# Patient Record
Sex: Male | Born: 1965 | Race: Asian | Hispanic: No | Smoking: Never smoker
Health system: Southern US, Community
[De-identification: ages and names within clinical notes are randomized; demographics above are authoritative.]

## PROBLEM LIST (undated history)

## (undated) DIAGNOSIS — R109 Unspecified abdominal pain: Secondary | ICD-10-CM

## (undated) DIAGNOSIS — R918 Other nonspecific abnormal finding of lung field: Secondary | ICD-10-CM

## (undated) DIAGNOSIS — R0602 Shortness of breath: Secondary | ICD-10-CM

## (undated) DIAGNOSIS — K921 Melena: Secondary | ICD-10-CM

## (undated) DIAGNOSIS — R358 Other polyuria: Secondary | ICD-10-CM

## (undated) DIAGNOSIS — E785 Hyperlipidemia, unspecified: Secondary | ICD-10-CM

## (undated) DIAGNOSIS — E1165 Type 2 diabetes mellitus with hyperglycemia: Secondary | ICD-10-CM

## (undated) DIAGNOSIS — T7840XA Allergy, unspecified, initial encounter: Secondary | ICD-10-CM

## (undated) DIAGNOSIS — Z0000 Encounter for general adult medical examination without abnormal findings: Secondary | ICD-10-CM

## (undated) DIAGNOSIS — C3490 Malignant neoplasm of unspecified part of unspecified bronchus or lung: Secondary | ICD-10-CM

## (undated) DIAGNOSIS — J309 Allergic rhinitis, unspecified: Secondary | ICD-10-CM

## (undated) DIAGNOSIS — R197 Diarrhea, unspecified: Secondary | ICD-10-CM

## (undated) DIAGNOSIS — K759 Inflammatory liver disease, unspecified: Secondary | ICD-10-CM

## (undated) DIAGNOSIS — M79609 Pain in unspecified limb: Secondary | ICD-10-CM

## (undated) DIAGNOSIS — A159 Respiratory tuberculosis unspecified: Secondary | ICD-10-CM

## (undated) DIAGNOSIS — K635 Polyp of colon: Secondary | ICD-10-CM

## (undated) DIAGNOSIS — M1710 Unilateral primary osteoarthritis, unspecified knee: Secondary | ICD-10-CM

## (undated) DIAGNOSIS — Z8619 Personal history of other infectious and parasitic diseases: Secondary | ICD-10-CM

## (undated) HISTORY — PX: COLONOSCOPY: SHX174

## (undated) HISTORY — DX: Unilateral primary osteoarthritis, unspecified knee: M17.10

## (undated) HISTORY — DX: Allergy, unspecified, initial encounter: T78.40XA

## (undated) HISTORY — DX: Malignant neoplasm of unspecified part of unspecified bronchus or lung: C34.90

## (undated) HISTORY — DX: Personal history of other infectious and parasitic diseases: Z86.19

## (undated) HISTORY — DX: Melena: K92.1

## (undated) HISTORY — PX: ESOPHAGOGASTRODUODENOSCOPY: SHX1529

## (undated) HISTORY — DX: Hyperlipidemia, unspecified: E78.5

## (undated) HISTORY — DX: Polyp of colon: K63.5

## (undated) HISTORY — DX: Unspecified abdominal pain: R10.9

## (undated) HISTORY — DX: Encounter for general adult medical examination without abnormal findings: Z00.00

## (undated) HISTORY — DX: Diarrhea, unspecified: R19.7

## (undated) HISTORY — PX: UPPER GASTROINTESTINAL ENDOSCOPY: SHX188

## (undated) HISTORY — PX: POLYPECTOMY: SHX149

## (undated) HISTORY — DX: Allergic rhinitis, unspecified: J30.9

## (undated) HISTORY — DX: Other polyuria: R35.8

## (undated) HISTORY — DX: Type 2 diabetes mellitus with hyperglycemia: E11.65

## (undated) HISTORY — DX: Pain in unspecified limb: M79.609

---

## 2006-03-14 ENCOUNTER — Ambulatory Visit: Payer: Self-pay | Admitting: Internal Medicine

## 2009-02-17 ENCOUNTER — Ambulatory Visit: Payer: Self-pay | Admitting: Internal Medicine

## 2009-02-17 DIAGNOSIS — E785 Hyperlipidemia, unspecified: Secondary | ICD-10-CM

## 2009-02-17 DIAGNOSIS — K921 Melena: Secondary | ICD-10-CM | POA: Insufficient documentation

## 2009-02-17 DIAGNOSIS — R7302 Impaired glucose tolerance (oral): Secondary | ICD-10-CM

## 2009-02-17 DIAGNOSIS — M79609 Pain in unspecified limb: Secondary | ICD-10-CM | POA: Insufficient documentation

## 2009-02-17 DIAGNOSIS — E119 Type 2 diabetes mellitus without complications: Secondary | ICD-10-CM | POA: Insufficient documentation

## 2009-02-17 DIAGNOSIS — R197 Diarrhea, unspecified: Secondary | ICD-10-CM

## 2009-02-17 DIAGNOSIS — IMO0001 Reserved for inherently not codable concepts without codable children: Secondary | ICD-10-CM

## 2009-02-17 HISTORY — DX: Diarrhea, unspecified: R19.7

## 2009-02-17 HISTORY — DX: Reserved for inherently not codable concepts without codable children: IMO0001

## 2009-02-17 HISTORY — DX: Melena: K92.1

## 2009-02-17 HISTORY — DX: Pain in unspecified limb: M79.609

## 2009-02-17 HISTORY — DX: Hyperlipidemia, unspecified: E78.5

## 2009-02-17 LAB — CONVERTED CEMR LAB
ALT: 25 units/L (ref 0–53)
AST: 20 units/L (ref 0–37)
Alkaline Phosphatase: 89 units/L (ref 39–117)
BUN: 17 mg/dL (ref 6–23)
Basophils Relative: 0.9 % (ref 0.0–3.0)
Bilirubin, Direct: 0.1 mg/dL (ref 0.0–0.3)
Creatinine, Ser: 0.9 mg/dL (ref 0.4–1.5)
Eosinophils Relative: 0.6 % (ref 0.0–5.0)
HDL: 53.3 mg/dL (ref >39.00)
INR: 1 (ref 0.8–1.0)
Ketones, ur: NEGATIVE mg/dL
Leukocytes, UA: NEGATIVE
Lymphocytes Relative: 33.3 % (ref 12.0–46.0)
Monocytes Relative: 4.7 % (ref 3.0–12.0)
Neutrophils Relative %: 60.5 % (ref 43.0–77.0)
PSA: 0.49 ng/mL (ref 0.10–4.00)
RBC: 5 M/uL (ref 4.22–5.81)
Specific Gravity, Urine: >=1.03 (ref 1.000–1.030)
TSH: 0.46 microintl units/mL (ref 0.35–5.50)
Total Protein: 7.9 g/dL (ref 6.0–8.3)
Urobilinogen, UA: 0.2 (ref 0.0–1.0)
WBC: 5.4 10*3/uL (ref 4.5–10.5)

## 2009-02-28 ENCOUNTER — Ambulatory Visit: Payer: Self-pay | Admitting: Gastroenterology

## 2009-03-31 ENCOUNTER — Telehealth (INDEPENDENT_AMBULATORY_CARE_PROVIDER_SITE_OTHER): Payer: Self-pay | Admitting: *Deleted

## 2009-04-04 ENCOUNTER — Encounter: Payer: Self-pay | Admitting: Gastroenterology

## 2009-06-09 ENCOUNTER — Telehealth: Payer: Self-pay | Admitting: Internal Medicine

## 2010-03-24 ENCOUNTER — Ambulatory Visit: Payer: Self-pay | Admitting: Internal Medicine

## 2010-03-24 DIAGNOSIS — R358 Other polyuria: Secondary | ICD-10-CM

## 2010-03-24 DIAGNOSIS — R3589 Other polyuria: Secondary | ICD-10-CM

## 2010-03-24 HISTORY — DX: Other polyuria: R35.89

## 2010-03-25 ENCOUNTER — Encounter: Payer: Self-pay | Admitting: Internal Medicine

## 2010-03-25 LAB — CONVERTED CEMR LAB
Calcium: 9.2 mg/dL (ref 8.4–10.5)
Cholesterol: 264 mg/dL — ABNORMAL HIGH (ref 0–200)
GFR calc non Af Amer: 75.84 mL/min (ref >60)
Hemoglobin, Urine: NEGATIVE
Hgb A1c MFr Bld: 6.1 % (ref 4.6–6.5)
Sodium: 143 meq/L (ref 135–145)
Specific Gravity, Urine: >=1.03 (ref 1.000–1.030)
Total Protein, Urine: NEGATIVE mg/dL
Triglycerides: 324 mg/dL — ABNORMAL HIGH (ref 0.0–149.0)
Urine Glucose: 100 mg/dL

## 2010-03-31 ENCOUNTER — Telehealth: Payer: Self-pay | Admitting: Internal Medicine

## 2010-04-24 ENCOUNTER — Ambulatory Visit: Payer: Self-pay | Admitting: Internal Medicine

## 2010-05-14 ENCOUNTER — Encounter: Payer: Self-pay | Admitting: Internal Medicine

## 2010-05-14 ENCOUNTER — Ambulatory Visit: Payer: Self-pay | Admitting: Internal Medicine

## 2010-05-14 DIAGNOSIS — M1710 Unilateral primary osteoarthritis, unspecified knee: Secondary | ICD-10-CM

## 2010-05-14 DIAGNOSIS — IMO0002 Reserved for concepts with insufficient information to code with codable children: Secondary | ICD-10-CM

## 2010-05-14 HISTORY — DX: Reserved for concepts with insufficient information to code with codable children: IMO0002

## 2010-05-19 ENCOUNTER — Telehealth: Payer: Self-pay | Admitting: Internal Medicine

## 2010-06-10 ENCOUNTER — Ambulatory Visit: Admit: 2010-06-10 | Payer: Self-pay | Admitting: Internal Medicine

## 2010-07-07 NOTE — Progress Notes (Signed)
  Phone Note Call from Patient   Caller: Patient---[phone] Call For: Corwin Levins MD Summary of Call: Pt calling to let Dr Jonny Ruiz know about his sugar. 85-95 range for blood sugar before meals, after meals about 140. Does pt need to go to diabetic education? Please advise. Initial call taken by: Verdell Face,  March 31, 2010 11:45 AM  Follow-up for Phone Call        I think it would be fine as he does have mild glucose intolerance with mealtimes;  to help with this, I can given him very low dose januvia at 25 mg, since this would help with his kind of elevated sugars, and does not cause low sugars  done hardcopy to LIM side B - dahlia  Follow-up by: Corwin Levins MD,  March 31, 2010 1:09 PM  Additional Follow-up for Phone Call Additional follow up Details #1::        left message on machine for pt to return my call. Margaret Pyle, CMA  March 31, 2010 1:51 PM  Pt called back and advised of pharmacy. Rx faxed. Additional Follow-up by: Margaret Pyle, CMA,  March 31, 2010 3:11 PM    New/Updated Medications: JANUVIA 25 MG TABS (SITAGLIPTIN PHOSPHATE) 1 by mouth once daily Prescriptions: JANUVIA 25 MG TABS (SITAGLIPTIN PHOSPHATE) 1 by mouth once daily  #90 x 3   Entered and Authorized by:   Corwin Levins MD   Signed by:   Corwin Levins MD on 03/31/2010   Method used:   Print then Give to Patient   RxID:   [id number]

## 2010-07-07 NOTE — Letter (Signed)
Summary: [facility]  [facility]   Imported By: Lester Lucerne 04/01/2010 10:25:14  _____________________________________________________________________  External Attachment:    Type:   Image     Comment:   External Document

## 2010-07-07 NOTE — Progress Notes (Signed)
Summary: RESULTS  Phone Note Call from Patient Call back at Home Phone [phone] Call back at [phone]    Summary of Call: Patient is requesting results of labs from september.  Initial call taken by: Lamar Sprinkles, CMA,  June 09, 2009 4:39 PM  Follow-up for Phone Call        pt informed that labs were all normal and he should follow lower chol diet Follow-up by: Margaret Pyle, CMA,  June 09, 2009 4:42 PM

## 2010-07-07 NOTE — Assessment & Plan Note (Signed)
Summary: glucose 168/frequent urination-lb   Vital Signs:  Patient profile:   45 year old male Height:      63 inches Weight:      149.38 pounds BMI:     26.56 O2 Sat:      95 % on Room air Temp:     98.7 degrees F oral Pulse rate:   102 / minute BP sitting:   102 / 60  (left arm) Cuff size:   regular  Vitals Entered By: Zella Ball Ewing CMA Duncan Dull) (March 24, 2010 1:48 PM)  O2 Flow:  Room air CC: BS elevated, urinating more frequently/RE CBG Result 180   Primary Care Provider:  Sheryle Spray  CC:  BS elevated and urinating more frequently/RE.  History of Present Illness: here with new onset polydipsia, polyuria for 2 wks, cbg this am 180, and last wk with 168 by a realtive's glucometer;  no obvious source of etiology for worsening glucose elevation such as CP, or fever , ST, cough, dysuria, freq, urgency, hematuria.  Pt denies CP, worsening sob, doe, wheezing, orthopnea, pnd, worsening LE edema, palps, dizziness or syncope  Pt denies new neuro symptoms such as headache, facial or extremity weakness  No fever, wt loss, night sweats, loss of appetite or other constitutional symptoms  Pt denies low sugar symptoms such as shakiness improved with eating.  Overall good compliance with meds, trying to follow low chol, DM diet, wt stable, little excercise however .  Had significant nocturia up to 10 times per night, last nigth only 3. No hx of prostatitis  Problems Prior to Update: 1)  Polyuria  (ICD-788.42) 2)  Diabetes Mellitus, Uncontrolled  (ICD-250.02) 3)  Hyperlipidemia  (ICD-272.4) 4)  Leg Pain, Bilateral  (ICD-729.5) 5)  Hematochezia  (ICD-578.1) 6)  Diarrhea  (ICD-787.91) 7)  Preventive Health Care  (ICD-V70.0)  Medications Prior to Update: 1)  Flexeril 5 Mg Tabs (Cyclobenzaprine Hcl) .Marland Kitchen.. 1 By Mouth Three Times A Day As Needed  Current Medications (verified): 1)  Flexeril 5 Mg Tabs (Cyclobenzaprine Hcl) .Marland Kitchen.. 1 By Mouth Three Times A Day As Needed 2)  Janumet 50-500 Mg Tabs  (Sitagliptin-Metformin Hcl) .Marland Kitchen.. 1po Two Times A Day 3)  Aspir-Low 81 Mg Tbec (Aspirin) .Marland Kitchen.. 1po Once Daily 4)  Onetouch Test  Strp (Glucose Blood) .... Use Asd Two Times A Day 5)  Lancets  Misc (Lancets) .... Use Asd Two Times A Day  Allergies (verified): No Known Drug Allergies  Past History:  Past Medical History: Last updated: 02/28/2009 Hyperlipidemia glucose intolerance hx of HepBcAb positive   Past Surgical History: Last updated: 02/28/2009 Denies surgical history   Social History: Last updated: 02/28/2009 Married 4 children work - Chartered certified accountant  - Administrator Never Smoked Alcohol use-yes - rare   Risk Factors: Smoking Status: never (02/17/2009)  Review of Systems       all otherwise negative per pt -    Physical Exam  General:  alert and well-developed.   Head:  normocephalic and atraumatic.   Eyes:  vision grossly intact, pupils equal, and pupils round.   Ears:  R ear normal and L ear normal.   Nose:  no external deformity and no nasal discharge.   Mouth:  no gingival abnormalities and pharynx pink and moist.   Neck:  supple and no masses.   Lungs:  normal respiratory effort and normal breath sounds.   Heart:  normal rate and regular rhythm.   Abdomen:  soft, non-tender, and normal bowel sounds.  Extremities:  no edema, no erythema    Impression & Recommendations:  Problem # 1:  DIABETES MELLITUS, UNCONTROLLED (ICD-250.02)  His updated medication list for this problem includes:    Janumet 50-500 Mg Tabs (Sitagliptin-metformin hcl) .Marland Kitchen... 1po two times a day    Aspir-low 81 Mg Tbec (Aspirin) .Marland Kitchen... 1po once daily uncontrolled, new;  for glucometer/ supplies, refer dietary, treat as above, f/u any worsening signs or symptoms ,  call in 1 wk with cbg's;  f/u dec 2011 with labs as planned, start asa 81 mg once daily   Orders: Diabetic Clinic Referral (Diabetic) TLB-BMP (Basic Metabolic Panel-BMET) (80048-METABOL) TLB-A1C / Hgb A1C (Glycohemoglobin)  (83036-A1C) TLB-Lipid Panel (80061-LIPID)  Problem # 2:  POLYURIA (HWE-993.71) to also check urine studies to r/o underlying UTI as cause of elev sugars, consider urology if neg and symptoms persist Orders: T-Culture, Urine ([id number]) TLB-Udip w/ Micro (81001-URINE)  Problem # 3:  HYPERLIPIDEMIA (ICD-272.4)  stable overall by hx and exam, ok to continue meds/tx as is , Pt to cont DM diet, excercise, wt control efforts; to check labs today   Labs Reviewed: SGOT: 20 (02/17/2009)   SGPT: 25 (02/17/2009)   HDL:53.30 (02/17/2009)  Chol:280 (02/17/2009)  Trig:363.0 (02/17/2009)  Complete Medication List: 1)  Flexeril 5 Mg Tabs (Cyclobenzaprine hcl) .Marland Kitchen.. 1 by mouth three times a day as needed 2)  Janumet 50-500 Mg Tabs (Sitagliptin-metformin hcl) .Marland Kitchen.. 1po two times a day 3)  Aspir-low 81 Mg Tbec (Aspirin) .Marland Kitchen.. 1po once daily 4)  Onetouch Test Strp (Glucose blood) .... Use asd two times a day 5)  Lancets Misc (Lancets) .... Use asd two times a day  Other Orders: Glucose, (CBG) [id number])  Patient Instructions: 1)  start the janumet 50/500 two times a day  2)  Please go to the Lab in the basement for your blood and/or urine tests today  3)  check your sugar two times a day for the next wk (fasting in the AM, and before dinner later) and call with sugar readings in 1 wk 4)  Continue all previous medications as before this visit  5)  Please schedule a follow-up appointment  - in May 14, 2010 as planned with CPX labs and: 6)  HbgA1C prior to visit, ICD-9: 250.02 7)  Urine Microalbumin prior to visit, ICD-9: 8)  Take an Aspirin every day - 81 mg - 1 per day - COATED only - to reduce risk of heart attack and stroke 9)  You will be contacted about the referral(s) to: [facility] 10)  You are given the glucometer and rx for  supplies today Prescriptions: LANCETS  MISC (LANCETS) use asd two times a day  #200 x 11   Entered and Authorized by:   Corwin Levins MD   Signed by:   Corwin Levins MD on  03/24/2010   Method used:   Print then Give to Patient   RxID:   [id number] [facility] TEST  STRP (GLUCOSE BLOOD) use asd two times a day  #200 x 11   Entered and Authorized by:   Corwin Levins MD   Signed by:   Corwin Levins MD on 03/24/2010   Method used:   Print then Give to Patient   RxID:   [id number] JANUMET 50-500 MG TABS (SITAGLIPTIN-METFORMIN HCL) 1po two times a day  #60 x 11   Entered and Authorized by:   Corwin Levins MD   Signed by:   Corwin Levins MD on 03/24/2010  Method used:   Print then Give to Patient   RxID:   [id number]    Orders Added: 1)  Glucose, (CBG) [82962] 2)  Diabetic Clinic Referral [Diabetic] 3)  T-Culture, Urine [[id number]] 4)  TLB-Udip w/ Micro [81001-URINE] 5)  TLB-BMP (Basic Metabolic Panel-BMET) [80048-METABOL] 6)  TLB-A1C / Hgb A1C (Glycohemoglobin) [83036-A1C] 7)  TLB-Lipid Panel [80061-LIPID] 8)  Est. Patient Level IV [30865]    Laboratory Results   Blood Tests     CBG Random:: 180mg /dL

## 2010-07-07 NOTE — Assessment & Plan Note (Signed)
Summary: CPX/#/cd   Vital Signs:  Patient profile:   45 year old male Height:      63 inches Weight:      148.50 pounds BMI:     26.40 O2 Sat:      99 % on Room air Temp:     99.2 degrees F oral Pulse rate:   67 / minute BP sitting:   100 / 62  (left arm) Cuff size:   large  Vitals Entered By: Zella Ball Ewing CMA Duncan Dull) (May 14, 2010 10:19 AM)  O2 Flow:  Room air  CC: Adult Physical/RE   Primary Care Provider:  Corwin Levins MD  CC:  Adult Physical/RE.  History of Present Illness:  here for wellness and f/u;  overall doing ok,  Pt denies CP, worsening sob, doe, wheezing, orthopnea, pnd, worsening LE edema, palps, dizziness or syncope  Pt denies new neuro symptoms such as headache, facial or extremity weakness  Pt denies polydipsia, polyuria, or low sugar symptoms such as shakiness improved with eating.  Overall good compliance with meds, trying to follow low chol, DM diet, wt stable, little excercise however  Denies worsening depressive symptoms, suicidal ideation, or panic.   No fever, wt loss, night sweats, loss of appetite or other constitutional symptoms  Overall good compliance with meds, and good tolerability.  Pt states good ability with ADL's, low fall risk, home safety reviewed and adequate, no significant change in hearing or vision, trying to follow lower chol diet, and occasionally active only with regular excercise.   Preventive Screening-Counseling & Management      Drug Use:  no.    Problems Prior to Update: 1)  Osteoarthritis, Knees, Bilateral  (ICD-715.96) 2)  Polyuria  (ICD-788.42) 3)  Diabetes Mellitus, Uncontrolled  (ICD-250.02) 4)  Hyperlipidemia  (ICD-272.4) 5)  Leg Pain, Bilateral  (ICD-729.5) 6)  Hematochezia  (ICD-578.1) 7)  Diarrhea  (ICD-787.91) 8)  Preventive Health Care  (ICD-V70.0)  Medications Prior to Update: 1)  Flexeril 5 Mg Tabs (Cyclobenzaprine Hcl) .Marland Kitchen.. 1 By Mouth Three Times A Day As Needed 2)  Januvia 25 Mg Tabs (Sitagliptin  Phosphate) .Marland Kitchen.. 1 By Mouth Once Daily 3)  Aspir-Low 81 Mg Tbec (Aspirin) .Marland Kitchen.. 1po Once Daily 4)  Onetouch Test  Strp (Glucose Blood) .... Use Asd Two Times A Day 5)  Lancets  Misc (Lancets) .... Use Asd Two Times A Day  Current Medications (verified): 1)  Aspir-Low 81 Mg Tbec (Aspirin) .Marland Kitchen.. 1po Once Daily 2)  Onetouch Test  Strp (Glucose Blood) .... Use Asd Two Times A Day 3)  Lancets  Misc (Lancets) .... Use Asd Two Times A Day 4)  Lipitor 10 Mg Tabs (Atorvastatin Calcium) .Marland Kitchen.. 1 By Mouth Once Daily  - Generic  Allergies (verified): No Known Drug Allergies  Past History:  Past Surgical History: Last updated: 02/28/2009 Denies surgical history   Family History: Last updated: 02/28/2009 FH largely unknown as parents killed by [facility] in [place] when he was 45yo   Social History: Last updated: 05/14/2010 Married 4 children work - Chartered certified accountant  - Administrator Never Smoked Alcohol use-yes - rare  Drug use-no  Risk Factors: Smoking Status: never (02/17/2009)  Past Medical History: Hyperlipidemia glucose intolerance hx of HepBcAb positive  bilateral knee DJD - minor, 2011  Social History: Married 4 children work - Chartered certified accountant  - Administrator Never Smoked Alcohol use-yes - rare  Drug use-no Drug Use:  no  Review of Systems  The patient denies anorexia,  fever, vision loss, decreased hearing, hoarseness, chest pain, syncope, dyspnea on exertion, peripheral edema, prolonged cough, headaches, hemoptysis, abdominal pain, melena, hematochezia, severe indigestion/heartburn, hematuria, muscle weakness, suspicious skin lesions, transient blindness, difficulty walking, depression, unusual weight change, abnormal bleeding, enlarged lymph nodes, and angioedema.         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   Head:  normocephalic and atraumatic.    Eyes:  vision grossly intact, pupils equal, and pupils round.   Ears:  R ear normal and L ear normal.   Nose:  no external deformity and no nasal discharge.   Mouth:  no gingival abnormalities and pharynx pink and moist.   Neck:  supple and no masses.   Lungs:  normal respiratory effort and normal breath sounds.   Heart:  normal rate and regular rhythm.   Abdomen:  soft, non-tender, and normal bowel sounds.   Msk:  no joint tenderness and no joint swelling.  , has bilat mild knee crepitus Extremities:  no edema, no erythema  Neurologic:  cranial nerves II-XII intact and strength normal in all extremities.   Skin:  color normal and no suspicious lesions.   Psych:  not depressed appearing and slightly anxious.     Impression & Recommendations:  Problem # 1:  Preventive Health Care (ICD-V70.0) Overall doing well, age appropriate education and counseling updated, referral for preventive services and immunizations addressed, dietary counseling and smoking status adressed , most recent labs reviewed, ecg reviewed I have personally reviewed and have noted 1.The patient's medical and social history 2.Their use of alcohol, tobacco or illicit drugs 3.Their current medications and supplements 4. Functional ability including ADL's, fall risk, home safety risk, hearing & visual impairment  5.Diet and physical activities 6.Evidence for depression or mood disorders The patients weight, height, BMI  have been recorded in the chart I have made referrals, counseling and provided education to the patient based review of the above  To check further labs with next  blood draw Orders: EKG w/ Interpretation (93000)  Problem # 2:  OSTEOARTHRITIS, KNEES, BILATERAL (ICD-715.96)  The following medications were removed from the medication list:    Flexeril 5 Mg Tabs (Cyclobenzaprine hcl) .Marland Kitchen... 1 by mouth three times a day as needed His updated medication list for this problem includes:    Aspir-low 81 Mg  Tbec (Aspirin) .Marland Kitchen... 1po once daily minor, for tylenol arthritis as needed   Problem # 3:  DIABETES MELLITUS, UNCONTROLLED (ICD-250.02) Assessment: Unchanged  The following medications were removed from the medication list:    Januvia 25 Mg Tabs (Sitagliptin phosphate) .Marland Kitchen... 1 by mouth once daily His updated medication list for this problem includes:    Aspir-low 81 Mg Tbec (Aspirin) .Marland Kitchen... 1po once daily no need for OHA at this time, pt educated and reassured  Labs Reviewed: Creat: 1.1 (03/24/2010)    Reviewed HgBA1c results: 6.1 (03/24/2010)  Problem # 4:  HYPERLIPIDEMIA (ICD-272.4) Assessment: Deteriorated  His updated medication list for this problem includes:    Lipitor 10 Mg Tabs (Atorvastatin calcium) .Marland Kitchen... 1 by mouth once daily  - generic  Labs Reviewed: SGOT: 20 (02/17/2009)   SGPT: 25 (02/17/2009)   HDL:55.10 (03/24/2010), 53.30 (02/17/2009)  Chol:264 (03/24/2010), 280 (02/17/2009)  Trig:324.0 (03/24/2010), 363.0 (02/17/2009) start med as above, for f/u labs 4 wks  Complete Medication List: 1)  Aspir-low 81  Mg Tbec (Aspirin) .Marland Kitchen.. 1po once daily 2)  Onetouch Test Strp (Glucose blood) .... Use asd two times a day 3)  Lancets Misc (Lancets) .... Use asd two times a day 4)  Lipitor 10 Mg Tabs (Atorvastatin calcium) .Marland Kitchen.. 1 by mouth once daily  - generic  Patient Instructions: 1)  please return in 4 wks for LAB only: 2)  Hepatic Panel  ICD-9: v58.69 3)  Lipid Panel , ICD-9: 272.0 4)  PSA , ICD-9: v70.0 5)  TSH, ICD-9:v70.0 6)  CBC w/ Diff , ICD-9: v70.0 7)  start the lipitor generic at 10mg  per day (sent to your pharmacy) 8)  Continue all previous medications as before this visit  9)  You can also take Tylenol arthritis (1 or 2 pills every 8 hrs as needed pain such as at work) 10)  Please schedule a follow-up appointment in 6 months with: 11)  BMP prior to visit, ICD-9: 250.02 12)  Lipid Panel prior to visit, ICD-9: 13)  HbgA1C prior to visit, ICD-9: 14)  Your Blood  Pressure today was 100/62, heart rate 67, wt 148.8 Prescriptions: LIPITOR 10 MG TABS (ATORVASTATIN CALCIUM) 1 by mouth once daily  - generic  #90 x 3   Entered and Authorized by:   Corwin Levins MD   Signed by:   Corwin Levins MD on 05/14/2010   Method used:   Electronically to        [facility] [id number]* (retail)       [address]       [place], [place]  [address]       Ph: [phone]       Fax: [phone]   RxID:   [id number]    Orders Added: 1)  EKG w/ Interpretation [93000] 2)  Est. Patient 40-64 years [phone]

## 2010-07-09 NOTE — Progress Notes (Signed)
Summary: Diabetic referral  Phone Note Outgoing Call   Summary of Call: Per [facility] pt called and cancelled appt and elected not to reschedule. Initial call taken by: Dagoberto Reef,  May 19, 2010 2:47 PM  Follow-up for Phone Call        noted Follow-up by: Corwin Levins MD,  May 19, 2010 3:39 PM

## 2010-11-13 ENCOUNTER — Other Ambulatory Visit: Payer: Self-pay

## 2010-11-13 ENCOUNTER — Other Ambulatory Visit: Payer: Self-pay | Admitting: Internal Medicine

## 2010-11-13 DIAGNOSIS — I10 Essential (primary) hypertension: Secondary | ICD-10-CM

## 2010-11-13 DIAGNOSIS — T887XXA Unspecified adverse effect of drug or medicament, initial encounter: Secondary | ICD-10-CM

## 2010-11-15 ENCOUNTER — Encounter: Payer: Self-pay | Admitting: Internal Medicine

## 2010-11-15 DIAGNOSIS — Z0001 Encounter for general adult medical examination with abnormal findings: Secondary | ICD-10-CM | POA: Insufficient documentation

## 2010-11-15 DIAGNOSIS — Z0000 Encounter for general adult medical examination without abnormal findings: Secondary | ICD-10-CM

## 2010-11-15 HISTORY — DX: Encounter for general adult medical examination without abnormal findings: Z00.00

## 2010-11-17 ENCOUNTER — Other Ambulatory Visit (INDEPENDENT_AMBULATORY_CARE_PROVIDER_SITE_OTHER): Payer: Self-pay

## 2010-11-17 ENCOUNTER — Other Ambulatory Visit: Payer: Self-pay | Admitting: Internal Medicine

## 2010-11-17 DIAGNOSIS — I10 Essential (primary) hypertension: Secondary | ICD-10-CM

## 2010-11-17 DIAGNOSIS — T887XXA Unspecified adverse effect of drug or medicament, initial encounter: Secondary | ICD-10-CM

## 2010-11-17 LAB — HEPATIC FUNCTION PANEL
ALT: 22 U/L (ref 0–53)
Bilirubin, Direct: 0 mg/dL (ref 0.0–0.3)
Total Bilirubin: 0.4 mg/dL (ref 0.3–1.2)

## 2010-11-17 LAB — BASIC METABOLIC PANEL
Calcium: 9.4 mg/dL (ref 8.4–10.5)
Chloride: 102 mEq/L (ref 96–112)
Creatinine, Ser: 1 mg/dL (ref 0.4–1.5)
Sodium: 136 mEq/L (ref 135–145)

## 2010-11-17 LAB — CBC WITH DIFFERENTIAL/PLATELET
Eosinophils Relative: 1.6 % (ref 0.0–5.0)
Lymphocytes Relative: 38.6 % (ref 12.0–46.0)
Monocytes Relative: 5.5 % (ref 3.0–12.0)
Neutrophils Relative %: 54 % (ref 43.0–77.0)
Platelets: 273 10*3/uL (ref 150.0–400.0)
WBC: 5.9 10*3/uL (ref 4.5–10.5)

## 2010-11-17 LAB — LIPID PANEL
HDL: 54 mg/dL (ref >39.00)
Total CHOL/HDL Ratio: 5
Triglycerides: 318 mg/dL — ABNORMAL HIGH (ref 0.0–149.0)

## 2010-11-17 LAB — HEMOGLOBIN A1C: Hgb A1c MFr Bld: 6.3 % (ref 4.6–6.5)

## 2010-11-17 LAB — LDL CHOLESTEROL, DIRECT: Direct LDL: 146.3 mg/dL

## 2010-11-20 ENCOUNTER — Encounter: Payer: Self-pay | Admitting: Internal Medicine

## 2010-11-20 ENCOUNTER — Ambulatory Visit (INDEPENDENT_AMBULATORY_CARE_PROVIDER_SITE_OTHER): Payer: PRIVATE HEALTH INSURANCE | Admitting: Internal Medicine

## 2010-11-20 VITALS — BP 102/68 | HR 76 | Temp 98.7°F | Ht 63.0 in | Wt 149.2 lb

## 2010-11-20 DIAGNOSIS — E785 Hyperlipidemia, unspecified: Secondary | ICD-10-CM

## 2010-11-20 DIAGNOSIS — Z0000 Encounter for general adult medical examination without abnormal findings: Secondary | ICD-10-CM

## 2010-11-20 MED ORDER — LANCETS MISC: Status: DC

## 2010-11-20 MED ORDER — GLUCOSE BLOOD VI STRP: ORAL_STRIP | Status: DC

## 2010-11-20 MED ORDER — SIMVASTATIN 20 MG PO TABS: 20.0000 mg | ORAL_TABLET | Freq: Every evening | ORAL | Status: DC

## 2010-11-20 NOTE — Assessment & Plan Note (Signed)
stable overall by hx and exam, most recent data reviewed with pt, and pt to continue medical treatment as before  Lab Results  Component Value Date   HGBA1C 6.3 11/17/2010   No OHA needed at this time, cont diet, for new glucometer today and rx for supplies

## 2010-11-20 NOTE — Patient Instructions (Signed)
You are given the new glucometer machine today Stop the lipitor as you have Start the generic for zocor 20 mg Continue all other medications as before Please return in 6 mo with Lab testing done 3-5 days before

## 2010-11-20 NOTE — Assessment & Plan Note (Signed)
To d/c the lipitor, though not clear it was the etiology for the chest crampy pains;  Ok to change to zocor asd, f/u labs

## 2010-11-20 NOTE — Assessment & Plan Note (Signed)

## 2010-11-20 NOTE — Progress Notes (Signed)
Subjective:    Patient ID: Nicolas Cantu, male    DOB: 1966/06/05, 45 y.o.   MRN: [id number]  HPI  Here for wellness and f/u;  Overall doing ok;  Pt denies CP, worsening SOB, DOE, wheezing, orthopnea, PND, worsening LE edema, palpitations, dizziness or syncope.  Pt denies neurological change such as new Headache, facial or extremity weakness.  Pt denies polydipsia, polyuria, or low sugar symptoms. Pt states overall good compliance with treatment and medications, good tolerability, and trying to follow lower cholesterol diet.  Pt denies worsening depressive symptoms, suicidal ideation or panic. No fever, wt loss, night sweats, loss of appetite, or other constitutional symptoms.  Pt states good ability with ADL's, low fall risk, home safety reviewed and adequate, no significant changes in hearing or vision, and occasionally active with exercise.  Did stop his lipitor 3 wks ago after having crampy lateral sharp chest discomfort at work - works as Location manager. Past Medical History  Diagnosis Date  . DIABETES MELLITUS, UNCONTROLLED 02/17/2009  . Diarrhea 02/17/2009  . HEMATOCHEZIA 02/17/2009  . HYPERLIPIDEMIA 02/17/2009  . LEG PAIN, BILATERAL 02/17/2009  . OSTEOARTHRITIS, KNEES, BILATERAL 05/14/2010  . Polyuria 03/24/2010  . Preventative health care 11/15/2010   No past surgical history on file.  reports that he has never smoked. He does not have any smokeless tobacco history on file. He reports that he drinks alcohol. He reports that he does not use illicit drugs. family history is not on file. No Known Allergies Current Outpatient Prescriptions on File Prior to Visit  Medication Sig Dispense Refill  . aspirin 81 MG EC tablet Take 81 mg by mouth daily.        Marland Kitchen DISCONTD: atorvastatin (LIPITOR) 10 MG tablet Take 10 mg by mouth daily.        Marland Kitchen DISCONTD: glucose blood (ONE TOUCH TEST STRIPS) test strip Use as directed two times a day       . DISCONTD: Lancets MISC Test as directed two times a day          Review of Systems Review of Systems  Constitutional: Negative for diaphoresis, activity change, appetite change and unexpected weight change.  HENT: Negative for hearing loss, ear pain, facial swelling, mouth sores and neck stiffness.   Eyes: Negative for pain, redness and visual disturbance.  Respiratory: Negative for shortness of breath and wheezing.   Cardiovascular: Negative for chest pain and palpitations.  Gastrointestinal: Negative for diarrhea, blood in stool, abdominal distention and rectal pain.  Genitourinary: Negative for hematuria, flank pain and decreased urine volume.  Musculoskeletal: Negative for myalgias and joint swelling.  Skin: Negative for color change and wound.  Neurological: Negative for syncope and numbness.  Hematological: Negative for adenopathy.  Psychiatric/Behavioral: Negative for hallucinations, self-injury, decreased concentration and agitation.      Objective:   Physical Exam BP 102/68  Pulse 76  Temp(Src) 98.7 F (37.1 C) (Oral)  Ht 5\' 3"  (1.6 m)  Wt 149 lb 4 oz (67.699 kg)  BMI 26.44 kg/m2  SpO2 96% Physical Exam  VS noted Constitutional: Pt is oriented to person, place, and time. Appears well-developed and well-nourished.  HENT:  Head: Normocephalic and atraumatic.  Right Ear: External ear normal.  Left Ear: External ear normal.  Nose: Nose normal.  Mouth/Throat: Oropharynx is clear and moist.  Eyes: Conjunctivae and EOM are normal. Pupils are equal, round, and reactive to light.  Neck: Normal range of motion. Neck supple. No JVD present. No tracheal deviation present.  Cardiovascular:  Normal rate, regular rhythm, normal heart sounds and intact distal pulses.   Pulmonary/Chest: Effort normal and breath sounds normal.  Abdominal: Soft. Bowel sounds are normal. There is no tenderness.  Musculoskeletal: Normal range of motion. Exhibits no edema.  Lymphadenopathy:  Has no cervical adenopathy.  Neurological: Pt is alert and oriented to  person, place, and time. Pt has normal reflexes. No cranial nerve deficit.  Skin: Skin is warm and dry. No rash noted.  Psychiatric:  Has  normal mood and affect. Behavior is normal.         Assessment & Plan:

## 2011-01-07 ENCOUNTER — Encounter: Payer: Self-pay | Admitting: Internal Medicine

## 2011-01-07 ENCOUNTER — Ambulatory Visit (INDEPENDENT_AMBULATORY_CARE_PROVIDER_SITE_OTHER): Payer: PRIVATE HEALTH INSURANCE | Admitting: Internal Medicine

## 2011-01-07 DIAGNOSIS — J209 Acute bronchitis, unspecified: Secondary | ICD-10-CM

## 2011-01-07 DIAGNOSIS — R062 Wheezing: Secondary | ICD-10-CM | POA: Insufficient documentation

## 2011-01-07 MED ORDER — PREDNISONE 10 MG PO TABS: 10.0000 mg | ORAL_TABLET | Freq: Every day | ORAL | Status: AC

## 2011-01-07 MED ORDER — AZITHROMYCIN 250 MG PO TABS: ORAL_TABLET | ORAL | Status: AC

## 2011-01-07 MED ORDER — HYDROCODONE-HOMATROPINE 5-1.5 MG/5ML PO SYRP: 5.0000 mL | ORAL_SOLUTION | Freq: Four times a day (QID) | ORAL | Status: AC | PRN

## 2011-01-07 NOTE — Patient Instructions (Addendum)
Take all new medications as prescribed Continue all other medications as before Please be aware that the prednisone will help the breathing, but can make the blood sugars increased mildy (but sugars go back to normal after the treatment)

## 2011-01-10 ENCOUNTER — Encounter: Payer: Self-pay | Admitting: Internal Medicine

## 2011-01-10 NOTE — Assessment & Plan Note (Signed)
Mild, for predpack asd,  to f/u any worsening symptoms or concerns  

## 2011-01-10 NOTE — Progress Notes (Signed)
  Subjective:    Patient ID: Nicolas Cantu, male    DOB: [birth year], 45 y.o.   MRN: [id number]  HPI Here with acute onset mild to mod 6-7 ST, HA, general weakness and malaise, with prod cough greenish sputum, but Pt denies chest pain, increased sob or doe,  orthopnea, PND, increased LE swelling, palpitations, dizziness or syncope, but has mild wheezing onset today.  Pt denies polydipsia, polyuria, or low sugar symptoms such as weakness or confusion improved with po intake.  Pt states overall good compliance with meds, trying to follow lower cholesterol, diabetic diet, wt overall stable but little exercise however.    Past Medical History  Diagnosis Date  . DIABETES MELLITUS, UNCONTROLLED 02/17/2009  . Diarrhea 02/17/2009  . HEMATOCHEZIA 02/17/2009  . HYPERLIPIDEMIA 02/17/2009  . LEG PAIN, BILATERAL 02/17/2009  . OSTEOARTHRITIS, KNEES, BILATERAL 05/14/2010  . Polyuria 03/24/2010  . Preventative health care 11/15/2010   No past surgical history on file.  reports that he has never smoked. He does not have any smokeless tobacco history on file. He reports that he drinks alcohol. He reports that he does not use illicit drugs. family history is not on file. No Known Allergies Current Outpatient Prescriptions on File Prior to Visit  Medication Sig Dispense Refill  . aspirin 81 MG EC tablet Take 81 mg by mouth daily.        Marland Kitchen glucose blood (ONE TOUCH TEST STRIPS) test strip Use as directed two times a day  100 each  3  . Lancets MISC Test as directed once per day  100 each  3  . simvastatin (ZOCOR) 20 MG tablet Take 1 tablet (20 mg total) by mouth every evening.  90 tablet  3   Review of Systems Review of Systems  Constitutional: Negative for diaphoresis and unexpected weight change.  HENT: Negative for drooling and tinnitus.   Eyes: Negative for photophobia and visual disturbance.  Respiratory: Negative for choking and stridor.   Gastrointestinal: Negative for vomiting and blood in stool.    Genitourinary: Negative for hematuria and decreased urine volume.        Objective:   Physical Exam BP 110/72  Pulse 77  Temp(Src) 98.6 F (37 C) (Oral)  Ht 5\' 3"  (1.6 m)  Wt 151 lb 8 oz (68.72 kg)  BMI 26.84 kg/m2  SpO2 94% Physical Exam  VS noted, mild ill Constitutional: Pt appears well-developed and well-nourished.  HENT: Head: Normocephalic.  Right Ear: External ear normal.  Left Ear: External ear normal.  Bilat tm's mild erythema.  Sinus nontender.  Pharynx mild erythema Eyes: Conjunctivae and EOM are normal. Pupils are equal, round, and reactive to light.  Neck: Normal range of motion. Neck supple.  Cardiovascular: Normal rate and regular rhythm.   Pulmonary/Chest: Effort normal and breath sounds bilat decreased, midl wheezes.  Abd:  Soft, NT, non-distended, + BS Neurological: Pt is alert. No cranial nerve deficit.  Skin: Skin is warm. No erythema.  Psychiatric: Pt behavior is normal. Thought content normal.         Assessment & Plan:

## 2011-01-10 NOTE — Assessment & Plan Note (Addendum)
stable overall by hx and exam, most recent data reviewed with pt, and pt to continue medical treatment as before;  To call with onset polys or cbg > 200 on steroid tx  Lab Results  Component Value Date   HGBA1C 6.3 11/17/2010

## 2011-01-10 NOTE — Assessment & Plan Note (Signed)
Mild to mod, for antibx course,  to f/u any worsening symptoms or concerns 

## 2011-02-02 ENCOUNTER — Other Ambulatory Visit: Payer: Self-pay | Admitting: Internal Medicine

## 2011-02-26 ENCOUNTER — Encounter: Payer: Self-pay | Admitting: Internal Medicine

## 2011-02-26 ENCOUNTER — Ambulatory Visit (INDEPENDENT_AMBULATORY_CARE_PROVIDER_SITE_OTHER): Payer: PRIVATE HEALTH INSURANCE | Admitting: Internal Medicine

## 2011-02-26 ENCOUNTER — Ambulatory Visit (INDEPENDENT_AMBULATORY_CARE_PROVIDER_SITE_OTHER)
Admission: RE | Admit: 2011-02-26 | Discharge: 2011-02-26 | Disposition: A | Discharge status: Home / Self Care | Payer: PRIVATE HEALTH INSURANCE | Source: Ambulatory Visit | Attending: Internal Medicine | Admitting: Internal Medicine

## 2011-02-26 DIAGNOSIS — J984 Other disorders of lung: Secondary | ICD-10-CM

## 2011-02-26 DIAGNOSIS — J45909 Unspecified asthma, uncomplicated: Secondary | ICD-10-CM | POA: Insufficient documentation

## 2011-02-26 DIAGNOSIS — R062 Wheezing: Secondary | ICD-10-CM

## 2011-02-26 DIAGNOSIS — R05 Cough: Secondary | ICD-10-CM

## 2011-02-26 DIAGNOSIS — IMO0001 Reserved for inherently not codable concepts without codable children: Secondary | ICD-10-CM

## 2011-02-26 DIAGNOSIS — J309 Allergic rhinitis, unspecified: Secondary | ICD-10-CM

## 2011-02-26 DIAGNOSIS — R911 Solitary pulmonary nodule: Secondary | ICD-10-CM | POA: Insufficient documentation

## 2011-02-26 DIAGNOSIS — R059 Cough, unspecified: Secondary | ICD-10-CM

## 2011-02-26 HISTORY — DX: Allergic rhinitis, unspecified: J30.9

## 2011-02-26 MED ORDER — MONTELUKAST SODIUM 10 MG PO TABS: 10.0000 mg | ORAL_TABLET | Freq: Every day | ORAL | Status: DC

## 2011-02-26 MED ORDER — FLUTICASONE-SALMETEROL 250-50 MCG/DOSE IN AEPB: 1.0000 | INHALATION_SPRAY | Freq: Two times a day (BID) | RESPIRATORY_TRACT | Status: DC

## 2011-02-26 MED ORDER — METHYLPREDNISOLONE ACETATE 80 MG/ML IJ SUSP
120.0000 mg | Freq: Once | INTRAMUSCULAR | Status: AC
  Administered 2011-02-26: 120 mg via INTRAMUSCULAR

## 2011-02-26 MED ORDER — FLUTICASONE PROPIONATE 50 MCG/ACT NA SUSP: 2.0000 | Freq: Every day | NASAL | Status: DC

## 2011-02-26 NOTE — Assessment & Plan Note (Signed)
stable overall by hx and exam, most recent data reviewed with pt, and pt to continue medical treatment as before  Lab Results  Component Value Date   HGBA1C 6.3 11/17/2010

## 2011-02-26 NOTE — Patient Instructions (Addendum)
You had the steroid shot today for allergies and asthma symptoms Take all new medications as prescribed - the generic flonase for nasal allergy treatment, advair 1 puff twice per day for wheezing/shortness of breath, and generic singulair 10 mg per day (for both)  - ALL sent to the pharmacy (and advair given as sample) Continue all other medications as before Please go to XRAY in the Basement for the x-ray test Please call the phone number [phone] (the PhoneTree System) for results of testing in 2-3 days;  When calling, simply dial the number, and when prompted enter the MRN number above (the Medical Record Number) and the # key, then the message should start.

## 2011-02-26 NOTE — Progress Notes (Signed)
  Subjective:    Patient ID: Nicolas Cantu, male    DOB: 1965/11/07, 45 y.o.   MRN: [id number]  HPI  Here to f/u after initialy improving with tx last visit for acute bronchitis/wheezing;  Unfortunately since about a wk after last visit he has had primarily persistent cough, with some thigh sputum in the AM only, which has only seemed to become wore in the past few wks, now some worse at night at well keeping him up and disrupting sleep;  Pt denies chest pain, orthopnea, PND, increased LE swelling, palpitations, dizziness or syncope but has some ? Wheezing, mid chest fullness and mild sob.  Pt denies new neurological symptoms such as new headache, or facial or extremity weakness or numbness  . Pt denies polydipsia, polyuria.  Has had some mild nasal allergy type symtpoms as well with ? Post nasal gtt, tickling back of throat.  Pt denies fever, wt loss, night sweats, loss of appetite, or other constitutional symptoms Past Medical History  Diagnosis Date  . DIABETES MELLITUS, UNCONTROLLED 02/17/2009  . Diarrhea 02/17/2009  . HEMATOCHEZIA 02/17/2009  . HYPERLIPIDEMIA 02/17/2009  . LEG PAIN, BILATERAL 02/17/2009  . OSTEOARTHRITIS, KNEES, BILATERAL 05/14/2010  . Polyuria 03/24/2010  . Preventative health care 11/15/2010  . Allergic rhinitis, cause unspecified 02/26/2011  . Asthma 02/26/2011   No past surgical history on file.  reports that he has never smoked. He does not have any smokeless tobacco history on file. He reports that he drinks alcohol. He reports that he does not use illicit drugs. family history is not on file. No Known Allergies Current Outpatient Prescriptions on File Prior to Visit  Medication Sig Dispense Refill  . aspirin 81 MG EC tablet Take 81 mg by mouth daily.        Marland Kitchen glucose blood (ONE TOUCH TEST STRIPS) test strip Use as directed two times a day  100 each  3  . Lancets MISC Test as directed once per day  100 each  3  . simvastatin (ZOCOR) 20 MG tablet Take 1 tablet (20 mg total)  by mouth every evening.  90 tablet  3   No current facility-administered medications on file prior to visit.   Review of Systems Review of Systems  Constitutional: Negative for diaphoresis and unexpected weight change.  HENT: Negative for drooling and tinnitus.   Eyes: Negative for photophobia and visual disturbance.  Respiratory: Negative for choking and stridor.   Gastrointestinal: Negative for vomiting and blood in stool.  Genitourinary: Negative for hematuria and decreased urine volume.       Objective:   Physical Exam BP 100/62  Pulse 69  Temp(Src) 98.1 F (36.7 C) (Oral)  Ht 5\' 3"  (1.6 m)  Wt 150 lb (68.04 kg)  BMI 26.57 kg/m2  SpO2 95% Physical Exam  VS noted, not ill appearing Constitutional: Pt appears well-developed and well-nourished.  HENT: Head: Normocephalic.  Right Ear: External ear normal.  Left Ear: External ear normal.  Left tm's mild erythema.  Sinus nontender.  Pharynx mild erythema Eyes: Conjunctivae and EOM are normal. Pupils are equal, round, and reactive to light.  Neck: Normal range of motion. Neck supple.  Cardiovascular: Normal rate and regular rhythm.   Pulmonary/Chest: Effort normal and breath sounds normal.  Neurological: Pt is alert. No cranial nerve deficit.  Skin: Skin is warm. No erythema.  Psychiatric: Pt behavior is normal. Thought content normal. not nervous appearing        Assessment & Plan:

## 2011-02-26 NOTE — Assessment & Plan Note (Signed)
Most likely related to asthma and post nasal gtt - for cxr to r/o other -

## 2011-02-26 NOTE — Assessment & Plan Note (Signed)
Mild to mod, likely with post nasal gtt and some element of the cough - for singulair and flonase asd,  to f/u any worsening symptoms or concerns

## 2011-02-26 NOTE — Assessment & Plan Note (Signed)
Likely asthma exac, allergy related seasonal I suspect - for depomedrol IM, advair asd, and singular qd

## 2011-03-03 ENCOUNTER — Other Ambulatory Visit: Payer: Self-pay | Admitting: Internal Medicine

## 2011-03-03 ENCOUNTER — Ambulatory Visit (INDEPENDENT_AMBULATORY_CARE_PROVIDER_SITE_OTHER)
Admission: RE | Admit: 2011-03-03 | Discharge: 2011-03-03 | Disposition: A | Discharge status: Home / Self Care | Payer: PRIVATE HEALTH INSURANCE | Source: Ambulatory Visit | Attending: Internal Medicine | Admitting: Internal Medicine

## 2011-03-03 DIAGNOSIS — R911 Solitary pulmonary nodule: Secondary | ICD-10-CM

## 2011-03-03 DIAGNOSIS — J984 Other disorders of lung: Secondary | ICD-10-CM

## 2011-03-18 ENCOUNTER — Institutional Professional Consult (permissible substitution): Payer: PRIVATE HEALTH INSURANCE | Admitting: Pulmonary Disease

## 2011-03-24 ENCOUNTER — Encounter: Payer: Self-pay | Admitting: Pulmonary Disease

## 2011-03-24 ENCOUNTER — Ambulatory Visit (INDEPENDENT_AMBULATORY_CARE_PROVIDER_SITE_OTHER): Payer: PRIVATE HEALTH INSURANCE | Admitting: Pulmonary Disease

## 2011-03-24 VITALS — BP 102/64 | HR 91 | Temp 98.2°F | Ht 63.0 in | Wt 149.8 lb

## 2011-03-24 DIAGNOSIS — R911 Solitary pulmonary nodule: Secondary | ICD-10-CM

## 2011-03-24 DIAGNOSIS — J984 Other disorders of lung: Secondary | ICD-10-CM

## 2011-03-24 NOTE — Patient Instructions (Signed)
Will schedule for PET scan, to get closer look at your "spot".  Will call you when the results are available.

## 2011-03-24 NOTE — Progress Notes (Signed)
  Subjective:    Patient ID: Nicolas Cantu, male    DOB: Dec 30, 1965, 45 y.o.   MRN: [id number]  HPI The patient is a 45 year old male who I've been asked to see for a left upper lobe pulmonary nodule.  He recently had a chest x-ray which demonstrated an abnormality in the left upper lobe, and a CT chest and verified a 1.2 cm density in the left upper lobe.  There was no significant lymphadenopathy or other abnormality.  The patient states that he has never had a chest x-ray prior to this, and is originally from [place].  He is unsure if he ever received BCG, but he did take "medicine for tuberculosis" when he entered the [place].  The patient is unsure of his PPD status.  He has never lived in [place] or [address].  He is eating well, and not losing weight.  He has not had any hemoptysis.  Patient does work as a Chartered certified accountant, and works with [employer], community.  However, he denies any significant particulate exposure, and does not do any type of grinding.   Review of Systems  Constitutional: Negative for fever and unexpected weight change.  HENT: Negative for ear pain, nosebleeds, congestion, sore throat, rhinorrhea, sneezing, trouble swallowing, dental problem, postnasal drip and sinus pressure.   Eyes: Negative for redness and itching.  Respiratory: Positive for cough and shortness of breath. Negative for chest tightness and wheezing.   Cardiovascular: Negative for palpitations and leg swelling.  Gastrointestinal: Negative for nausea and vomiting.  Genitourinary: Negative for dysuria.  Musculoskeletal: Negative for joint swelling.  Skin: Negative for rash.  Neurological: Negative for headaches.  Hematological: Does not bruise/bleed easily.  Psychiatric/Behavioral: Negative for dysphoric mood. The patient is not nervous/anxious.        Objective:   Physical Exam Constitutional:  Well developed, no acute distress  HENT:  Nares patent without discharge  Oropharynx without exudate, palate and  uvula are normal  Eyes:  Perrla, eomi, no scleral icterus  Neck:  No JVD, no TMG  Cardiovascular:  Normal rate, regular rhythm, no rubs or gallops.  No murmurs        Intact distal pulses  Pulmonary :  Normal breath sounds, no stridor or respiratory distress   No rales, rhonchi, or wheezing  Abdominal:  Soft, nondistended, bowel sounds present.  No tenderness noted.   Musculoskeletal:  No lower extremity edema noted.  Lymph Nodes:  No cervical lymphadenopathy noted  Skin:  No cyanosis noted  Neurologic:  Alert, appropriate, moves all 4 extremities without obvious deficit.         Assessment & Plan:

## 2011-03-24 NOTE — Assessment & Plan Note (Signed)
The patient has a 1.2 cm pulmonary nodule in the left upper lobe of unknown etiology.  He is a never smoker, and has taken medication in the past for what sounds like tuberculosis.  It is unclear if this was prescribed for active TB, or possibly a PPD associated with BCG in the past.  This could represent a tuberculoma.  The patient is at low risk for bronchogenic cancer, but it still needs to be considered.  It may simply be some type of benign neoplasm.  At this point, he will need PET scan for further evaluation.

## 2011-04-02 ENCOUNTER — Encounter (HOSPITAL_COMMUNITY): Payer: Self-pay

## 2011-04-02 ENCOUNTER — Encounter (HOSPITAL_COMMUNITY)
Admission: RE | Admit: 2011-04-02 | Discharge: 2011-04-02 | Disposition: A | Discharge status: Home / Self Care | Payer: PRIVATE HEALTH INSURANCE | Source: Ambulatory Visit | Attending: Pulmonary Disease | Admitting: Pulmonary Disease

## 2011-04-02 DIAGNOSIS — R918 Other nonspecific abnormal finding of lung field: Secondary | ICD-10-CM | POA: Insufficient documentation

## 2011-04-02 DIAGNOSIS — R911 Solitary pulmonary nodule: Secondary | ICD-10-CM

## 2011-04-02 MED ORDER — FLUDEOXYGLUCOSE F - 18 (FDG) INJECTION
18.6000 | Freq: Once | INTRAVENOUS | Status: AC | PRN
  Administered 2011-04-02: 18.6 via INTRAVENOUS

## 2011-04-07 ENCOUNTER — Telehealth: Payer: Self-pay | Admitting: Pulmonary Disease

## 2011-04-07 NOTE — Telephone Encounter (Signed)
Called and spoke with pt.  Pt scheduled to see [facility] on Friday 04/09/11 at 4:30 pm

## 2011-04-09 ENCOUNTER — Encounter: Payer: Self-pay | Admitting: Pulmonary Disease

## 2011-04-09 ENCOUNTER — Ambulatory Visit (INDEPENDENT_AMBULATORY_CARE_PROVIDER_SITE_OTHER): Payer: PRIVATE HEALTH INSURANCE | Admitting: Pulmonary Disease

## 2011-04-09 VITALS — BP 134/80 | HR 105 | Temp 98.5°F | Ht 63.0 in | Wt 151.4 lb

## 2011-04-09 DIAGNOSIS — J984 Other disorders of lung: Secondary | ICD-10-CM

## 2011-04-09 DIAGNOSIS — R911 Solitary pulmonary nodule: Secondary | ICD-10-CM

## 2011-04-09 NOTE — Patient Instructions (Signed)
Will do a followup scan of chest in 3 mos.  Will call you with results. Please call us if questions, or if you wish to change approach to this issue before the next scan

## 2011-04-09 NOTE — Progress Notes (Signed)
  Subjective:    Patient ID: Nicolas Cantu, male    DOB: 12-Jun-1965, 45 y.o.   MRN: [id number]  HPI The pt comes in for discussion of his recent PET.  His LUL nodule has mild uptake, with no other findings noted. I have had a long discussion with the pt about his scans, as well as the various management options.    Review of Systems  Constitutional: Negative for fever and unexpected weight change.  HENT: Negative for ear pain, nosebleeds, congestion, sore throat, rhinorrhea, sneezing, trouble swallowing, dental problem, postnasal drip and sinus pressure.   Eyes: Negative for redness and itching.  Respiratory: Positive for cough. Negative for chest tightness, shortness of breath and wheezing.   Cardiovascular: Negative for palpitations and leg swelling.  Gastrointestinal: Negative for nausea and vomiting.  Genitourinary: Negative for dysuria.  Musculoskeletal: Negative for joint swelling.  Skin: Negative for rash.  Neurological: Negative for headaches.  Hematological: Does not bruise/bleed easily.  Psychiatric/Behavioral: Negative for dysphoric mood. The patient is not nervous/anxious.        Objective:   Physical Exam Well developed male in nad Nares without purulence or discharge LE without edema, no cyanosis Alert, oriented, moves all 4        Assessment & Plan:

## 2011-04-09 NOTE — Assessment & Plan Note (Signed)
The patient has a 1.2 cm nodule in the left upper lobe, with his PET showing only very mild uptake.  The patient is at very low risk for bronchogenic cancer, but this does not exclude the possibility.  I have had a long discussion with him about the various management options, including surgery, biopsy, and further surveillance.  I do not think surgery is the appropriate choice at this time, and the patient agrees.  I have discussed with him the various biopsy techniques, including transthoracic needle aspiration and also electromagnetic navigational bronchoscopy.  I have reviewed the pneumothorax risk with both techniques, and also the yield for diagnosis.  After discussing all of this, the patient and I agreed to do a followup scan in approximately 3 months.  We'll make further decisions regarding management at that time.

## 2011-05-21 ENCOUNTER — Other Ambulatory Visit (INDEPENDENT_AMBULATORY_CARE_PROVIDER_SITE_OTHER): Payer: PRIVATE HEALTH INSURANCE

## 2011-05-21 ENCOUNTER — Encounter: Payer: Self-pay | Admitting: Internal Medicine

## 2011-05-21 ENCOUNTER — Other Ambulatory Visit: Payer: Self-pay | Admitting: Internal Medicine

## 2011-05-21 ENCOUNTER — Ambulatory Visit (INDEPENDENT_AMBULATORY_CARE_PROVIDER_SITE_OTHER): Payer: PRIVATE HEALTH INSURANCE | Admitting: Internal Medicine

## 2011-05-21 VITALS — BP 110/72 | HR 76 | Temp 98.6°F | Ht 63.0 in | Wt 152.0 lb

## 2011-05-21 DIAGNOSIS — Z79899 Other long term (current) drug therapy: Secondary | ICD-10-CM

## 2011-05-21 DIAGNOSIS — Z0000 Encounter for general adult medical examination without abnormal findings: Secondary | ICD-10-CM

## 2011-05-21 DIAGNOSIS — E785 Hyperlipidemia, unspecified: Secondary | ICD-10-CM

## 2011-05-21 DIAGNOSIS — J029 Acute pharyngitis, unspecified: Secondary | ICD-10-CM

## 2011-05-21 DIAGNOSIS — IMO0001 Reserved for inherently not codable concepts without codable children: Secondary | ICD-10-CM

## 2011-05-21 DIAGNOSIS — J45909 Unspecified asthma, uncomplicated: Secondary | ICD-10-CM

## 2011-05-21 LAB — BASIC METABOLIC PANEL
BUN: 17 mg/dL (ref 6–23)
CO2: 27 mEq/L (ref 19–32)
Calcium: 8.9 mg/dL (ref 8.4–10.5)
Chloride: 104 mEq/L (ref 96–112)
Creatinine, Ser: 1 mg/dL (ref 0.4–1.5)

## 2011-05-21 LAB — HEPATIC FUNCTION PANEL
ALT: 21 U/L (ref 0–53)
Total Protein: 7.3 g/dL (ref 6.0–8.3)

## 2011-05-21 LAB — LIPID PANEL
Cholesterol: 280 mg/dL — ABNORMAL HIGH (ref 0–200)
Triglycerides: 327 mg/dL — ABNORMAL HIGH (ref 0.0–149.0)
VLDL: 65.4 mg/dL — ABNORMAL HIGH (ref 0.0–40.0)

## 2011-05-21 LAB — HEMOGLOBIN A1C: Hgb A1c MFr Bld: 6.1 % (ref 4.6–6.5)

## 2011-05-21 MED ORDER — AZITHROMYCIN 250 MG PO TABS: ORAL_TABLET | ORAL | Status: AC

## 2011-05-21 NOTE — Patient Instructions (Signed)
Take all new medications as prescribed Continue all other medications as before Please go to LAB in the Basement for the blood and/or urine tests to be done today Please call the phone number [phone] (the PhoneTree System) for results of testing in 2-3 days;  When calling, simply dial the number, and when prompted enter the MRN number above (the Medical Record Number) and the # key, then the message should start. Please return in 6 mo with Lab testing done 3-5 days before  

## 2011-05-22 ENCOUNTER — Encounter: Payer: Self-pay | Admitting: Internal Medicine

## 2011-05-22 NOTE — Assessment & Plan Note (Signed)
stable overall by hx and exam, most recent data reviewed with pt, and pt to continue medical treatment as before  For lipid check, ,may need statin for LDL goal < 70

## 2011-05-22 NOTE — Assessment & Plan Note (Signed)
stable overall by hx and exam, most recent data reviewed with pt, and pt to continue medical treatment as before  Lab Results  Component Value Date   HGBA1C 6.3 11/17/2010    

## 2011-05-22 NOTE — Assessment & Plan Note (Signed)
stable overall by hx and exam, most recent data reviewed with pt, and pt to continue medical treatment as before SpO2 Readings from Last 3 Encounters:  05/21/11 97%  04/09/11 96%  03/24/11 95%

## 2011-05-22 NOTE — Assessment & Plan Note (Signed)
Mild to mod, for antibx course,  to f/u any worsening symptoms or concerns 

## 2011-05-22 NOTE — Progress Notes (Signed)
Subjective:    Patient ID: Nicolas Cantu, male    DOB: 02-23-1966, 45 y.o.   MRN: [id number]  HPI   Here with 3 days acute onset fever, severe ST getting worse, withoutfacial pain, pressure, but with general weakness and malaise, and greenish d/c, but little to no cough and Pt denies chest pain, increased sob or doe, wheezing, orthopnea, PND, increased LE swelling, palpitations, dizziness or syncope. Pt denies new neurological symptoms such as new headache, or facial or extremity weakness or numbness   Pt denies polydipsia, polyuria, or low sugar symptoms such as weakness or confusion improved with po intake.  Pt states overall good compliance with meds, trying to follow lower cholesterol, diabetic diet, wt overall stable but little exercise however.  Pt denies other wt loss, night sweats, loss of appetite, or other constitutional symptoms Past Medical History  Diagnosis Date  . DIABETES MELLITUS, UNCONTROLLED 02/17/2009  . Diarrhea 02/17/2009  . HEMATOCHEZIA 02/17/2009  . HYPERLIPIDEMIA 02/17/2009  . LEG PAIN, BILATERAL 02/17/2009  . OSTEOARTHRITIS, KNEES, BILATERAL 05/14/2010  . Polyuria 03/24/2010  . Preventative health care 11/15/2010  . Allergic rhinitis, cause unspecified 02/26/2011  . Asthma 02/26/2011   No past surgical history on file.  reports that he has never smoked. He does not have any smokeless tobacco history on file. He reports that he drinks alcohol. He reports that he does not use illicit drugs. family history is not on file. No Known Allergies Current Outpatient Prescriptions on File Prior to Visit  Medication Sig Dispense Refill  . aspirin 81 MG EC tablet Take 81 mg by mouth daily.        . fluticasone (FLONASE) 50 MCG/ACT nasal spray Place 2 sprays into the nose daily.  16 g  2  . Fluticasone-Salmeterol (ADVAIR DISKUS) 250-50 MCG/DOSE AEPB Inhale 1 puff into the lungs 2 (two) times daily.  1 each  11  . glucose blood (ONE TOUCH TEST STRIPS) test strip Use as directed two times a  day  100 each  3  . Lancets MISC Test as directed once per day  100 each  3  . montelukast (SINGULAIR) 10 MG tablet Take 1 tablet (10 mg total) by mouth daily.  30 tablet  11  . simvastatin (ZOCOR) 20 MG tablet Take 1 tablet (20 mg total) by mouth every evening.  90 tablet  3   Review of Systems Review of Systems  Constitutional: Negative for diaphoresis and unexpected weight change.  HENT: Negative for drooling and tinnitus.   Eyes: Negative for photophobia and visual disturbance.  Respiratory: Negative for choking and stridor.   Gastrointestinal: Negative for vomiting and blood in stool.  Genitourinary: Negative for hematuria and decreased urine volume.        Objective:   Physical Exam BP 110/72  Pulse 76  Temp(Src) 98.6 F (37 C) (Oral)  Ht 5\' 3"  (1.6 m)  Wt 152 lb (68.947 kg)  BMI 26.93 kg/m2  SpO2 97% Physical Exam  VS noted, mild ill appearing Constitutional: Pt appears well-developed and well-nourished.  HENT: Head: Normocephalic.  Right Ear: External ear normal.  Left Ear: External ear normal.  Bilat tm's mild erythema.  Sinus nontender.  Pharynx severe erythema with exudate Eyes: Conjunctivae and EOM are normal. Pupils are equal, round, and reactive to light.  Neck: Normal range of motion. Neck supple.  Cardiovascular: Normal rate and regular rhythm.   Pulmonary/Chest: Effort normal and breath sounds normal.  Neurological: Pt is alert. No cranial nerve deficit.  Skin: Skin is warm. No erythema.  Psychiatric: Pt behavior is normal. Thought content normal.     Assessment & Plan:

## 2011-05-24 ENCOUNTER — Other Ambulatory Visit: Payer: Self-pay | Admitting: Internal Medicine

## 2011-05-24 DIAGNOSIS — E785 Hyperlipidemia, unspecified: Secondary | ICD-10-CM

## 2011-05-24 DIAGNOSIS — Z79899 Other long term (current) drug therapy: Secondary | ICD-10-CM

## 2011-05-24 LAB — LDL CHOLESTEROL, DIRECT: Direct LDL: 168.3 mg/dL

## 2011-05-24 MED ORDER — ATORVASTATIN CALCIUM 20 MG PO TABS: 20.0000 mg | ORAL_TABLET | Freq: Every day | ORAL | Status: DC

## 2011-06-08 DIAGNOSIS — C3490 Malignant neoplasm of unspecified part of unspecified bronchus or lung: Secondary | ICD-10-CM

## 2011-06-08 HISTORY — DX: Malignant neoplasm of unspecified part of unspecified bronchus or lung: C34.90

## 2011-07-05 ENCOUNTER — Other Ambulatory Visit (INDEPENDENT_AMBULATORY_CARE_PROVIDER_SITE_OTHER): Payer: PRIVATE HEALTH INSURANCE

## 2011-07-05 ENCOUNTER — Ambulatory Visit (INDEPENDENT_AMBULATORY_CARE_PROVIDER_SITE_OTHER)
Admission: RE | Admit: 2011-07-05 | Discharge: 2011-07-05 | Disposition: A | Discharge status: Home / Self Care | Payer: PRIVATE HEALTH INSURANCE | Source: Ambulatory Visit | Attending: Pulmonary Disease | Admitting: Pulmonary Disease

## 2011-07-05 DIAGNOSIS — E785 Hyperlipidemia, unspecified: Secondary | ICD-10-CM

## 2011-07-05 DIAGNOSIS — R911 Solitary pulmonary nodule: Secondary | ICD-10-CM

## 2011-07-05 DIAGNOSIS — J984 Other disorders of lung: Secondary | ICD-10-CM

## 2011-07-05 DIAGNOSIS — Z79899 Other long term (current) drug therapy: Secondary | ICD-10-CM

## 2011-07-05 LAB — HEPATIC FUNCTION PANEL
ALT: 32 U/L (ref 0–53)
AST: 26 U/L (ref 0–37)
Alkaline Phosphatase: 71 U/L (ref 39–117)
Bilirubin, Direct: 0.1 mg/dL (ref 0.0–0.3)
Total Protein: 7.8 g/dL (ref 6.0–8.3)

## 2011-07-05 LAB — LIPID PANEL
Cholesterol: 169 mg/dL (ref 0–200)
Triglycerides: 163 mg/dL — ABNORMAL HIGH (ref 0.0–149.0)

## 2011-07-09 ENCOUNTER — Encounter: Payer: Self-pay | Admitting: Pulmonary Disease

## 2011-07-09 ENCOUNTER — Telehealth: Payer: Self-pay

## 2011-07-09 ENCOUNTER — Ambulatory Visit: Payer: PRIVATE HEALTH INSURANCE | Admitting: Pulmonary Disease

## 2011-07-09 ENCOUNTER — Ambulatory Visit (INDEPENDENT_AMBULATORY_CARE_PROVIDER_SITE_OTHER): Payer: PRIVATE HEALTH INSURANCE | Admitting: Pulmonary Disease

## 2011-07-09 VITALS — BP 130/90 | HR 65 | Temp 98.3°F | Ht 63.0 in | Wt 155.8 lb

## 2011-07-09 DIAGNOSIS — R911 Solitary pulmonary nodule: Secondary | ICD-10-CM

## 2011-07-09 NOTE — Telephone Encounter (Signed)
Patient called requesting lab results from Jan. 28, 2013. Called left message to call back

## 2011-07-09 NOTE — Telephone Encounter (Signed)
Patient called back informed of results and gave the patient the phone tree information to listen to detailed message from [facility].

## 2011-07-09 NOTE — Progress Notes (Signed)
  Subjective:    Patient ID: Nicolas Cantu, male    DOB: [birth year], 46 y.o.   MRN: [id number]  HPI Patient comes in today for followup after his recent CT chest.  He has a left upper lobe nodule with very low PET uptake, but his recent followup CT has shown a significant increase in the size of the nodule over 4 months.  I have reviewed this CT chest with him, and answered all of his questions.   Review of Systems  Constitutional: Negative for fever and unexpected weight change.  HENT: Positive for sneezing. Negative for ear pain, nosebleeds, congestion, sore throat, rhinorrhea, trouble swallowing, dental problem, postnasal drip and sinus pressure.   Eyes: Negative for redness and itching.  Respiratory: Positive for cough, chest tightness and wheezing. Negative for shortness of breath.   Cardiovascular: Negative for palpitations and leg swelling.  Gastrointestinal: Negative for nausea and vomiting.  Genitourinary: Negative for dysuria.  Musculoskeletal: Negative for joint swelling.  Skin: Negative for rash.  Neurological: Negative for headaches.  Hematological: Does not bruise/bleed easily.  Psychiatric/Behavioral: Negative for dysphoric mood. The patient is not nervous/anxious.        Objective:   Physical Exam Well-developed male in no acute distress Nose without purulence or discharge noted Lower extremities without edema, no cyanosis Alert and oriented, moves all 4 extremities.       Assessment & Plan:

## 2011-07-09 NOTE — Patient Instructions (Signed)
Your spot in the lung has grown over the last 4 mos, and I think you should consider having it removed.  The other option is for [place] to try and biopsy first, but it is deep in the lung, and biopsy can sometimes be misleading. Would like to send you to a chest surgeon for their opinion, and then you can make up your mind.

## 2011-07-09 NOTE — Assessment & Plan Note (Signed)
The patient's recent followup CT chest shows that his left upper lobe nodule has grown a fair amount in just for a short months.  Despite the fact that he is a never smoker, and that his PET scan in 2012 was not overly impressive, I am very concerned about the change in size from the last CT.  I have told the patient it is unclear whether this is a benign or malignant process, but I would recommend surgical resection over biopsy.  The lesion is deep in the lung, and therefore transthoracic needle aspiration is not the best choice.  We could do ENB, but there is a false negative risk, and therefore I would consider resection even with a negative ENB biopsy.  I have outlined all of this for the patient, and he is really unsure how to proceed.  There may be somewhat of a language barrier here.  I would like to refer him to thoracic surgery for their opinion.

## 2011-07-12 ENCOUNTER — Telehealth: Payer: Self-pay

## 2011-07-12 NOTE — Telephone Encounter (Signed)
Called left message to call back 

## 2011-07-12 NOTE — Telephone Encounter (Signed)
If no fever, wheezing or sob, can try delsym oTC and/or mucines to help with cough and congestion

## 2011-07-12 NOTE — Telephone Encounter (Signed)
Patient has had congestion, cough and chest tightness for 1 week. Patient would like advisement from MD, medication or OV call back number [phone]

## 2011-07-12 NOTE — Telephone Encounter (Signed)
Patient informed. 

## 2011-07-13 ENCOUNTER — Other Ambulatory Visit: Payer: Self-pay | Admitting: Internal Medicine

## 2011-07-21 ENCOUNTER — Encounter: Payer: Self-pay | Admitting: Cardiothoracic Surgery

## 2011-07-21 ENCOUNTER — Institutional Professional Consult (permissible substitution) (INDEPENDENT_AMBULATORY_CARE_PROVIDER_SITE_OTHER): Payer: PRIVATE HEALTH INSURANCE | Admitting: Cardiothoracic Surgery

## 2011-07-21 VITALS — BP 125/83 | HR 72 | Temp 97.4°F | Resp 18 | Ht 63.0 in | Wt 152.0 lb

## 2011-07-21 DIAGNOSIS — R911 Solitary pulmonary nodule: Secondary | ICD-10-CM

## 2011-07-21 DIAGNOSIS — Z8611 Personal history of tuberculosis: Secondary | ICD-10-CM

## 2011-07-21 NOTE — Progress Notes (Signed)
PCP is Oliver Barre, MD, MD Referring Provider is Clance, Maree Krabbe, MD                    [address] [place]            [place] [address]          [phone]      Chief Complaint  Patient presents with  . Lung Lesion    Referral from Dr Shelle Iron for surgical eval on Lung Nodule, Chest CT 07/05/2011    HPI: I was asked to evaluate this 46 year old [place] male nonsmoker for an enlarging left upper lobe nodule. In late 2012 this was picked up on a routine chest x-ray and a followup CT scan measured this at 1.2 cm. Dr. Shelle Iron evaluated the patient and performed a PET scan which showed SUV maximum activity of 2.9. There is no mediastinal adenopathy with activity. A followup CT scan in 3 months later showed the mass to increase in size 1.7 cm. Surgical resection was recommended by Dr. Shelle Iron.  The patient has history of tuberculosis in that he was screened in 1982 at a [place] apparently after a skin test and was treated for one year with oral meds. He states he has not had any chest x-rays or pulmonary symptoms since that time. He denies any history of thoracic trauma. Denies weight loss, night sweats, fever. He works full time as a Chartered certified accountant.     Past Medical History  Diagnosis Date  . DIABETES MELLITUS, UNCONTROLLED 02/17/2009  . Diarrhea 02/17/2009  . HEMATOCHEZIA 02/17/2009  . HYPERLIPIDEMIA 02/17/2009  . LEG PAIN, BILATERAL 02/17/2009  . OSTEOARTHRITIS, KNEES, BILATERAL 05/14/2010  . Polyuria 03/24/2010  . Preventative health care 11/15/2010  . Allergic rhinitis, cause unspecified 02/26/2011  . Asthma 02/26/2011    No past surgical history on file.  No family history on file.  Social History History  Substance Use Topics  . Smoking status: Never Smoker   . Smokeless tobacco: Not on file  . Alcohol Use: Yes     rare    Current Outpatient Prescriptions  Medication Sig Dispense Refill  . aspirin 81 MG EC tablet Take 81 mg by mouth daily.        Marland Kitchen  atorvastatin (LIPITOR) 20 MG tablet Take 1 tablet (20 mg total) by mouth daily.  90 tablet  3  . Lancets MISC Test as directed once per day  100 each  3  . Multiple Vitamin (MULTIVITAMIN) capsule Take 1 capsule by mouth daily.      . ONE TOUCH ULTRA TEST test strip USE AS DIRECTED TWICE A DAY  100 each  3    No Known Allergies  Review of Systems Constitutional review negative for fever weight loss ENT review negative for dental problems or change in vision Thoracic review positive for left upper lobe growing nodular density Cardiac review negative for murmur arrhythmia angina GI review negative for hepatitis jaundice Endocrine review positive or diabetes -Review negative for DVT TIA Neurologic review negative for seizure  BP 125/83  Pulse 72  Temp(Src) 97.4 F (36.3 C) (Oral)  Resp 18  Ht 5\' 3"  (1.6 m)  Wt 152 lb (68.947 kg)  BMI 26.93 kg/m2  SpO2 99% Physical Exam General appearance alert and oriented comfortable HEENT normocephalic pupils equal dentition good  Neck without JVD mass or bruit Lymphatics no palpable adenopathy in the neck or supralevator fossa Thorax no deformity tenderness clear breath sounds  cardiac regular rhythm without murmur gallop Abdomen soft nontender Extremities no edema clubbing cyanosis Neurologic intact    Diagnostic Tests:CT scans of the chest from late 2012 and recently R. reviewed and compared and discuss with patient. PET scan shows low-level activity 2.9 SUV of the mass. The patient has not had a brain MRI.   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 Surgical resection is recommended. The patient is hesitant to proceed with surgery which is discussed the situation with his family.   PlanReturn in 2 weeks for to further discuss left Vats and resection of the nodule. The patient will need a brain MRI prior to any surgery to complete clinical staging.:

## 2011-07-26 ENCOUNTER — Telehealth: Payer: Self-pay | Admitting: Internal Medicine

## 2011-07-26 DIAGNOSIS — R911 Solitary pulmonary nodule: Secondary | ICD-10-CM

## 2011-07-26 NOTE — Telephone Encounter (Signed)
The pt called and is hoping to get a referral to a different pulmonary dr.  He wants a second opinion in regards to what the [facility] pulmonary dr told him.  Thanks!   [phone]

## 2011-07-27 NOTE — Telephone Encounter (Signed)
Done per emr 

## 2011-07-29 ENCOUNTER — Ambulatory Visit (INDEPENDENT_AMBULATORY_CARE_PROVIDER_SITE_OTHER): Payer: PRIVATE HEALTH INSURANCE | Admitting: Internal Medicine

## 2011-07-29 ENCOUNTER — Encounter: Payer: Self-pay | Admitting: Internal Medicine

## 2011-07-29 VITALS — BP 102/70 | HR 101 | Temp 98.4°F | Ht 63.0 in | Wt 151.1 lb

## 2011-07-29 DIAGNOSIS — R911 Solitary pulmonary nodule: Secondary | ICD-10-CM

## 2011-07-29 DIAGNOSIS — J209 Acute bronchitis, unspecified: Secondary | ICD-10-CM | POA: Insufficient documentation

## 2011-07-29 DIAGNOSIS — R062 Wheezing: Secondary | ICD-10-CM | POA: Insufficient documentation

## 2011-07-29 MED ORDER — PREDNISONE 10 MG PO TABS: 10.0000 mg | ORAL_TABLET | Freq: Every day | ORAL | Status: DC

## 2011-07-29 MED ORDER — HYDROCODONE-HOMATROPINE 5-1.5 MG/5ML PO SYRP: 5.0000 mL | ORAL_SOLUTION | Freq: Four times a day (QID) | ORAL | Status: AC | PRN

## 2011-07-29 MED ORDER — AZITHROMYCIN 250 MG PO TABS: ORAL_TABLET | ORAL | Status: AC

## 2011-07-29 NOTE — Patient Instructions (Signed)
Take all new medications as prescribed Continue all other medications as before You will be contacted regarding the referral for: pulmonary - see the PCC's before leaving today

## 2011-07-31 ENCOUNTER — Encounter: Payer: Self-pay | Admitting: Internal Medicine

## 2011-07-31 NOTE — Assessment & Plan Note (Signed)
Mild to mod, for antibx course,  to f/u any worsening symptoms or concerns 

## 2011-07-31 NOTE — Assessment & Plan Note (Signed)
Poss lung ca, rec'd for surgury, pt has declines and asks for pulm second opinion, will defer to pt

## 2011-07-31 NOTE — Progress Notes (Signed)
  Subjective:    Patient ID: Nicolas Cantu, male    DOB: 21-May-1966, 46 y.o.   MRN: [id number]  HPI  Here with acute onset mild to mod 2-3 days ST, HA, general weakness and malaise, with prod cough greenish sputum, but Pt denies chest pain, increased sob or doe, wheezing, orthopnea, PND, increased LE swelling, palpitations, dizziness or syncope, except for mild anterior chset discomfort pleuritic. Pt denies new neurological symptoms such as new headache, or facial or extremity weakness or numbness   Pt denies polydipsia, polyuria, or low sugar symptoms such as weakness or confusion improved with po intake.  Pt states overall good compliance with meds, trying to follow lower cholesterol, diabetic diet, wt overall stable but little exercise however.  Pt with recent eval per pulm and chest surgeon - possible lung Ca, rec'd for surgury, but he has deferred, requests second pulm opinion, but has not heard from the office to date Past Medical History  Diagnosis Date  . DIABETES MELLITUS, UNCONTROLLED 02/17/2009  . Diarrhea 02/17/2009  . HEMATOCHEZIA 02/17/2009  . HYPERLIPIDEMIA 02/17/2009  . LEG PAIN, BILATERAL 02/17/2009  . OSTEOARTHRITIS, KNEES, BILATERAL 05/14/2010  . Polyuria 03/24/2010  . Preventative health care 11/15/2010  . Allergic rhinitis, cause unspecified 02/26/2011  . Asthma 02/26/2011   No past surgical history on file.  reports that he has never smoked. He does not have any smokeless tobacco history on file. He reports that he drinks alcohol. He reports that he does not use illicit drugs. family history is not on file. No Known Allergies Current Outpatient Prescriptions on File Prior to Visit  Medication Sig Dispense Refill  . aspirin 81 MG EC tablet Take 81 mg by mouth daily.        Marland Kitchen atorvastatin (LIPITOR) 20 MG tablet Take 1 tablet (20 mg total) by mouth daily.  90 tablet  3  . Lancets MISC Test as directed once per day  100 each  3  . Multiple Vitamin (MULTIVITAMIN) capsule Take 1 capsule  by mouth daily.      . ONE TOUCH ULTRA TEST test strip USE AS DIRECTED TWICE A DAY  100 each  3   Review of Systems Review of Systems  Constitutional: Negative for diaphoresis and unexpected weight change.  HENT: Negative for drooling and tinnitus.   Eyes: Negative for photophobia and visual disturbance.  Respiratory: Negative for choking and stridor.   Gastrointestinal: Negative for vomiting and blood in stool.  Genitourinary: Negative for hematuria and decreased urine volume.     Objective:   Physical Exam BP 102/70  Pulse 101  Temp(Src) 98.4 F (36.9 C) (Oral)  Ht 5\' 3"  (1.6 m)  Wt 151 lb 2 oz (68.55 kg)  BMI 26.77 kg/m2  SpO2 97% Physical Exam  VS noted, mild ill Constitutional: Pt appears well-developed and well-nourished.  HENT: Head: Normocephalic.  Right Ear: External ear normal.  Left Ear: External ear normal.  Bilat tm's mild erythema.  Sinus nontender.  Pharynx mild erythema Eyes: Conjunctivae and EOM are normal. Pupils are equal, round, and reactive to light.  Neck: Normal range of motion. Neck supple.  Cardiovascular: Normal rate and regular rhythm.   Pulmonary/Chest: Effort normal and breath sounds mild decr with bilat midl wheezes.  Neurological: Pt is alert. No cranial nerve deficit.  Skin: Skin is warm. No erythema.  Psychiatric: Pt behavior is normal. Thought content normal.     Assessment & Plan:

## 2011-07-31 NOTE — Assessment & Plan Note (Signed)
stable overall by hx and exam, most recent data reviewed with pt, and pt to continue medical treatment as before  Lab Results  Component Value Date   HGBA1C 6.1 05/21/2011   Pt to call for onset polys or cbg > 200 on steroid tx

## 2011-07-31 NOTE — Assessment & Plan Note (Signed)
Mild to mod, for predpack  course,  to f/u any worsening symptoms or concerns 

## 2011-08-04 ENCOUNTER — Encounter: Payer: PRIVATE HEALTH INSURANCE | Admitting: Cardiothoracic Surgery

## 2011-08-19 ENCOUNTER — Other Ambulatory Visit: Payer: Self-pay | Admitting: Internal Medicine

## 2011-09-22 ENCOUNTER — Telehealth: Payer: Self-pay

## 2011-09-22 DIAGNOSIS — R918 Other nonspecific abnormal finding of lung field: Secondary | ICD-10-CM

## 2011-09-22 NOTE — Telephone Encounter (Signed)
The patient would like a referral for second opinion to [facility] [address] [place] Please to contact patient once completed at [phone]

## 2011-09-22 NOTE — Telephone Encounter (Signed)
Called the patient left message that referral has been completed as requested.

## 2011-09-22 NOTE — Telephone Encounter (Signed)
Done per emr 

## 2011-10-08 ENCOUNTER — Other Ambulatory Visit: Payer: Self-pay | Admitting: Cardiothoracic Surgery

## 2011-10-08 ENCOUNTER — Ambulatory Visit (INDEPENDENT_AMBULATORY_CARE_PROVIDER_SITE_OTHER): Payer: PRIVATE HEALTH INSURANCE | Admitting: Cardiothoracic Surgery

## 2011-10-08 ENCOUNTER — Encounter: Payer: Self-pay | Admitting: Cardiothoracic Surgery

## 2011-10-08 ENCOUNTER — Other Ambulatory Visit: Payer: Self-pay

## 2011-10-08 VITALS — BP 139/88 | HR 113 | Resp 16 | Ht 63.0 in | Wt 153.0 lb

## 2011-10-08 DIAGNOSIS — Z8611 Personal history of tuberculosis: Secondary | ICD-10-CM

## 2011-10-08 DIAGNOSIS — IMO0001 Reserved for inherently not codable concepts without codable children: Secondary | ICD-10-CM

## 2011-10-08 DIAGNOSIS — R202 Paresthesia of skin: Secondary | ICD-10-CM

## 2011-10-08 DIAGNOSIS — D381 Neoplasm of uncertain behavior of trachea, bronchus and lung: Secondary | ICD-10-CM

## 2011-10-08 NOTE — Progress Notes (Signed)
PCP is Oliver Barre, MD, MD Referring Provider is Corwin Levins, MD  Chief Complaint  Patient presents with  . Lung Lesion    to further discuss surgery                                   [address]            [place] [address]          [phone]       HPI: 2 cm left upper lobe nodule increasing in size from 1.2 cm 2012 with mild hypermetabolic activity and PET scan. The patient presents today to discuss surgical resection. He has no coronary symptoms. He is never been a smoker. 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 Approximately 20 years ago he had tuberculosis in [place] was treated with oral TB medications for one year with resolution. He is asymptomatic.  No recent symptoms of bronchitis, sinusitis, cough, fever, night sweats, weight loss, chest pain.   Past Medical History  Diagnosis Date  . DIABETES MELLITUS, UNCONTROLLED 02/17/2009  . Diarrhea 02/17/2009  . HEMATOCHEZIA 02/17/2009  . HYPERLIPIDEMIA 02/17/2009  . LEG PAIN, BILATERAL 02/17/2009  . OSTEOARTHRITIS, KNEES, BILATERAL 05/14/2010  . Polyuria 03/24/2010  . Preventative health care 11/15/2010  . Allergic rhinitis, cause unspecified 02/26/2011  . Asthma 02/26/2011    History reviewed. No pertinent past surgical history.  History reviewed. No pertinent family history.  Social History History  Substance Use Topics  . Smoking status: Never Smoker   . Smokeless tobacco: Not on file  . Alcohol Use: Yes     rare    Current Outpatient Prescriptions  Medication Sig Dispense Refill  . aspirin 81 MG EC tablet Take 81 mg by mouth daily.        Marland Kitchen atorvastatin (LIPITOR) 20 MG tablet Take 1 tablet (20 mg total) by mouth daily.  90 tablet  3  . Multiple Vitamin (MULTIVITAMIN) capsule Take 1 capsule by mouth daily.      . ONE TOUCH ULTRA TEST test strip USE AS DIRECTED TWICE A DAY  100 each  3  . ONETOUCH DELICA LANCETS MISC USE TO TEST BLOOD SUGAR TWICE A DAY  100 each   3  . predniSONE (DELTASONE) 10 MG tablet Take 1 tablet (10 mg total) by mouth daily. 3 tabs by mouth per day for 3 days, then 2 tabs per day for 3 days, then 1 tab per day for 3 days, then stop  18 tablet  0    No Known Allergies  Review of Systems Constitutional review negative for fever weight loss History mild diabetes diet controlled No dental problems with swallowing Noted a change in vision brain MRI pending  GI review negative for pain or change in bowel habits No history of bleeding problems or blood transfusion no major surgeries  BP 139/88  Pulse 113  Resp 16  Ht 5\' 3"  (1.6 m)  Wt 153 lb (69.4 kg)  BMI 27.10 kg/m2  SpO2 96% Physical Exam General appearance middle-aged Asian male no acute distress he by wife HEENT normocephalic pupils equal good dentition Neck without JVD mass or bruit, no palpable adenopathy in the neck Thorax nontender no deformities clear breath sounds Cardiac regular rhythm without murmur or gallop Abdomen soft nontender without mass Extremities without clubbing cyanosis or tenderness  Vascular palpable pulses in all extremities  neurologic intact normal gait   Diagnostic Tests:  Chest CT scan reviewed with patient and wife he has a lobular density measuring 2 cm left upper lobe slightly increased in size from last year Is probably represents a hamartoma carcinoid or  slow-growing malignancy.  Plan:Left VATS probable left upper lobectomy plan for May 14. Procedure indications benefits risks alternatives discussed with patient. Expect possible postop course discussed with patient and wife. He understands and agrees to proceed.

## 2011-10-08 NOTE — Patient Instructions (Signed)
U. will be scheduled for a brain MRI prior to left lung surgery.

## 2011-10-09 LAB — CREATININE, SERUM: Creat: 0.98 mg/dL (ref 0.50–1.35)

## 2011-10-15 ENCOUNTER — Ambulatory Visit
Admission: RE | Admit: 2011-10-15 | Discharge: 2011-10-15 | Disposition: A | Discharge status: Home / Self Care | Payer: PRIVATE HEALTH INSURANCE | Source: Ambulatory Visit | Attending: Cardiothoracic Surgery | Admitting: Cardiothoracic Surgery

## 2011-10-15 ENCOUNTER — Encounter (HOSPITAL_COMMUNITY)
Admission: RE | Admit: 2011-10-15 | Discharge: 2011-10-15 | Disposition: A | Discharge status: Home / Self Care | Payer: PRIVATE HEALTH INSURANCE | Source: Ambulatory Visit | Attending: Cardiothoracic Surgery | Admitting: Cardiothoracic Surgery

## 2011-10-15 ENCOUNTER — Encounter (HOSPITAL_COMMUNITY): Payer: Self-pay

## 2011-10-15 VITALS — BP 127/80 | HR 110 | Temp 97.1°F | Resp 18 | Ht 63.0 in | Wt 154.5 lb

## 2011-10-15 DIAGNOSIS — D381 Neoplasm of uncertain behavior of trachea, bronchus and lung: Secondary | ICD-10-CM

## 2011-10-15 DIAGNOSIS — R202 Paresthesia of skin: Secondary | ICD-10-CM

## 2011-10-15 HISTORY — DX: Other nonspecific abnormal finding of lung field: R91.8

## 2011-10-15 HISTORY — DX: Respiratory tuberculosis unspecified: A15.9

## 2011-10-15 HISTORY — DX: Inflammatory liver disease, unspecified: K75.9

## 2011-10-15 HISTORY — DX: Shortness of breath: R06.02

## 2011-10-15 LAB — BLOOD GAS, ARTERIAL
Acid-Base Excess: 1.5 mmol/L (ref 0.0–2.0)
Bicarbonate: 25.4 mEq/L — ABNORMAL HIGH (ref 20.0–24.0)
FIO2: 0.21 %
O2 Saturation: 96.5 %
Patient temperature: 98.6
TCO2: 26.6 mmol/L (ref 0–100)
pCO2 arterial: 39.3 mmHg (ref 35.0–45.0)
pH, Arterial: 7.426 (ref 7.350–7.450)
pO2, Arterial: 80.4 mmHg (ref 80.0–100.0)

## 2011-10-15 LAB — COMPREHENSIVE METABOLIC PANEL
ALT: 29 U/L (ref 0–53)
AST: 22 U/L (ref 0–37)
Albumin: 4 g/dL (ref 3.5–5.2)
Alkaline Phosphatase: 80 U/L (ref 39–117)
BUN: 16 mg/dL (ref 6–23)
CO2: 24 mEq/L (ref 19–32)
Calcium: 9.6 mg/dL (ref 8.4–10.5)
Chloride: 94 mEq/L — ABNORMAL LOW (ref 96–112)
Creatinine, Ser: 0.88 mg/dL (ref 0.50–1.35)
GFR calc Af Amer: >90 mL/min (ref >90)
GFR calc non Af Amer: >90 mL/min (ref >90)
Glucose, Bld: 272 mg/dL — ABNORMAL HIGH (ref 70–99)
Potassium: 3.4 mEq/L — ABNORMAL LOW (ref 3.5–5.1)
Sodium: 133 mEq/L — ABNORMAL LOW (ref 135–145)
Total Bilirubin: 0.6 mg/dL (ref 0.3–1.2)
Total Protein: 7.8 g/dL (ref 6.0–8.3)

## 2011-10-15 LAB — CBC
HCT: 42.3 % (ref 39.0–52.0)
Hemoglobin: 14.5 g/dL (ref 13.0–17.0)
MCH: 29.2 pg (ref 26.0–34.0)
MCHC: 34.3 g/dL (ref 30.0–36.0)
MCV: 85.1 fL (ref 78.0–100.0)
Platelets: 224 10*3/uL (ref 150–400)
RBC: 4.97 MIL/uL (ref 4.22–5.81)
RDW: 11.9 % (ref 11.5–15.5)
WBC: 7.2 10*3/uL (ref 4.0–10.5)

## 2011-10-15 LAB — URINALYSIS, ROUTINE W REFLEX MICROSCOPIC
Bilirubin Urine: NEGATIVE
Glucose, UA: >1000 mg/dL — AB
Hgb urine dipstick: NEGATIVE
Ketones, ur: NEGATIVE mg/dL
Leukocytes, UA: NEGATIVE
Nitrite: NEGATIVE
Protein, ur: NEGATIVE mg/dL
Specific Gravity, Urine: 1.025 (ref 1.005–1.030)
Urobilinogen, UA: 0.2 mg/dL (ref 0.0–1.0)
pH: 5.5 (ref 5.0–8.0)

## 2011-10-15 LAB — BILIRUBIN, DIRECT: Bilirubin, Direct: 0.1 mg/dL (ref 0.0–0.3)

## 2011-10-15 LAB — PROTIME-INR
INR: 0.93 (ref 0.00–1.49)
Prothrombin Time: 12.7 seconds (ref 11.6–15.2)

## 2011-10-15 LAB — SURGICAL PCR SCREEN: Staphylococcus aureus: NEGATIVE

## 2011-10-15 LAB — ABO/RH: ABO/RH(D): A POS

## 2011-10-15 LAB — APTT: aPTT: 30 seconds (ref 24–37)

## 2011-10-15 LAB — URINE MICROSCOPIC-ADD ON: Urine-Other: NONE SEEN

## 2011-10-15 MED ORDER — GADOBENATE DIMEGLUMINE 529 MG/ML IV SOLN
14.0000 mL | Freq: Once | INTRAVENOUS | Status: AC | PRN
  Administered 2011-10-15: 14 mL via INTRAVENOUS

## 2011-10-15 NOTE — Pre-Procedure Instructions (Addendum)
20 Nicolas Cantu  10/15/2011   Your procedure is scheduled on:  5.14.13  Report to [facility] at 530AM.  Call this number if you have problems the morning of surgery: [phone]   Remember:   Do not eat food:After Midnight.  May have clear liquids: up to 4 Hours before arrival. 130 am  Clear liquids include soda, tea, black coffee, apple or grape juice, broth.  Take these medicines the morning of surgery with A SIP OF WATER:none  STOP multi vit, aspirin as instructed per dr Zenaida Niece trigt   Do not wear jewelry,   Do not wear lotions, powders, or perfumes. You may wear deodorant.    Do not bring valuables to the hospital.  Contacts, dentures or bridgework may not be worn into surgery.  Leave suitcase in the car. After surgery it may be brought to your room.  For patients admitted to the hospital, checkout time is 11:00 AM the day of discharge.   Patients discharged the day of surgery will not be allowed to drive home.  Name and phone number of your driver: [phone]  Special Instructions: CHG Shower Use Special Wash: 1/2 bottle night before surgery and 1/2 bottle morning of surgery.   Please read over the following fact sheets that you were given: Pain Booklet, Coughing and Deep Breathing, MRSA Information and Surgical Site Infection Prevention, transfusion info

## 2011-10-18 HISTORY — PX: LUNG REMOVAL, PARTIAL: SHX233

## 2011-10-18 MED ORDER — DEXTROSE 5 % IV SOLN
1.5000 g | INTRAVENOUS | Status: AC
  Administered 2011-10-19: 1.5 g via INTRAVENOUS
  Filled 2011-10-18: qty 1.5

## 2011-10-19 ENCOUNTER — Encounter (HOSPITAL_COMMUNITY): Payer: Self-pay | Admitting: *Deleted

## 2011-10-19 ENCOUNTER — Inpatient Hospital Stay (HOSPITAL_COMMUNITY)
Admission: RE | Admit: 2011-10-19 | Discharge: 2011-10-23 | DRG: 165 | Disposition: A | Discharge status: Home / Self Care | Payer: PRIVATE HEALTH INSURANCE | Source: Ambulatory Visit | Attending: Cardiothoracic Surgery | Admitting: Cardiothoracic Surgery

## 2011-10-19 ENCOUNTER — Encounter (HOSPITAL_COMMUNITY): Payer: Self-pay | Admitting: Certified Registered"

## 2011-10-19 ENCOUNTER — Inpatient Hospital Stay (HOSPITAL_COMMUNITY): Payer: PRIVATE HEALTH INSURANCE

## 2011-10-19 ENCOUNTER — Encounter (HOSPITAL_COMMUNITY)
Admission: RE | Disposition: A | Discharge status: Home / Self Care | Payer: Self-pay | Source: Ambulatory Visit | Attending: Cardiothoracic Surgery

## 2011-10-19 ENCOUNTER — Ambulatory Visit (HOSPITAL_COMMUNITY): Payer: PRIVATE HEALTH INSURANCE | Admitting: Certified Registered"

## 2011-10-19 DIAGNOSIS — D381 Neoplasm of uncertain behavior of trachea, bronchus and lung: Secondary | ICD-10-CM

## 2011-10-19 DIAGNOSIS — C3410 Malignant neoplasm of upper lobe, unspecified bronchus or lung: Principal | ICD-10-CM | POA: Diagnosis present

## 2011-10-19 DIAGNOSIS — Z79899 Other long term (current) drug therapy: Secondary | ICD-10-CM

## 2011-10-19 DIAGNOSIS — E119 Type 2 diabetes mellitus without complications: Secondary | ICD-10-CM | POA: Diagnosis present

## 2011-10-19 DIAGNOSIS — M1710 Unilateral primary osteoarthritis, unspecified knee: Secondary | ICD-10-CM | POA: Diagnosis present

## 2011-10-19 DIAGNOSIS — J45909 Unspecified asthma, uncomplicated: Secondary | ICD-10-CM | POA: Diagnosis present

## 2011-10-19 DIAGNOSIS — E785 Hyperlipidemia, unspecified: Secondary | ICD-10-CM | POA: Diagnosis present

## 2011-10-19 LAB — BLOOD GAS, ARTERIAL
Acid-base deficit: 1.6 mmol/L (ref 0.0–2.0)
Bicarbonate: 24.3 mEq/L — ABNORMAL HIGH (ref 20.0–24.0)
Drawn by: 296031
O2 Content: 4 L/min
O2 Saturation: 92.1 %
Patient temperature: 97.2
TCO2: 25.9 mmol/L (ref 0–100)
pCO2 arterial: 51.6 mmHg — ABNORMAL HIGH (ref 35.0–45.0)
pH, Arterial: 7.289 — ABNORMAL LOW (ref 7.350–7.450)
pO2, Arterial: 76.7 mmHg — ABNORMAL LOW (ref 80.0–100.0)

## 2011-10-19 LAB — GLUCOSE, CAPILLARY
Glucose-Capillary: 103 mg/dL — ABNORMAL HIGH (ref 70–99)
Glucose-Capillary: 133 mg/dL — ABNORMAL HIGH (ref 70–99)
Glucose-Capillary: 153 mg/dL — ABNORMAL HIGH (ref 70–99)
Glucose-Capillary: 156 mg/dL — ABNORMAL HIGH (ref 70–99)
Glucose-Capillary: 122 mg/dL — ABNORMAL HIGH (ref 70–99)

## 2011-10-19 SURGERY — VIDEO ASSISTED THORACOSCOPY (VATS)/ LOBECTOMY
Anesthesia: General | Site: Chest | Laterality: Left | Wound class: Clean Contaminated

## 2011-10-19 MED ORDER — PHENYLEPHRINE HCL 10 MG/ML IJ SOLN
INTRAMUSCULAR | Status: DC | PRN
  Administered 2011-10-19 (×3): 80 ug via INTRAVENOUS
  Administered 2011-10-19 (×2): 40 ug via INTRAVENOUS
  Administered 2011-10-19: 80 ug via INTRAVENOUS

## 2011-10-19 MED ORDER — FENTANYL CITRATE 0.05 MG/ML IJ SOLN
INTRAMUSCULAR | Status: DC | PRN
  Administered 2011-10-19 (×3): 50 ug via INTRAVENOUS
  Administered 2011-10-19: 100 ug via INTRAVENOUS
  Administered 2011-10-19 (×3): 50 ug via INTRAVENOUS

## 2011-10-19 MED ORDER — POTASSIUM CHLORIDE 10 MEQ/50ML IV SOLN
10.0000 meq | Freq: Every day | INTRAVENOUS | Status: DC | PRN
  Filled 2011-10-19: qty 150
  Filled 2011-10-19: qty 50

## 2011-10-19 MED ORDER — PROPOFOL 10 MG/ML IV EMUL
INTRAVENOUS | Status: DC | PRN
  Administered 2011-10-19: 120 mg via INTRAVENOUS

## 2011-10-19 MED ORDER — BUPIVACAINE 0.5 % ON-Q PUMP SINGLE CATH 400 ML
400.0000 mL | INJECTION | Status: DC
  Filled 2011-10-19: qty 400

## 2011-10-19 MED ORDER — OXYCODONE HCL 5 MG PO TABS
5.0000 mg | ORAL_TABLET | ORAL | Status: AC | PRN
  Administered 2011-10-19 – 2011-10-20 (×3): 10 mg via ORAL
  Filled 2011-10-19 (×3): qty 2

## 2011-10-19 MED ORDER — MORPHINE SULFATE 4 MG/ML IJ SOLN: 0.0500 mg/kg | INTRAMUSCULAR | Status: DC | PRN

## 2011-10-19 MED ORDER — SODIUM CHLORIDE 0.9 % IJ SOLN: 9.0000 mL | INTRAMUSCULAR | Status: DC | PRN

## 2011-10-19 MED ORDER — LUNG SURGERY BOOK
Freq: Once | Status: AC
  Administered 2011-10-19: 21:00:00
  Filled 2011-10-19: qty 1

## 2011-10-19 MED ORDER — SODIUM CHLORIDE 0.9 % IV SOLN
INTRAVENOUS | Status: DC
  Administered 2011-10-19: 20:00:00 via INTRAVENOUS

## 2011-10-19 MED ORDER — VECURONIUM BROMIDE 10 MG IV SOLR
INTRAVENOUS | Status: DC | PRN
  Administered 2011-10-19: 1 mg via INTRAVENOUS
  Administered 2011-10-19: 2 mg via INTRAVENOUS
  Administered 2011-10-19: 4 mg via INTRAVENOUS

## 2011-10-19 MED ORDER — HYDROMORPHONE HCL PF 1 MG/ML IJ SOLN
INTRAMUSCULAR | Status: AC
  Administered 2011-10-19: 0.5 mg via INTRAVENOUS
  Filled 2011-10-19: qty 1

## 2011-10-19 MED ORDER — INSULIN ASPART 100 UNIT/ML ~~LOC~~ SOLN
0.0000 [IU] | Freq: Four times a day (QID) | SUBCUTANEOUS | Status: DC
  Administered 2011-10-19 – 2011-10-20 (×4): 2 [IU] via SUBCUTANEOUS

## 2011-10-19 MED ORDER — DIPHENHYDRAMINE HCL 50 MG/ML IJ SOLN
12.5000 mg | Freq: Four times a day (QID) | INTRAMUSCULAR | Status: DC | PRN
  Administered 2011-10-19 – 2011-10-20 (×2): 12.5 mg via INTRAVENOUS
  Filled 2011-10-19 (×2): qty 1

## 2011-10-19 MED ORDER — NALOXONE HCL 0.4 MG/ML IJ SOLN: 0.4000 mg | INTRAMUSCULAR | Status: DC | PRN

## 2011-10-19 MED ORDER — ONDANSETRON HCL 4 MG/2ML IJ SOLN: 4.0000 mg | Freq: Once | INTRAMUSCULAR | Status: DC | PRN

## 2011-10-19 MED ORDER — BUPIVACAINE ON-Q PAIN PUMP (FOR ORDER SET NO CHG): INJECTION | Status: DC

## 2011-10-19 MED ORDER — GLYCOPYRROLATE 0.2 MG/ML IJ SOLN
INTRAMUSCULAR | Status: DC | PRN
  Administered 2011-10-19: 0.4 mg via INTRAVENOUS

## 2011-10-19 MED ORDER — ONDANSETRON HCL 4 MG/2ML IJ SOLN: 4.0000 mg | Freq: Four times a day (QID) | INTRAMUSCULAR | Status: DC | PRN

## 2011-10-19 MED ORDER — DIPHENHYDRAMINE HCL 12.5 MG/5ML PO ELIX
12.5000 mg | ORAL_SOLUTION | Freq: Four times a day (QID) | ORAL | Status: DC | PRN
  Filled 2011-10-19: qty 5

## 2011-10-19 MED ORDER — HYDROMORPHONE HCL PF 1 MG/ML IJ SOLN
0.2500 mg | INTRAMUSCULAR | Status: DC | PRN
  Administered 2011-10-19 (×4): 0.5 mg via INTRAVENOUS

## 2011-10-19 MED ORDER — ASPIRIN EC 81 MG PO TBEC
81.0000 mg | DELAYED_RELEASE_TABLET | Freq: Every day | ORAL | Status: DC
Start: 2011-10-19 — End: 2011-10-23
  Administered 2011-10-19 – 2011-10-22 (×4): 81 mg via ORAL
  Filled 2011-10-19 (×5): qty 1

## 2011-10-19 MED ORDER — BUPIVACAINE HCL (PF) 0.5 % IJ SOLN
INTRAMUSCULAR | Status: DC | PRN
  Administered 2011-10-19: 30 mL

## 2011-10-19 MED ORDER — ASPIRIN 81 MG PO TBEC: 81.0000 mg | DELAYED_RELEASE_TABLET | Freq: Every day | ORAL | Status: DC

## 2011-10-19 MED ORDER — OXYCODONE-ACETAMINOPHEN 5-325 MG PO TABS
1.0000 | ORAL_TABLET | ORAL | Status: DC | PRN
  Filled 2011-10-19 (×5): qty 2

## 2011-10-19 MED ORDER — ACETAMINOPHEN 10 MG/ML IV SOLN
1000.0000 mg | Freq: Four times a day (QID) | INTRAVENOUS | Status: AC
  Administered 2011-10-19 – 2011-10-20 (×4): 1000 mg via INTRAVENOUS
  Filled 2011-10-19 (×3): qty 100

## 2011-10-19 MED ORDER — ACETAMINOPHEN 10 MG/ML IV SOLN
INTRAVENOUS | Status: AC
  Administered 2011-10-19: 1000 mg via INTRAVENOUS
  Filled 2011-10-19: qty 100

## 2011-10-19 MED ORDER — MIDAZOLAM HCL 5 MG/5ML IJ SOLN
INTRAMUSCULAR | Status: DC | PRN
  Administered 2011-10-19: 2 mg via INTRAVENOUS

## 2011-10-19 MED ORDER — BISACODYL 5 MG PO TBEC
10.0000 mg | DELAYED_RELEASE_TABLET | Freq: Every day | ORAL | Status: DC
  Administered 2011-10-19 – 2011-10-21 (×3): 10 mg via ORAL
  Filled 2011-10-19: qty 1
  Filled 2011-10-19 (×2): qty 2

## 2011-10-19 MED ORDER — OXYCODONE-ACETAMINOPHEN 5-325 MG PO TABS
1.0000 | ORAL_TABLET | ORAL | Status: DC | PRN
  Administered 2011-10-20: 2 via ORAL
  Administered 2011-10-20: 1 via ORAL
  Administered 2011-10-21: 2 via ORAL
  Administered 2011-10-21: 1 via ORAL
  Administered 2011-10-21 – 2011-10-23 (×9): 2 via ORAL
  Filled 2011-10-19 (×3): qty 2
  Filled 2011-10-19 (×2): qty 1
  Filled 2011-10-19 (×3): qty 2

## 2011-10-19 MED ORDER — FENTANYL 10 MCG/ML IV SOLN
INTRAVENOUS | Status: DC
  Administered 2011-10-19: 240 ug via INTRAVENOUS
  Administered 2011-10-19: 21:00:00 via INTRAVENOUS
  Administered 2011-10-19: 150 ug via INTRAVENOUS
  Administered 2011-10-20: 12:00:00 via INTRAVENOUS
  Administered 2011-10-20: 300 ug via INTRAVENOUS
  Administered 2011-10-20: 225 ug via INTRAVENOUS
  Administered 2011-10-20: 240 ug via INTRAVENOUS
  Administered 2011-10-20: 255 ug via INTRAVENOUS
  Administered 2011-10-20: 20:00:00 via INTRAVENOUS
  Administered 2011-10-20: 300 ug via INTRAVENOUS
  Administered 2011-10-20: 05:00:00 via INTRAVENOUS
  Administered 2011-10-20: 234 ug via INTRAVENOUS
  Administered 2011-10-21: 120 ug via INTRAVENOUS
  Administered 2011-10-21: 210 ug via INTRAVENOUS
  Administered 2011-10-21: 90 ug via INTRAVENOUS
  Administered 2011-10-21 (×2): 75 ug via INTRAVENOUS
  Administered 2011-10-21: 45 ug via INTRAVENOUS
  Administered 2011-10-22: 75 ug via INTRAVENOUS
  Administered 2011-10-22: 20 ug via INTRAVENOUS
  Filled 2011-10-19 (×5): qty 50

## 2011-10-19 MED ORDER — DEXTROSE 5 % IV SOLN
1.5000 g | Freq: Two times a day (BID) | INTRAVENOUS | Status: AC
  Administered 2011-10-19 – 2011-10-20 (×2): 1.5 g via INTRAVENOUS
  Filled 2011-10-19 (×2): qty 1.5

## 2011-10-19 MED ORDER — LACTATED RINGERS IV SOLN
INTRAVENOUS | Status: DC | PRN
  Administered 2011-10-19 (×2): via INTRAVENOUS

## 2011-10-19 MED ORDER — FENTANYL 10 MCG/ML IV SOLN
Freq: Once | INTRAVENOUS | Status: DC
  Administered 2011-10-19: 13:00:00 via INTRAVENOUS
  Administered 2011-10-19: 15 ug via INTRAVENOUS
  Filled 2011-10-19: qty 50

## 2011-10-19 MED ORDER — NEOSTIGMINE METHYLSULFATE 1 MG/ML IJ SOLN
INTRAMUSCULAR | Status: DC | PRN
  Administered 2011-10-19: 3 mg via INTRAVENOUS

## 2011-10-19 MED ORDER — SENNOSIDES-DOCUSATE SODIUM 8.6-50 MG PO TABS
1.0000 | ORAL_TABLET | Freq: Every evening | ORAL | Status: DC | PRN
  Filled 2011-10-19: qty 1

## 2011-10-19 MED ORDER — MULTIVITAMINS PO CAPS: 1.0000 | ORAL_CAPSULE | Freq: Every day | ORAL | Status: DC

## 2011-10-19 MED ORDER — MORPHINE SULFATE 10 MG/ML IJ SOLN
INTRAMUSCULAR | Status: DC | PRN
  Administered 2011-10-19: 5 mg via INTRAVENOUS

## 2011-10-19 MED ORDER — DEXTROSE-NACL 5-0.9 % IV SOLN
INTRAVENOUS | Status: DC
  Administered 2011-10-19: 14:00:00 via INTRAVENOUS

## 2011-10-19 MED ORDER — SIMVASTATIN 40 MG PO TABS
40.0000 mg | ORAL_TABLET | Freq: Every day | ORAL | Status: DC
  Administered 2011-10-19 – 2011-10-22 (×4): 40 mg via ORAL
  Filled 2011-10-19 (×5): qty 1

## 2011-10-19 MED ORDER — ADULT MULTIVITAMIN W/MINERALS CH
1.0000 | ORAL_TABLET | Freq: Every day | ORAL | Status: DC
  Administered 2011-10-20 – 2011-10-22 (×3): 1 via ORAL
  Filled 2011-10-19 (×4): qty 1

## 2011-10-19 MED ORDER — ROCURONIUM BROMIDE 100 MG/10ML IV SOLN
INTRAVENOUS | Status: DC | PRN
  Administered 2011-10-19: 50 mg via INTRAVENOUS

## 2011-10-19 MED ORDER — TRAMADOL HCL 50 MG PO TABS
50.0000 mg | ORAL_TABLET | Freq: Four times a day (QID) | ORAL | Status: DC | PRN
  Administered 2011-10-19: 100 mg via ORAL
  Administered 2011-10-20 – 2011-10-21 (×3): 50 mg via ORAL
  Administered 2011-10-22 – 2011-10-23 (×2): 100 mg via ORAL
  Filled 2011-10-19: qty 1
  Filled 2011-10-19 (×2): qty 2
  Filled 2011-10-19: qty 1
  Filled 2011-10-19: qty 2
  Filled 2011-10-19: qty 1

## 2011-10-19 MED ORDER — INSULIN GLARGINE 100 UNIT/ML ~~LOC~~ SOLN: 12.0000 [IU] | Freq: Every day | SUBCUTANEOUS | Status: DC

## 2011-10-19 MED ORDER — ONDANSETRON HCL 4 MG/2ML IJ SOLN
INTRAMUSCULAR | Status: DC | PRN
  Administered 2011-10-19: 4 mg via INTRAVENOUS

## 2011-10-19 MED ORDER — 0.9 % SODIUM CHLORIDE (POUR BTL) OPTIME
TOPICAL | Status: DC | PRN
  Administered 2011-10-19: 2000 mL

## 2011-10-19 SURGICAL SUPPLY — 73 items
BAG DECANTER FOR FLEXI CONT (MISCELLANEOUS) IMPLANT
BLADE SURG 11 STRL SS (BLADE) ×6 IMPLANT
CANISTER SUCTION 2500CC (MISCELLANEOUS) ×3 IMPLANT
CATH HYDRAGLIDE XL THORACIC (CATHETERS) ×3 IMPLANT
CATH KIT ON Q 5IN SLV (PAIN MANAGEMENT) ×3 IMPLANT
CATH MALECOT BARD  28FR (CATHETERS) ×3 IMPLANT
CATH ROBINSON RED A/P 22FR (CATHETERS) IMPLANT
CATH THORACIC 28FR (CATHETERS) IMPLANT
CATH THORACIC 36FR (CATHETERS) IMPLANT
CATH THORACIC 36FR RT ANG (CATHETERS) IMPLANT
CLOTH BEACON ORANGE TIMEOUT ST (SAFETY) ×3 IMPLANT
CONN ST 1/4X3/8  BEN (MISCELLANEOUS) ×4
CONN ST 1/4X3/8 BEN (MISCELLANEOUS) ×2 IMPLANT
CONN Y 3/8X3/8X3/8  BEN (MISCELLANEOUS) ×2
CONN Y 3/8X3/8X3/8 BEN (MISCELLANEOUS) ×1 IMPLANT
CONT SPEC 4OZ CLIKSEAL STRL BL (MISCELLANEOUS) ×6 IMPLANT
COVER SURGICAL LIGHT HANDLE (MISCELLANEOUS) ×6 IMPLANT
DRAPE LAPAROSCOPIC ABDOMINAL (DRAPES) ×3 IMPLANT
DRAPE SLUSH MACHINE 52X66 (DRAPES) ×3 IMPLANT
ELECT REM PT RETURN 9FT ADLT (ELECTROSURGICAL) ×3
ELECTRODE REM PT RTRN 9FT ADLT (ELECTROSURGICAL) ×1 IMPLANT
GLOVE BIO SURGEON STRL SZ 6.5 (GLOVE) ×4 IMPLANT
GLOVE BIO SURGEON STRL SZ7 (GLOVE) ×3 IMPLANT
GLOVE BIO SURGEON STRL SZ7.5 (GLOVE) ×9 IMPLANT
GLOVE BIO SURGEONS STRL SZ 6.5 (GLOVE) ×2
GOWN STRL NON-REIN LRG LVL3 (GOWN DISPOSABLE) ×9 IMPLANT
HANDLE STAPLE ENDO GIA SHORT (STAPLE) ×2
KIT BASIN OR (CUSTOM PROCEDURE TRAY) ×3 IMPLANT
KIT ROOM TURNOVER OR (KITS) ×3 IMPLANT
KIT SUCTION CATH 14FR (SUCTIONS) ×3 IMPLANT
LOOP VESSEL MAXI BLUE (MISCELLANEOUS) ×3 IMPLANT
NS IRRIG 1000ML POUR BTL (IV SOLUTION) ×6 IMPLANT
PACK CHEST (CUSTOM PROCEDURE TRAY) ×3 IMPLANT
PAD ARMBOARD 7.5X6 YLW CONV (MISCELLANEOUS) ×6 IMPLANT
RELOAD EGIA 60 MED/THCK PURPLE (STAPLE) ×9 IMPLANT
RELOAD EGIA TRIS TAN 45 CVD (STAPLE) ×18 IMPLANT
RELOAD TRI 2.0 60 MD THK V SUL (STAPLE) ×3 IMPLANT
SEALANT PROGEL (MISCELLANEOUS) ×3 IMPLANT
SEALANT SURG COSEAL 4ML (VASCULAR PRODUCTS) IMPLANT
SOLUTION ANTI FOG 6CC (MISCELLANEOUS) ×3 IMPLANT
SPONGE GAUZE 4X4 12PLY (GAUZE/BANDAGES/DRESSINGS) ×3 IMPLANT
SPONGE TONSIL 1.25 RF SGL STRG (GAUZE/BANDAGES/DRESSINGS) ×3 IMPLANT
STAPLER ENDO GIA 12MM SHORT (STAPLE) ×1 IMPLANT
STAPLER TA30 4.8 NON-ABS (STAPLE) ×3 IMPLANT
SUT CHROMIC 3 0 SH 27 (SUTURE) IMPLANT
SUT ETHILON 3 0 PS 1 (SUTURE) IMPLANT
SUT PROLENE 3 0 SH DA (SUTURE) IMPLANT
SUT PROLENE 4 0 RB 1 (SUTURE) ×2
SUT PROLENE 4-0 RB1 .5 CRCL 36 (SUTURE) ×1 IMPLANT
SUT SILK  1 MH (SUTURE) ×4
SUT SILK 1 MH (SUTURE) ×2 IMPLANT
SUT SILK 2 0SH CR/8 30 (SUTURE) ×3 IMPLANT
SUT SILK 3 0SH CR/8 30 (SUTURE) IMPLANT
SUT VIC AB 1 CTX 18 (SUTURE) ×6 IMPLANT
SUT VIC AB 2 TP1 27 (SUTURE) IMPLANT
SUT VIC AB 2-0 CT1 27 (SUTURE) ×2
SUT VIC AB 2-0 CT1 TAPERPNT 27 (SUTURE) ×1 IMPLANT
SUT VIC AB 2-0 CTX 36 (SUTURE) IMPLANT
SUT VIC AB 3-0 X1 27 (SUTURE) IMPLANT
SUT VICRYL 0 UR6 27IN ABS (SUTURE) IMPLANT
SUT VICRYL 2 TP 1 (SUTURE) ×3 IMPLANT
SWAB COLLECTION DEVICE MRSA (MISCELLANEOUS) IMPLANT
SYSTEM SAHARA CHEST DRAIN ATS (WOUND CARE) ×3 IMPLANT
TAPE CLOTH SURG 4X10 WHT LF (GAUZE/BANDAGES/DRESSINGS) ×3 IMPLANT
TIP APPLICATOR SPRAY EXTEND 16 (VASCULAR PRODUCTS) ×3 IMPLANT
TOWEL NATURAL 4PK STERILE (DISPOSABLE) ×3 IMPLANT
TOWEL OR 17X24 6PK STRL BLUE (TOWEL DISPOSABLE) ×3 IMPLANT
TOWEL OR 17X26 10 PK STRL BLUE (TOWEL DISPOSABLE) ×6 IMPLANT
TRAP SPECIMEN MUCOUS 40CC (MISCELLANEOUS) IMPLANT
TRAY FOLEY CATH 14FRSI W/METER (CATHETERS) ×3 IMPLANT
TUBE ANAEROBIC SPECIMEN COL (MISCELLANEOUS) IMPLANT
TUNNELER SHEATH ON-Q 11GX8 (MISCELLANEOUS) ×3 IMPLANT
WATER STERILE IRR 1000ML POUR (IV SOLUTION) ×6 IMPLANT

## 2011-10-19 NOTE — Anesthesia Postprocedure Evaluation (Signed)
Anesthesia Post Note  Patient: Nicolas Cantu  Procedure(s) Performed: Procedure(s) (LRB): VIDEO ASSISTED THORACOSCOPY (VATS)/ LOBECTOMY (Left)  Anesthesia type: general  Patient location: PACU  Post pain: Pain level controlled  Post assessment: Patient's Cardiovascular Status Stable  Last Vitals:  Filed Vitals:   10/19/11 1130  BP:   Pulse: 103  Temp:   Resp: 13    Post vital signs: Reviewed and stable  Level of consciousness: sedated  Complications: No apparent anesthesia complications

## 2011-10-19 NOTE — Progress Notes (Signed)
cbg on arrival is 103. Nicolas Cantu

## 2011-10-19 NOTE — Anesthesia Preprocedure Evaluation (Addendum)
Anesthesia Evaluation  Patient identified by MRN, date of birth, ID band Patient awake    Reviewed: Allergy & Precautions, H&P , NPO status , Patient's Chart, lab work & pertinent test results  History of Anesthesia Complications Negative for: history of anesthetic complications (no prior surgeries)  Airway Mallampati: II TM Distance: >3 FB Neck ROM: Full    Dental  (+) Teeth Intact   Pulmonary asthma ,          Cardiovascular     Neuro/Psych    GI/Hepatic   Endo/Other    Renal/GU      Musculoskeletal   Abdominal   Peds  Hematology   Anesthesia Other Findings   Reproductive/Obstetrics                           Anesthesia Physical Anesthesia Plan  ASA: III  Anesthesia Plan: General   Post-op Pain Management:    Induction: Intravenous  Airway Management Planned: Double Lumen EBT  Additional Equipment: Arterial line and CVP  Intra-op Plan:   Post-operative Plan: Extubation in OR  Informed Consent: I have reviewed the patients History and Physical, chart, labs and discussed the procedure including the risks, benefits and alternatives for the proposed anesthesia with the patient or authorized representative who has indicated his/her understanding and acceptance.   Dental advisory given  Plan Discussed with: Surgeon and CRNA  Anesthesia Plan Comments:        Anesthesia Quick Evaluation

## 2011-10-19 NOTE — Transfer of Care (Signed)
Immediate Anesthesia Transfer of Care Note  Patient: Nicolas Cantu  Procedure(s) Performed: Procedure(s) (LRB): VIDEO ASSISTED THORACOSCOPY (VATS)/ LOBECTOMY (Left)  Patient Location: PACU  Anesthesia Type: General  Level of Consciousness: awake, alert  and oriented  Airway & Oxygen Therapy: Patient Spontanous Breathing and Patient connected to nasal cannula oxygen  Post-op Assessment: Report given to PACU RN and Post -op Vital signs reviewed and stable  Post vital signs: Reviewed and stable  Complications: No apparent anesthesia complications

## 2011-10-19 NOTE — Progress Notes (Signed)
S/p LUL for lung cancer BP 116/78  Pulse 110  Temp(Src) 99 F (37.2 C) (Oral)  Resp 24  Wt 154 lb 8.7 oz (70.101 kg)  SpO2 96% Breathing comfortably Minimal CT output

## 2011-10-19 NOTE — Op Note (Signed)
Nicolas Cantu, Nicolas Cantu NO.:  [id number]  MEDICAL RECORD NO.:  [id number]  LOCATION:  2315                         FACILITY:  [facility]  PHYSICIAN:  Kerin Perna, M.D.  DATE OF BIRTH:  August 03, 1965  DATE OF PROCEDURE:  10/19/2011 DATE OF DISCHARGE:                              OPERATIVE REPORT   OPERATIONS: 1. Left VATS, mini thoracotomy, left upper lobectomy. 2. Mediastinal lymph node dissection. 3. Placement of On-Q wound irrigation pain system.  SURGEON:  Kerin Perna, M.D.  ASSISTANT:  Rowe Clack, PA-C.  PREOPERATIVE DIAGNOSIS:  Non-small cell carcinoma left upper lobe, clinical stage I.  POSTOPERATIVE DIAGNOSIS:  Non-small cell carcinoma left upper lobe, clinical stage I.  ANESTHESIA:  General by Dr. Michelle Piper.  INDICATIONS:  The patient is a 46 year old Asian male with a history of tuberculosis in the remote past, treated with medication, and minimal smoking exposure.  He has been followed by Dr. Marcelyn Bruins for an enlarging mass in left upper lobe with low activity on PET scan. Surgical resection was recommended due to the progressive increase in size on serial CT scans.  A preoperative brain MRI showed no evidence of neurogenic involvement and mediastinal lymph nodes were not pathologically enlarged.  Prior to surgery, I examined the patient in the office on 2 occasions and reviewed the indications and expected benefits of left VATS, left upper lobectomy for cancer.  He had not been evaluated with a biopsy to establish diagnosis, so the plan was to wedge out the primary for lobectomy.  I discussed the indications and benefits as well as alternatives therapies.  I discussed with him the risks of bleeding, blood transfusion, air leak, pneumonia, ventilator dependence, and death.  He demonstrated his understanding and agreed to proceed with the surgery under what I felt was an informed consent.  PROCEDURE:  The patient was brought to the  operating room and placed supine on the operating room table where general anesthesia was induced. A double-lumen endotracheal tube was positioned by the Anesthesia team. The patient was turned to expose the left side up and proper time-out was performed.  The left chest was prepped and draped as a sterile field.  An incision was made in the 5th interspace at the tip of the scapula and the VATS camera was inserted.  There were minimal adhesions. There were large lymph nodes in the fissure.  The tumor was not visible. The camera was withdrawn.  The incision was extended to approximately 12 cm in length.  The ribs were gently spread, but not divided.  The lung was then visually inspected and the mass was palpated in the lower aspect of the left upper lobe, close to the fissure.  The fissure was dissected and mobilized.  The inferior ligament was taken down and the lower lobe was mobilized.  There were no other palpable suspicious nodules.  The primary was wedged out with the Endo-GIA stapling device and was sent for frozen section.  This returned non-small cell carcinoma of the lung, consistent with adenocarcinoma.  Although, the margins were clear, we proceeded with an anatomic lobectomy as the margins were fairly close grossly.  First the anterior mediastinal pleura was opened and the pulmonary veins to the upper lobe were dissected and isolated with vessel loops and stapled and divided with the Endo-GIA stapling device.  The main PA was then dissected and the 2 branches from the upper aspect of the main PA to the upper lobe were dissected and encircled with vessel loops and stapled and divided with Endo-GIA stapling devices.  The fissure between the upper and lower lobe was completed using Endo-GIA stapling devices. The pulmonary artery in the fissure was dissected out and then 2 additional branches to the upper lobe were identified and doubly ligated and divided.  This left the  bronchus.  There was a large lymph node mass along the bronchus and this was dissected off and sent for Pathology. The bronchus was then stapled and divided with a TL30 stapler with 4.8 mm staple load.  The specimen was then removed as a completion lobectomy.  Large mediastinal lymph node mass in the AP window was dissected and submitted for permanent section.  Air was instilled in the lower lobe and was inflated gently and there is no evidence of air leak.  ProGEL medical adhesive was placed over the staple lines in the left lower lobe.  Two left pleural chest tubes were placed anterior and posterior, brought out through separate incisions.  The lung was inflated under direct vision.  The ribs were approximated with #2 Vicryl.  The muscle layers were closed with interrupted #1 Vicryl.  The skin and subcutaneous layers were closed in running Vicryl.  An On-Q catheter was placed beneath the main incision above the chest tubes and secured to the skin and connected to a 0.5% Marcaine reservoir.  The patient tolerated the procedure well.  It had minimal blood loss. Returned to the recovery room in stable condition.     Kerin Perna, M.D.     PV/MEDQ  D:  10/19/2011  T:  10/19/2011  Job:  [id number]  cc:   Barbaraann Share, MD,FCCP

## 2011-10-19 NOTE — Brief Op Note (Signed)
                   [address]            [place] [address]          [phone]    10/19/2011  10:56 AM  PATIENT:  Glo Herring  46 y.o. male  PRE-OPERATIVE DIAGNOSIS:  LUNG MASS  POST-OPERATIVE DIAGNOSIS:  LUNG MASS  PROCEDURE:  Procedure(s): VIDEO ASSISTED THORACOSCOPY (VATS)/MINI-THORACOTOMY  LOBECTOMY(LUL), LND PLACEMENT OF ON-Q DEVICE  FROZEN: ADENOCARCINOMA  SURGEON:  Surgeon(s): Kerin Perna, MD  PHYSICIAN ASSISTANT: Elson Ulbrich PA-C  ANESTHESIA:   general  SPECIMEN:  Source of Specimen:  LUL, LN'S  DISPOSITION OF SPECIMEN:  Pathology  DRAINS: 2 Chest Tube(s) in the LEFT HEMITHORAX   PATIENT CONDITION:  PACU - hemodynamically stable.  COMPLICATIONS ; NO KNOWN

## 2011-10-19 NOTE — Anesthesia Procedure Notes (Signed)
Procedure Name: Intubation Date/Time: 10/19/2011 7:51 AM Performed by: Arlice Colt B Pre-anesthesia Checklist: Patient identified, Emergency Drugs available, Suction available, Patient being monitored and Timeout performed Patient Re-evaluated:Patient Re-evaluated prior to inductionOxygen Delivery Method: Circle system utilized Preoxygenation: Pre-oxygenation with 100% oxygen Intubation Type: IV induction Ventilation: Mask ventilation without difficulty Laryngoscope Size: Mac and 3 Grade View: Grade I Tube type: Oral Endobronchial tube: Left, Double lumen EBT, EBT position confirmed by auscultation and EBT position confirmed by fiberoptic bronchoscope and 37 Fr Number of attempts: 1 Airway Equipment and Method: Stylet Placement Confirmation: ETT inserted through vocal cords under direct vision,  positive ETCO2 and breath sounds checked- equal and bilateral Tube secured with: Tape Dental Injury: Teeth and Oropharynx as per pre-operative assessment

## 2011-10-19 NOTE — Progress Notes (Signed)
The patient was examined and preop studies reviewed. There has been no change from the prior exam and the patient is ready for surgery.  Plan L VATS and resection of LUL mass

## 2011-10-20 ENCOUNTER — Inpatient Hospital Stay (HOSPITAL_COMMUNITY): Payer: PRIVATE HEALTH INSURANCE

## 2011-10-20 ENCOUNTER — Encounter (HOSPITAL_COMMUNITY): Payer: Self-pay | Admitting: *Deleted

## 2011-10-20 LAB — BASIC METABOLIC PANEL
BUN: 13 mg/dL (ref 6–23)
CO2: 27 mEq/L (ref 19–32)
Calcium: 8.4 mg/dL (ref 8.4–10.5)
Creatinine, Ser: 0.79 mg/dL (ref 0.50–1.35)
GFR calc non Af Amer: >90 mL/min (ref >90)
Glucose, Bld: 122 mg/dL — ABNORMAL HIGH (ref 70–99)

## 2011-10-20 LAB — TYPE AND SCREEN
ABO/RH(D): A POS
Antibody Screen: NEGATIVE
Unit division: 0
Unit division: 0

## 2011-10-20 LAB — POCT I-STAT 3, ART BLOOD GAS (G3+)
Acid-Base Excess: 1 mmol/L (ref 0.0–2.0)
Bicarbonate: 28.7 mEq/L — ABNORMAL HIGH (ref 20.0–24.0)
O2 Saturation: 93 %
TCO2: 30 mmol/L (ref 0–100)
pCO2 arterial: 56.4 mmHg — ABNORMAL HIGH (ref 35.0–45.0)
pH, Arterial: 7.314 — ABNORMAL LOW (ref 7.350–7.450)
pO2, Arterial: 73 mmHg — ABNORMAL LOW (ref 80.0–100.0)

## 2011-10-20 LAB — CBC
Hemoglobin: 12.6 g/dL — ABNORMAL LOW (ref 13.0–17.0)
MCH: 29.3 pg (ref 26.0–34.0)
MCHC: 33.7 g/dL (ref 30.0–36.0)
MCV: 87 fL (ref 78.0–100.0)
RBC: 4.3 MIL/uL (ref 4.22–5.81)

## 2011-10-20 LAB — GLUCOSE, CAPILLARY
Glucose-Capillary: 125 mg/dL — ABNORMAL HIGH (ref 70–99)
Glucose-Capillary: 215 mg/dL — ABNORMAL HIGH (ref 70–99)
Glucose-Capillary: 102 mg/dL — ABNORMAL HIGH (ref 70–99)

## 2011-10-20 MED ORDER — FUROSEMIDE 10 MG/ML IJ SOLN
20.0000 mg | Freq: Once | INTRAMUSCULAR | Status: AC
  Administered 2011-10-20: 20 mg via INTRAVENOUS
  Filled 2011-10-20: qty 2

## 2011-10-20 MED ORDER — INSULIN ASPART 100 UNIT/ML ~~LOC~~ SOLN
0.0000 [IU] | Freq: Four times a day (QID) | SUBCUTANEOUS | Status: DC
  Administered 2011-10-20: 8 [IU] via SUBCUTANEOUS
  Administered 2011-10-20 – 2011-10-23 (×4): 2 [IU] via SUBCUTANEOUS

## 2011-10-20 MED ORDER — POTASSIUM CHLORIDE 10 MEQ/50ML IV SOLN
10.0000 meq | INTRAVENOUS | Status: AC
  Administered 2011-10-20 (×3): 10 meq via INTRAVENOUS

## 2011-10-20 MED ORDER — INSULIN GLARGINE 100 UNIT/ML ~~LOC~~ SOLN
18.0000 [IU] | Freq: Every day | SUBCUTANEOUS | Status: DC
  Administered 2011-10-20 – 2011-10-22 (×3): 18 [IU] via SUBCUTANEOUS

## 2011-10-20 MED ORDER — METOPROLOL TARTRATE 25 MG PO TABS
25.0000 mg | ORAL_TABLET | Freq: Two times a day (BID) | ORAL | Status: DC
  Administered 2011-10-20 – 2011-10-22 (×5): 25 mg via ORAL
  Filled 2011-10-20 (×7): qty 1

## 2011-10-20 MED ORDER — METOPROLOL TARTRATE 12.5 MG HALF TABLET
12.5000 mg | ORAL_TABLET | Freq: Two times a day (BID) | ORAL | Status: DC
  Administered 2011-10-20: 12.5 mg via ORAL
  Filled 2011-10-20 (×2): qty 1

## 2011-10-20 MED ORDER — SODIUM CHLORIDE 0.9 % IV SOLN: INTRAVENOUS | Status: DC

## 2011-10-20 NOTE — Progress Notes (Signed)
UR complete 

## 2011-10-20 NOTE — Care Management Note (Unsigned)
    Page 1 of 1   10/20/2011     4:04:19 PM   CARE MANAGEMENT NOTE 10/20/2011  Patient:  Nicolas Cantu, Nicolas Cantu   Account Number:  [id number]  Date Initiated:  10/20/2011  Documentation initiated by:  Carlyle Lipa  Subjective/Objective Assessment:   VATS for lung nodule; transfer to stepdown unit on POD 1     Action/Plan:   home when stable postop   Anticipated DC Date:  10/24/2011   Anticipated DC Plan:  HOME/SELF CARE      DC Planning Services  CM consult      Choice offered to / List presented to:             Status of service:  In process, will continue to follow Medicare Important Message given?   (If response is "NO", the following Medicare IM given date fields will be blank) Date Medicare IM given:   Date Additional Medicare IM given:    Discharge Disposition:    Per UR Regulation:  Reviewed for med. necessity/level of care/duration of stay  If discussed at Long Length of Stay Meetings, dates discussed:    Comments:  10/20/11 Nicolas Ladnier,RN,BSN 1600 MET WITH PT TO DISCUSS DC PLANS.  PT INDEPENDENT, PTA; SPOUSE AND DAUGHTER TO PROVIDE CARE AT DC.  WILL FOLLOW FOR HOME NEEDS AS PT PROGRESSES.

## 2011-10-20 NOTE — Progress Notes (Signed)
1 Day Post-Op Procedure(s) (LRB): VIDEO ASSISTED THORACOSCOPY (VATS)/ LOBECTOMY (Left)                    [address]            [place] [address]          [phone]      Subjective: LUL lobectomy non small cell Ca  Objective: Vital signs in last 24 hours: Temp:  [97 F (36.1 C)-100.1 F (37.8 C)] 98.8 F (37.1 C) (05/15 0719) Pulse Rate:  [103-121] 116  (05/15 0800) Cardiac Rhythm:  [-] Sinus tachycardia;Normal sinus rhythm (05/15 0800) Resp:  [10-30] 20  (05/15 0800) BP: (95-134)/(59-87) 132/80 mmHg (05/15 0800) SpO2:  [93 %-100 %] 96 % (05/15 0800) Arterial Line BP: (104-152)/(60-83) 151/81 mmHg (05/15 0800) Weight:  [154 lb 8.7 oz (70.101 kg)-158 lb 15.2 oz (72.1 kg)] 158 lb 15.2 oz (72.1 kg) (05/15 0600)  Hemodynamic parameters for last 24 hours:  sinus tach  Intake/Output from previous day: 05/14 0701 - 05/15 0700 In: 5074.8 [P.O.:210; I.V.:4464.8; IV Piggyback:400] Out: 2305 [Urine:1635; Blood:50; Chest Tube:620] Intake/Output this shift: Total I/O In: 120 [P.O.:120] Out: 275 [Urine:175; Chest Tube:100]  EXAM Lungs clear No air leak CXR min atelectasis L base  Lab Results:  Basename 10/20/11 0331  WBC 11.3*  HGB 12.6*  HCT 37.4*  PLT 218   BMET:  Basename 10/20/11 0331  NA 133*  K 3.6  CL 96  CO2 27  GLUCOSE 122*  BUN 13  CREATININE 0.79  CALCIUM 8.4    PT/INR: No results found for this basename: LABPROT,INR in the last 72 hours ABG    Component Value Date/Time   PHART 7.314* 10/20/2011 0342   HCO3 28.7* 10/20/2011 0342   TCO2 30 10/20/2011 0342   ACIDBASEDEF 1.6 10/19/2011 1140   O2SAT 93.0 10/20/2011 0342   CBG (last 3)   Basename 10/20/11 0316 10/19/11 2323 10/19/11 1838  GLUCAP 125* 122* 156*    Assessment/Plan: S/P Procedure(s) (LRB): VIDEO ASSISTED THORACOSCOPY (VATS)/ LOBECTOMY (Left) DM controlled with Lantus and SSI Transfer to 3300   LOS: 1 day    [name],Sicilia Killough 10/20/2011

## 2011-10-20 NOTE — Progress Notes (Signed)
Patient ID: Nicolas Cantu, male   DOB: 1966-03-23, 46 y.o.   MRN: [id number]   Filed Vitals:   10/20/11 1845 10/20/11 1900 10/20/11 1915 10/20/11 2000  BP:  126/76  123/94  Pulse: 128 116 115 114  Temp:    98.5 F (36.9 C)  TempSrc:    Oral  Resp: 28 20 18 18   Weight:      SpO2: 92% 92% 94% 97%   Diuresed well today.  Awake, alert, comfortable.

## 2011-10-20 NOTE — Progress Notes (Signed)
Upon review of this AM's ABG results, patient encouraged to cough, deep breath, use incentive spirometer more frequently, and encouraged to get out of bed and into chair this AM.  Pt verbalized understanding and agreed to comply with recommendations.  Will continue to monitor.

## 2011-10-21 ENCOUNTER — Inpatient Hospital Stay (HOSPITAL_COMMUNITY): Payer: PRIVATE HEALTH INSURANCE

## 2011-10-21 LAB — COMPREHENSIVE METABOLIC PANEL
ALT: 26 U/L (ref 0–53)
AST: 38 U/L — ABNORMAL HIGH (ref 0–37)
Albumin: 3.2 g/dL — ABNORMAL LOW (ref 3.5–5.2)
Alkaline Phosphatase: 65 U/L (ref 39–117)
BUN: 9 mg/dL (ref 6–23)
CO2: 31 mEq/L (ref 19–32)
Calcium: 9.1 mg/dL (ref 8.4–10.5)
Chloride: 96 mEq/L (ref 96–112)
Creatinine, Ser: 0.82 mg/dL (ref 0.50–1.35)
GFR calc Af Amer: >90 mL/min (ref >90)
GFR calc non Af Amer: >90 mL/min (ref >90)
Glucose, Bld: 120 mg/dL — ABNORMAL HIGH (ref 70–99)
Potassium: 3.9 mEq/L (ref 3.5–5.1)
Sodium: 136 mEq/L (ref 135–145)
Total Bilirubin: 0.8 mg/dL (ref 0.3–1.2)
Total Protein: 6.9 g/dL (ref 6.0–8.3)

## 2011-10-21 LAB — GLUCOSE, CAPILLARY
Glucose-Capillary: 98 mg/dL (ref 70–99)
Glucose-Capillary: 121 mg/dL — ABNORMAL HIGH (ref 70–99)
Glucose-Capillary: 101 mg/dL — ABNORMAL HIGH (ref 70–99)
Glucose-Capillary: 131 mg/dL — ABNORMAL HIGH (ref 70–99)
Glucose-Capillary: 110 mg/dL — ABNORMAL HIGH (ref 70–99)

## 2011-10-21 LAB — CBC
HCT: 40.3 % (ref 39.0–52.0)
Hemoglobin: 13.4 g/dL (ref 13.0–17.0)
MCH: 29.4 pg (ref 26.0–34.0)
MCHC: 33.3 g/dL (ref 30.0–36.0)
MCV: 88.4 fL (ref 78.0–100.0)
Platelets: 207 10*3/uL (ref 150–400)
RBC: 4.56 MIL/uL (ref 4.22–5.81)
RDW: 12.1 % (ref 11.5–15.5)
WBC: 10.8 10*3/uL — ABNORMAL HIGH (ref 4.0–10.5)

## 2011-10-21 NOTE — Progress Notes (Signed)
Status post left upper lobectomy for stage I A. non-small cell carcinoma Posterior chest tube removed, minimal chest tube drainage day Lungs clear to exam Postthoracotomy pain well-controlled Stable day

## 2011-10-21 NOTE — Progress Notes (Signed)
2 Days Post-Op Procedure(s) (LRB): VIDEO ASSISTED THORACOSCOPY (VATS)/ LOBECTOMY (Left) Subjective:                   [address]            [place] [address]          [phone]    POD2 LUL lobectomy Objective:                                                                   PATH- Stage 1-A No air leak CXR min atelectasis                                                                                        Vital signs in last 24 hours: Temp:  [97.7 F (36.5 C)-101.9 F (38.8 C)] 97.7 F (36.5 C) (05/16 0734) Pulse Rate:  [86-138] 96  (05/16 0800) Cardiac Rhythm:  [-] Normal sinus rhythm (05/16 0700) Resp:  [10-29] 21  (05/16 0800) BP: (103-148)/(67-94) 118/75 mmHg (05/16 0800) SpO2:  [87 %-99 %] 94 % (05/16 0800) Arterial Line BP: (144-151)/(83-89) 151/83 mmHg (05/15 0900) Weight:  [153 lb 9.6 oz (69.673 kg)] 153 lb 9.6 oz (69.673 kg) (05/16 0500)  Hemodynamic parameters for last 24 hours:    Intake/Output from previous day: 05/15 0701 - 05/16 0700 In: 1404.9 [P.O.:480; I.V.:772.9; IV Piggyback:152] Out: 5725 [Urine:5175; Chest Tube:550] Intake/Output this shift: Total I/O In: 20 [I.V.:20] Out: 50 [Urine:50]  EXAM- clear breath sounds     NSR  Lab Results:  Basename 10/21/11 0400 10/20/11 0331  WBC 10.8* 11.3*  HGB 13.4 12.6*  HCT 40.3 37.4*  PLT 207 218   BMET:  Basename 10/21/11 0400 10/20/11 0331  NA 136 133*  K 3.9 3.6  CL 96 96  CO2 31 27  GLUCOSE 120* 122*  BUN 9 13  CREATININE 0.82 0.79  CALCIUM 9.1 8.4    PT/INR: No results found for this basename: LABPROT,INR in the last 72 hours ABG    Component Value Date/Time   PHART 7.314* 10/20/2011 0342   HCO3 28.7* 10/20/2011 0342   TCO2 30 10/20/2011 0342   ACIDBASEDEF 1.6 10/19/2011 1140   O2SAT 93.0 10/20/2011 0342   CBG (last 3)   Basename 10/21/11 0653 10/21/11 0015 10/20/11 2250  GLUCAP 121* 98 102*    Assessment/Plan: S/P Procedure(s) (LRB): VIDEO ASSISTED  THORACOSCOPY (VATS)/ LOBECTOMY (Left) PLAN - DC post CT, DC foley   LOS: 2 days    [name],Devun Anna 10/21/2011

## 2011-10-22 ENCOUNTER — Inpatient Hospital Stay (HOSPITAL_COMMUNITY): Payer: PRIVATE HEALTH INSURANCE

## 2011-10-22 LAB — CBC
HCT: 38.1 % — ABNORMAL LOW (ref 39.0–52.0)
Hemoglobin: 13 g/dL (ref 13.0–17.0)
MCH: 29.8 pg (ref 26.0–34.0)
MCHC: 34.1 g/dL (ref 30.0–36.0)
MCV: 87.4 fL (ref 78.0–100.0)
Platelets: 221 10*3/uL (ref 150–400)
RBC: 4.36 MIL/uL (ref 4.22–5.81)
RDW: 11.9 % (ref 11.5–15.5)
WBC: 9.8 10*3/uL (ref 4.0–10.5)

## 2011-10-22 LAB — GLUCOSE, CAPILLARY
Glucose-Capillary: 123 mg/dL — ABNORMAL HIGH (ref 70–99)
Glucose-Capillary: 154 mg/dL — ABNORMAL HIGH (ref 70–99)
Glucose-Capillary: 110 mg/dL — ABNORMAL HIGH (ref 70–99)
Glucose-Capillary: 126 mg/dL — ABNORMAL HIGH (ref 70–99)
Glucose-Capillary: 106 mg/dL — ABNORMAL HIGH (ref 70–99)
Glucose-Capillary: 147 mg/dL — ABNORMAL HIGH (ref 70–99)

## 2011-10-22 LAB — BASIC METABOLIC PANEL
BUN: 8 mg/dL (ref 6–23)
CO2: 32 mEq/L (ref 19–32)
Calcium: 8.9 mg/dL (ref 8.4–10.5)
Chloride: 95 mEq/L — ABNORMAL LOW (ref 96–112)
Creatinine, Ser: 0.85 mg/dL (ref 0.50–1.35)
GFR calc Af Amer: >90 mL/min (ref >90)
GFR calc non Af Amer: >90 mL/min (ref >90)
Glucose, Bld: 112 mg/dL — ABNORMAL HIGH (ref 70–99)
Potassium: 3.7 mEq/L (ref 3.5–5.1)
Sodium: 135 mEq/L (ref 135–145)

## 2011-10-22 MED ORDER — POTASSIUM CHLORIDE 10 MEQ/50ML IV SOLN
INTRAVENOUS | Status: AC
  Administered 2011-10-22: 10 meq via INTRAVENOUS
  Filled 2011-10-22: qty 150

## 2011-10-22 MED ORDER — POTASSIUM CHLORIDE 10 MEQ/50ML IV SOLN
10.0000 meq | INTRAVENOUS | Status: AC | PRN
  Administered 2011-10-22 (×3): 10 meq via INTRAVENOUS

## 2011-10-22 MED ORDER — METOPROLOL TARTRATE 25 MG PO TABS: 25.0000 mg | ORAL_TABLET | Freq: Two times a day (BID) | ORAL | Status: DC

## 2011-10-22 MED ORDER — MAGNESIUM HYDROXIDE 400 MG/5ML PO SUSP: 30.0000 mL | Freq: Every day | ORAL | Status: DC | PRN

## 2011-10-22 MED ORDER — OXYCODONE-ACETAMINOPHEN 5-325 MG PO TABS: 1.0000 | ORAL_TABLET | ORAL | Status: AC | PRN

## 2011-10-22 MED ORDER — DOCUSATE SODIUM 100 MG PO CAPS
100.0000 mg | ORAL_CAPSULE | Freq: Two times a day (BID) | ORAL | Status: DC | PRN
  Administered 2011-10-22: 100 mg via ORAL
  Filled 2011-10-22: qty 1

## 2011-10-22 NOTE — Progress Notes (Signed)
Transferred to 2003 via wheelchair.  No changes.

## 2011-10-22 NOTE — Progress Notes (Signed)
3 Days Post-Op Procedure(s) (LRB): VIDEO ASSISTED THORACOSCOPY (VATS)/ LOBECTOMY (Left)                     [address]            [place] [address]          [phone]      SubjectivePOD 3 LULlobectomy- Ia nonsmall cell Ca  Objective: Vital signs in last 24 hours: Temp:  [98 F (36.7 C)-99.2 F (37.3 C)] 99.2 F (37.3 C) (05/17 0727) Pulse Rate:  [72-105] 94  (05/17 0700) Cardiac Rhythm:  [-] Normal sinus rhythm (05/17 0400) Resp:  [10-24] 22  (05/17 0700) BP: (91-139)/(58-105) 134/84 mmHg (05/17 0700) SpO2:  [93 %-99 %] 99 % (05/17 0700) Weight:  [153 lb 7 oz (69.6 kg)] 153 lb 7 oz (69.6 kg) (05/17 0600)  Hemodynamic parameters for last 24 hours:  NSR  Intake/Output from previous day: 05/16 0701 - 05/17 0700 In: 1928 [P.O.:1400; I.V.:478; IV Piggyback:50] Out: 2540 [Urine:2350; Chest Tube:190] Intake/Output this shift:    EXAM- lungs clear, no air leak, CXR no space  Lab Results:  Basename 10/22/11 0415 10/21/11 0400  WBC 9.8 10.8*  HGB 13.0 13.4  HCT 38.1* 40.3  PLT 221 207   BMET:  Basename 10/22/11 0415 10/21/11 0400  NA 135 136  K 3.7 3.9  CL 95* 96  CO2 32 31  GLUCOSE 112* 120*  BUN 8 9  CREATININE 0.85 0.82  CALCIUM 8.9 9.1    PT/INR: No results found for this basename: LABPROT,INR in the last 72 hours ABG    Component Value Date/Time   PHART 7.314* 10/20/2011 0342   HCO3 28.7* 10/20/2011 0342   TCO2 30 10/20/2011 0342   ACIDBASEDEF 1.6 10/19/2011 1140   O2SAT 93.0 10/20/2011 0342   CBG (last 3)   Basename 10/22/11 0643 10/21/11 2146 10/21/11 1756  GLUCAP 110* 110* 131*    Assessment/Plan: S/P Procedure(s) (LRB): VIDEO ASSISTED THORACOSCOPY (VATS)/ LOBECTOMY (Left) Plan for transfer to step-down: see transfer orders DC chest tube and PCA             LOS: 3 days    [name],Elin Seats 10/22/2011

## 2011-10-22 NOTE — Progress Notes (Signed)
UR Completed.    Connell Bognar Wise Teressa Mcglocklin, RN, BSN Phone #[phone] 

## 2011-10-22 NOTE — Discharge Instructions (Signed)
ACTIVITY:  1.Increase activity slowly. 2.Walk daily and increase frequency and duration as tolerates. 3.May walk up steps. 4.No lifting more than ten pounds for two weeks. 5.No driving for two weeks. 6.Avoid straining. 7.STOP any activity that causes chest pain, shortness of breath, dizziness,sweating, or excessive weakness. 8.Continue with breathing exercises daily.  DIET:low salt and diabetic  WOUND:  1.May shower. 2.Clean wounds with mild soap and water.  Call the office at [phone] if any problems arise.  Thoracotomy Care After Refer to this sheet in the next few weeks. These instructions provide you with information on caring for yourself after your procedure. Your caregiver may also give you more specific instructions. Your treatment has been planned according to current medical practices, but problems sometimes occur. Call your caregiver if you have any problems or questions after your procedure. HOME CARE INSTRUCTIONS  Remove the bandage (dressing) over your chest tube site as directed by your caregiver.   It is normal to be sore for a couple weeks following surgery. See your caregiver if this seems to be getting worse rather than better.   Only take over-the-counter or prescription medicines for pain, discomfort, or fever as directed by your caregiver. It is very important to take pain medicine when you need it so that you will cough and breathe deeply enough to clear mucus (phlegm) and expand your lungs. This helps prevent a lung infection (pneumonia).   If it hurts to cough, hold a pillow against your chest when you cough. This may help with the discomfort. In spite of the discomfort, cough frequently.   Taking deep breaths keeps lungs inflated and protects against pneumonia. Most patients will go home with a device called an incentive spirometer that encourages deep breathing.   You may resume a normal diet and activities as directed.   Use showers for bathing until  you see your caregiver, or as instructed.   Change dressings if necessary or as directed.   Avoid lifting or driving until you are instructed otherwise.   Make an appointment to see your caregiver for stitch (suture) or staple removal when instructed.   Do not travel by airplane for 2 weeks after the chest tube is removed.  SEEK MEDICAL CARE IF:  You are bleeding from your wounds.   Your heartbeat seems irregular.   You have redness, swelling, or increasing pain in the wounds.   There is pus coming from your wounds.   There is a bad smell coming from the wound or dressing.  SEEK IMMEDIATE MEDICAL CARE IF:  You have a fever.   You develop a rash.   You have difficulty breathing.   You develop any reaction or side effects to medicines given.   You develop lightheadedness or feel faint.   You develop shortness of breath or chest pain.  MAKE SURE YOU:  Understand these instructions.   Will watch your condition.   Will get help right away if you are not doing well or get worse.  Document Released: 11/06/2010 Document Revised: 05/13/2011 Document Reviewed: 11/06/2010 [facility] Patient Information 2012 [place], [place].

## 2011-10-23 ENCOUNTER — Inpatient Hospital Stay (HOSPITAL_COMMUNITY): Payer: PRIVATE HEALTH INSURANCE

## 2011-10-23 LAB — BASIC METABOLIC PANEL
BUN: 9 mg/dL (ref 6–23)
CO2: 29 mEq/L (ref 19–32)
Calcium: 8.9 mg/dL (ref 8.4–10.5)
Chloride: 99 mEq/L (ref 96–112)
Creatinine, Ser: 0.88 mg/dL (ref 0.50–1.35)
GFR calc Af Amer: >90 mL/min (ref >90)
GFR calc non Af Amer: >90 mL/min (ref >90)
Glucose, Bld: 96 mg/dL (ref 70–99)
Potassium: 3.6 mEq/L (ref 3.5–5.1)
Sodium: 137 mEq/L (ref 135–145)

## 2011-10-23 LAB — CBC
HCT: 35 % — ABNORMAL LOW (ref 39.0–52.0)
Hemoglobin: 11.7 g/dL — ABNORMAL LOW (ref 13.0–17.0)
MCH: 29 pg (ref 26.0–34.0)
MCHC: 33.4 g/dL (ref 30.0–36.0)
MCV: 86.8 fL (ref 78.0–100.0)
Platelets: 230 10*3/uL (ref 150–400)
RBC: 4.03 MIL/uL — ABNORMAL LOW (ref 4.22–5.81)
RDW: 11.9 % (ref 11.5–15.5)
WBC: 7.3 10*3/uL (ref 4.0–10.5)

## 2011-10-23 LAB — GLUCOSE, CAPILLARY
Glucose-Capillary: 136 mg/dL — ABNORMAL HIGH (ref 70–99)
Glucose-Capillary: 96 mg/dL (ref 70–99)

## 2011-10-23 MED ORDER — POTASSIUM CHLORIDE CRYS ER 20 MEQ PO TBCR
40.0000 meq | EXTENDED_RELEASE_TABLET | Freq: Once | ORAL | Status: AC
  Administered 2011-10-23: 40 meq via ORAL
  Filled 2011-10-23: qty 2

## 2011-10-23 NOTE — Progress Notes (Addendum)
4 Days Post-Op Procedure(s) (LRB): VIDEO ASSISTED THORACOSCOPY (VATS)/ LOBECTOMY (Left)  Subjective: Patient without complaints and would like to go home.  Objective: Vital signs in last 24 hours: Patient Vitals for the past 24 hrs:  BP Temp Temp src Pulse Resp SpO2 Height Weight  10/23/11 0530 102/77 mmHg 98.2 F (36.8 C) Oral 76  20  96 % - -  10/22/11 2017 108/72 mmHg 98.3 F (36.8 C) Oral 97  20  97 % - -  10/22/11 1400 135/75 mmHg 98 F (36.7 C) Oral 100  18  94 % - -  10/22/11 1119 130/78 mmHg 98.2 F (36.8 C) Oral 102  20  95 % - -  10/22/11 1059 - - - - - - 5\' 3"  (1.6 m) 153 lb 7 oz (69.6 kg)  10/22/11 1000 147/76 mmHg - - 105  22  97 % - -  10/22/11 0900 129/82 mmHg - - 111  15  94 % - -  10/22/11 0800 141/91 mmHg - - 101  34  91 % - -    Current Weight  10/22/11 153 lb 7 oz (69.6 kg)      Intake/Output from previous day: 05/17 0701 - 05/18 0700 In: 860 [P.O.:720; I.V.:40; IV Piggyback:100] Out: -    Physical Exam:  Cardiovascular: RRR, no murmurs, gallops, or rubs. Pulmonary: Clear to auscultation bilaterally; no rales, wheezes, or rhonchi. Abdomen: Soft, non tender, bowel sounds present. Extremities: Mild bilateral lower extremity edema. Wounds: Clean and dry.  No erythema or signs of infection.  Lab Results: CBC: Basename 10/23/11 0500 10/22/11 0415  WBC 7.3 9.8  HGB 11.7* 13.0  HCT 35.0* 38.1*  PLT 230 221   BMET:  Basename 10/23/11 0500 10/22/11 0415  NA 137 135  K 3.6 3.7  CL 99 95*  CO2 29 32  GLUCOSE 96 112*  BUN 9 8  CREATININE 0.88 0.85  CALCIUM 8.9 8.9    PT/INR: No results found for this basename: LABPROT,INR in the last 72 hours ABG:  INR: Will add last result for INR, ABG once components are confirmed Will add last 4 CBG results once components are confirmed  Assessment/Plan:  1.Pulmonary-CXR this am shows improvement in aeration of left lung, small left pleural effusion with atelectasis, and no pneumothorax. 2.DM-HGA1C  done last year was 6.1.Will need to follow up with medical doctor as outpatient. 3.Mild ABL anemia-H/H this am 11.7/35. 4.Supplement potassium. 5.Discharge today.  Dorsey Authement MPA-C 10/23/2011

## 2011-10-23 NOTE — Discharge Summary (Signed)
Physician Discharge Summary  Patient ID: ISSAIH KAUS MRN: [id number] DOB/AGE: March 15, 1966 46 y.o.  Admit date: 10/19/2011 Discharge date: 10/23/2011  Admission Diagnoses: 1.Left upper lobe mass 2.History of uncontrolled DM 3.History of hyperlipidemia 4.History of asthma 5.OSA bilateral knees  Discharge Diagnoses:  1.Non small cell cancer of LUL 2.History of uncontrolled DM 3.History of hyperlipidemia 4.History of asthma 5.OSA bilateral knees  Procedure:  1. Left VATS, mini thoracotomy, left upper lobectomy.  2. Mediastinal lymph node dissection.  3. Placement of On-Q wound irrigation pain system by Dr. Donata Clay on 10/19/2011.  Pathology: Left upper lobe wedge resection showed invasive, well-differentiated adenocarcinoma. 5 lymph nodes were negative for tumor. TNM Code: pT1a, pN0  History of Presenting Illness: This is a 46 year old [place] male who was found to have a 2 cm left upper lobe nodule that recently increased in size from 1.2 cm (2012). He had a PET scan done with mild hypermetabolic activity (SUV max 2.9). The patient was then seen in consultation in the office on 10/08/2011  to discuss surgical resection. He has no coronary symptoms and has never been a smoker. His occupation involves metal work, mainly Development worker, community, and he is not exposed to pulmonary toxins. Approximately 20 years ago, he had tuberculosis in [place]. He was treated with oral TB medications for one year with resolution. He is asymptomatic. He has had no recent symptoms of bronchitis, sinusitis, cough, fever, night sweats, weight loss, chest pain. Potential risks, complications, benefits of the surgery discussed with the patient agreed to proceed. He was admitted to [facility] on 10/19/2011 order to undergo a left VATS, left miniature thoracotomy, left upper old lobectomy with lymph node dissection, and On-Q placement.   Brief Hospital Course:  He remained afebrile and hemodynamic is stable. His A-line was removed  on postoperative #1. Daily chest x-rays were obtained and remained stable. His chest tubes did not have an air leak and had minimal drainage. His Foley and posterior chest tube removed on postoperative day #2. His remaining chest tube,PCA, as well as On-Q were removed on postoperative #3. He was felt surgically stable for transfer from the intensive care unit to 2000 for further convalescence. He's been tolerating a diet and has had a bowel movement. He is ambulating without difficulty. Chest x-ray done she today showed no pneumothorax, small left pleural effusion and mild elevation left hemidiaphragm. He is felt surgically stable for discharge today.    Latest Vital Signs: Blood pressure 102/77, pulse 76, temperature 98.2 F (36.8 C), temperature source Oral, resp. rate 20, height 5\' 3"  (1.6 m), weight 153 lb 7 oz (69.6 kg), SpO2 96.00%.  Physical Exam: Cardiovascular: RRR, no murmurs, gallops, or rubs.  Pulmonary: Clear to auscultation bilaterally; no rales, wheezes, or rhonchi.  Abdomen: Soft, non tender, bowel sounds present.  Extremities: Mild bilateral lower extremity edema.  Wounds: Clean and dry. No erythema or signs of infection.   Discharge Condition:Stable  Recent laboratory studies:  Lab Results  Component Value Date   WBC 7.3 10/23/2011   HGB 11.7* 10/23/2011   HCT 35.0* 10/23/2011   MCV 86.8 10/23/2011   PLT 230 10/23/2011   Lab Results  Component Value Date   NA 137 10/23/2011   K 3.6 10/23/2011   CL 99 10/23/2011   CO2 29 10/23/2011   CREATININE 0.88 10/23/2011   GLUCOSE 96 10/23/2011     Diagnostic Studies: Dg Chest 2 View  10/23/2011  *RADIOLOGY REPORT*  Clinical Data: Post lobectomy  CHEST -  2 VIEW  Comparison: 10/22/2011; 10/21/2011; 10/15/2011  Findings:  Grossly unchanged cardiac silhouette and mediastinal contours with postsurgical change of the left hilum.  Interval removal of left- sided chest tube.  Unchanged small left-sided effusion with fluid layering along the  left lung apex.  No definite pneumothorax. Unchanged mild elevation of the left hemidiaphragm with left basilar heterogeneous opacities.  The right hemithorax is unchanged.  Unchanged bones.  IMPRESSION: 1.  Interval removal of a left-sided chest tube.  No definite pneumothorax. 2.  Grossly unchanged left basilar heterogeneous opacities with small amount of pleural fluid layering along the left lateral lung and left lung apex.  3.  Stable postsurgical change of the right hilum.  Original Report Authenticated By: Waynard Reeds, M.D.   Discharge Orders    Future Appointments: Provider: Department: Dept Phone: Center:   11/12/2011 12:30 PM Kerin Perna, MD [facility] [phone] [facility]      Discharge Medications: Medication List  As of 10/23/2011  2:23 PM   TAKE these medications         aspirin 81 MG EC tablet   Take 81 mg by mouth daily.      atorvastatin 20 MG tablet   Commonly known as: LIPITOR   Take 1 tablet (20 mg total) by mouth daily.      metoprolol tartrate 25 MG tablet   Commonly known as: LOPRESSOR   Take 1 tablet (25 mg total) by mouth 2 (two) times daily.      multivitamin capsule   Take 1 capsule by mouth daily.      oxyCODONE-acetaminophen 5-325 MG per tablet   Commonly known as: PERCOCET   Take 1-2 tablets by mouth every 4 (four) hours as needed for pain.            Follow Up Appointments: Follow-up Information    Follow up with VAN Dinah Beers, MD. (PA/LAT CXR (in same building as Dr. Zenaida Niece Trigt's office)to be taken on 11/12/2011 at 11:30 am;Appointment with Dr. Donata Clay is on 11/12/2011 at 12:30pm)    Contact information:   [address] [place] [address] [phone]       Follow up with Dr. Oliver Barre. (Call for follow up appointment 1-2 weeks regarding diabetes management)          Signed: Raybon Conard MPA-C 10/23/2011, 2:23 PM

## 2011-10-26 NOTE — Discharge Summary (Signed)
patient examined and medical record reviewed,agree with above note. [name],Nicolas Cantu 10/26/2011

## 2011-10-28 ENCOUNTER — Ambulatory Visit
Admission: RE | Admit: 2011-10-28 | Discharge: 2011-10-28 | Disposition: A | Discharge status: Home / Self Care | Payer: PRIVATE HEALTH INSURANCE | Source: Ambulatory Visit | Attending: Cardiothoracic Surgery | Admitting: Cardiothoracic Surgery

## 2011-10-28 ENCOUNTER — Other Ambulatory Visit: Payer: Self-pay | Admitting: *Deleted

## 2011-10-28 ENCOUNTER — Other Ambulatory Visit: Payer: Self-pay | Admitting: Cardiothoracic Surgery

## 2011-10-28 ENCOUNTER — Ambulatory Visit (INDEPENDENT_AMBULATORY_CARE_PROVIDER_SITE_OTHER): Payer: PRIVATE HEALTH INSURANCE | Admitting: Physician Assistant

## 2011-10-28 ENCOUNTER — Encounter: Payer: Self-pay | Admitting: Cardiothoracic Surgery

## 2011-10-28 VITALS — BP 113/71 | HR 89 | Temp 98.2°F | Resp 18 | Ht 63.0 in | Wt 150.0 lb

## 2011-10-28 DIAGNOSIS — R05 Cough: Secondary | ICD-10-CM

## 2011-10-28 DIAGNOSIS — R059 Cough, unspecified: Secondary | ICD-10-CM | POA: Insufficient documentation

## 2011-10-28 DIAGNOSIS — G8918 Other acute postprocedural pain: Secondary | ICD-10-CM

## 2011-10-28 DIAGNOSIS — C3410 Malignant neoplasm of upper lobe, unspecified bronchus or lung: Secondary | ICD-10-CM

## 2011-10-28 DIAGNOSIS — Z09 Encounter for follow-up examination after completed treatment for conditions other than malignant neoplasm: Secondary | ICD-10-CM

## 2011-10-28 MED ORDER — GUAIFENESIN ER 600 MG PO TB12: 1200.0000 mg | ORAL_TABLET | Freq: Two times a day (BID) | ORAL | Status: DC

## 2011-10-28 MED ORDER — HYDROCODONE-ACETAMINOPHEN 7.5-500 MG PO TABS: 1.0000 | ORAL_TABLET | Freq: Four times a day (QID) | ORAL | Status: DC | PRN

## 2011-10-28 NOTE — Progress Notes (Addendum)
  HPI:  Mr. Knoll reports to the office today with a complaint of cough.  He underwent VATS on 10/19/2011 and underwent VATS with LU Lobectomy.  He was originally scheduled to see Dr. Morton Peters on 11/12/2011.  The patient states that he has had a cough since surgery.  He denies any sputum production.  He also denies fevers, chills, or sweats.  He denies shortness of breath and LE edema.  He has not taken anything to try and relieve his cough.  The patient questions the need for a possible antibiotic.    Current Outpatient Prescriptions  Medication Sig Dispense Refill  . aspirin 81 MG EC tablet Take 81 mg by mouth daily.        Marland Kitchen atorvastatin (LIPITOR) 20 MG tablet Take 1 tablet (20 mg total) by mouth daily.  90 tablet  3  . metoprolol tartrate (LOPRESSOR) 25 MG tablet Take 1 tablet (25 mg total) by mouth 2 (two) times daily.  60 tablet  1  . Multiple Vitamin (MULTIVITAMIN) capsule Take 1 capsule by mouth daily.      Marland Kitchen oxyCODONE-acetaminophen (PERCOCET) 5-325 MG per tablet Take 1-2 tablets by mouth every 4 (four) hours as needed for pain.  45 tablet  0  . guaiFENesin (MUCINEX) 600 MG 12 hr tablet Take 2 tablets (1,200 mg total) by mouth 2 (two) times daily.  120 tablet  0    Physical Exam:  BP 113/71  Pulse 89  Temp(Src) 98.2 F (36.8 C) (Oral)  Resp 18  Ht 5\' 3"  (1.6 m)  Wt 150 lb (68.04 kg)  BMI 26.57 kg/m2  SpO2 98%  Gen: no apparent distress, patient does have a dry cough Heart: RRR,  Lungs: CTA bilaterally, no wheezes or rhonchi appreciated Incisions: clean and dry  Diagnostic Tests:  CXR: increased accumulation of left pleural fluid remains small in size, stable post surgical changes, no evidence of lung infiltrate  Impression:  Mr. Nick is S/P VATS with LU Lobectomy from 10/19/2011 who presents with complaint of cough.   Plan:  Mr. Sokolov does have a cough.  However, I do not feel that an infection is the source.  The cough is dry and his lungs sounds are clear on examination.  His  chest xray does also not indicate the presence of a pneumonia.  Being the patient has not tried to resolve his cough with use of a cough suppressant, I will write him a prescription for Mucinex.  The patient was instructed to trial this over the weekend and if his cough does not improve then he should call the office.  The patient was also instructed to notify the office should his cough become productive of green or yellow sputum or should he develop fevers or chills.  He will otherwise follow up with Dr. Donata Clay on 11/12/2011

## 2011-11-04 ENCOUNTER — Other Ambulatory Visit: Payer: Self-pay | Admitting: *Deleted

## 2011-11-04 DIAGNOSIS — G8918 Other acute postprocedural pain: Secondary | ICD-10-CM

## 2011-11-04 MED ORDER — HYDROCODONE-ACETAMINOPHEN 7.5-500 MG PO TABS: 1.0000 | ORAL_TABLET | Freq: Four times a day (QID) | ORAL | Status: AC | PRN

## 2011-11-08 ENCOUNTER — Other Ambulatory Visit: Payer: Self-pay | Admitting: Cardiothoracic Surgery

## 2011-11-08 DIAGNOSIS — D381 Neoplasm of uncertain behavior of trachea, bronchus and lung: Secondary | ICD-10-CM

## 2011-11-12 ENCOUNTER — Other Ambulatory Visit: Payer: Self-pay | Admitting: Cardiothoracic Surgery

## 2011-11-12 ENCOUNTER — Encounter: Payer: Self-pay | Admitting: Cardiothoracic Surgery

## 2011-11-12 ENCOUNTER — Ambulatory Visit (HOSPITAL_COMMUNITY)
Admission: RE | Admit: 2011-11-12 | Discharge: 2011-11-12 | Disposition: A | Discharge status: Home / Self Care | Payer: PRIVATE HEALTH INSURANCE | Source: Ambulatory Visit | Attending: Cardiothoracic Surgery | Admitting: Cardiothoracic Surgery

## 2011-11-12 ENCOUNTER — Ambulatory Visit
Admission: RE | Admit: 2011-11-12 | Discharge: 2011-11-12 | Disposition: A | Discharge status: Home / Self Care | Payer: PRIVATE HEALTH INSURANCE | Source: Ambulatory Visit | Attending: Cardiothoracic Surgery | Admitting: Cardiothoracic Surgery

## 2011-11-12 ENCOUNTER — Ambulatory Visit (HOSPITAL_COMMUNITY)
Admission: RE | Admit: 2011-11-12 | Discharge: 2011-11-12 | Disposition: A | Discharge status: Home / Self Care | Payer: PRIVATE HEALTH INSURANCE | Source: Ambulatory Visit | Attending: Radiology | Admitting: Radiology

## 2011-11-12 ENCOUNTER — Telehealth: Payer: Self-pay | Admitting: Internal Medicine

## 2011-11-12 ENCOUNTER — Ambulatory Visit (INDEPENDENT_AMBULATORY_CARE_PROVIDER_SITE_OTHER): Payer: Self-pay | Admitting: Cardiothoracic Surgery

## 2011-11-12 VITALS — BP 118/79 | HR 69 | Resp 20 | Ht 63.0 in | Wt 150.0 lb

## 2011-11-12 DIAGNOSIS — J90 Pleural effusion, not elsewhere classified: Secondary | ICD-10-CM

## 2011-11-12 DIAGNOSIS — C3410 Malignant neoplasm of upper lobe, unspecified bronchus or lung: Secondary | ICD-10-CM

## 2011-11-12 DIAGNOSIS — Z85118 Personal history of other malignant neoplasm of bronchus and lung: Secondary | ICD-10-CM | POA: Insufficient documentation

## 2011-11-12 DIAGNOSIS — Z902 Acquired absence of lung [part of]: Secondary | ICD-10-CM

## 2011-11-12 DIAGNOSIS — C3490 Malignant neoplasm of unspecified part of unspecified bronchus or lung: Secondary | ICD-10-CM

## 2011-11-12 DIAGNOSIS — Z9889 Other specified postprocedural states: Secondary | ICD-10-CM

## 2011-11-12 DIAGNOSIS — D381 Neoplasm of uncertain behavior of trachea, bronchus and lung: Secondary | ICD-10-CM

## 2011-11-12 NOTE — Procedures (Signed)
Successful US guided left thoracentesis. Yielded of cloudy pink fluid. Pt tolerated procedure well. No immediate complications.  Specimen was sent for labs. CXR ordered.  Brayton El PA-C 11/12/2011 2:41 PM

## 2011-11-12 NOTE — Progress Notes (Signed)
PCP is Oliver Barre, MD, MD Referring Provider is Corwin Levins, MD  Chief Complaint  Patient presents with  . 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 His had postthoracotomy pain relieved by the hydrocodone. He denies fever. The chest incision is well-healed.  Past Medical History  Diagnosis Date  . DIABETES MELLITUS, UNCONTROLLED 02/17/2009  . Diarrhea 02/17/2009  . HEMATOCHEZIA 02/17/2009  . HYPERLIPIDEMIA 02/17/2009  . LEG PAIN, BILATERAL 02/17/2009  . OSTEOARTHRITIS, KNEES, BILATERAL 05/14/2010  . Polyuria 03/24/2010  . Preventative health care 11/15/2010  . Allergic rhinitis, cause unspecified 02/26/2011  . Asthma 02/26/2011  . Shortness of breath   . Lung mass   . 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 Diabetes mellitus     ?  Marland Kitchen Hepatitis     hx    No past surgical history on file.  No family history on file.  Social History nonsmoker History  Substance Use Topics  . Smoking status: Never Smoker   . Smokeless tobacco: Not on file  . Alcohol Use: No     rare    Current Outpatient Prescriptions  Medication Sig Dispense Refill  . aspirin 81 MG EC tablet Take 81 mg by mouth daily.        Marland Kitchen atorvastatin (LIPITOR) 20 MG tablet Take 1 tablet (20 mg total) by mouth daily.  90 tablet  3  . HYDROcodone-acetaminophen (LORTAB 7.5) 7.5-500 MG per tablet Take 1 tablet by mouth every 6 (six) hours as needed for pain (may take one or two tablets every 4-6 hrs prn).  40 tablet  0  . HYDROMET 5-1.5 MG/5ML syrup Take 2.5 mLs by mouth every 6 (six) hours as needed.       . metoprolol tartrate (LOPRESSOR) 25 MG tablet Take 1 tablet (25 mg total) by mouth 2 (two) times daily.  60  tablet  1  . Multiple Vitamin (MULTIVITAMIN) capsule Take 1 capsule by mouth daily.      Marland Kitchen guaiFENesin (MUCINEX) 600 MG 12 hr tablet Take 2 tablets (1,200 mg total) by mouth 2 (two) times daily.  120 tablet  0    No Known Allergies  Review of Systems appetite and overall strength improving dry cough is a problem postthoracotomy pain treated with oral hydrocodone effectively  BP 118/79  Pulse 69  Resp 20  Ht 5\' 3"  (1.6 m)  Wt 150 lb (68.04 kg)  BMI 26.57 kg/m2  SpO2 98% Physical Exam Alert and responsive Thoracotomy incision well-healed Slight diminished breath sounds on the left compared to the right Heart rhythm regular No pedal edema  Diagnostic Tests: Chest x-ray shows mild elevation of left hemidiaphragm with a pleural effusion. Comparison of his early postop x-rays with current x-ray shows the effusion to be more significant and we will refer the patient for a ultrasound directed thoracentesis and culture the fluid. Followup in one week with chest x-ray   Impression: Postoperative pleural effusion with cough. Cough is nonproductive. Prescription for hydrocodone refill provided.   Plan: Thoracentesis and returned for x-ray next week June 12

## 2011-11-12 NOTE — Telephone Encounter (Signed)
Done per emr 

## 2011-11-16 LAB — BODY FLUID CULTURE

## 2011-11-17 ENCOUNTER — Other Ambulatory Visit: Payer: Self-pay | Admitting: Cardiothoracic Surgery

## 2011-11-17 ENCOUNTER — Ambulatory Visit (INDEPENDENT_AMBULATORY_CARE_PROVIDER_SITE_OTHER): Payer: Self-pay | Admitting: Cardiothoracic Surgery

## 2011-11-17 ENCOUNTER — Encounter: Payer: Self-pay | Admitting: Cardiothoracic Surgery

## 2011-11-17 ENCOUNTER — Ambulatory Visit
Admission: RE | Admit: 2011-11-17 | Discharge: 2011-11-17 | Disposition: A | Discharge status: Home / Self Care | Payer: PRIVATE HEALTH INSURANCE | Source: Ambulatory Visit | Attending: Cardiothoracic Surgery | Admitting: Cardiothoracic Surgery

## 2011-11-17 VITALS — BP 112/74 | HR 84 | Resp 18 | Ht 63.0 in | Wt 146.0 lb

## 2011-11-17 DIAGNOSIS — Z9889 Other specified postprocedural states: Secondary | ICD-10-CM

## 2011-11-17 DIAGNOSIS — D381 Neoplasm of uncertain behavior of trachea, bronchus and lung: Secondary | ICD-10-CM

## 2011-11-17 DIAGNOSIS — J90 Pleural effusion, not elsewhere classified: Secondary | ICD-10-CM

## 2011-11-17 DIAGNOSIS — Z902 Acquired absence of lung [part of]: Secondary | ICD-10-CM

## 2011-11-17 DIAGNOSIS — C3490 Malignant neoplasm of unspecified part of unspecified bronchus or lung: Secondary | ICD-10-CM

## 2011-11-17 NOTE — Progress Notes (Signed)
PCP is Oliver Barre, MD Referring Provider is Corwin Levins, MD  Chief Complaint  Patient presents with  . Routine Post Op    1 week f/u,  S/P Thoracentesis                      [address]            [place] [address]          [phone]      HPI: The patient returns for routine followup after undergoing left upper lobectomy for non-small cell carcinoma. He had a thoracentesis a week ago which returned 500 cc of serosanguineous, somewhat milky fluid by report. This was not sent for tests. The patient has a history of previous pouring T. be treated and had very large lymph nodes in the fissure which were pathology negative for tumor. He has a persistent dry cough. Incision is well-healed. Postthoracotomy pain is well controlled.   Past Medical History  Diagnosis Date  . DIABETES MELLITUS, UNCONTROLLED 02/17/2009  . Diarrhea 02/17/2009  . HEMATOCHEZIA 02/17/2009  . HYPERLIPIDEMIA 02/17/2009  . LEG PAIN, BILATERAL 02/17/2009  . OSTEOARTHRITIS, KNEES, BILATERAL 05/14/2010  . Polyuria 03/24/2010  . Preventative health care 11/15/2010  . Allergic rhinitis, cause unspecified 02/26/2011  . Asthma 02/26/2011  . Shortness of breath   . Lung mass   . 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 Diabetes mellitus     ?  Marland Kitchen Hepatitis     hx    No past surgical history on file.  No family history on file.  Social History History  Substance Use Topics  . Smoking status: Never Smoker   . Smokeless tobacco: Not on file  . Alcohol Use: No     rare    Current Outpatient Prescriptions  Medication Sig Dispense Refill  . aspirin 81 MG EC tablet Take 81 mg by mouth daily.        Marland Kitchen atorvastatin (LIPITOR) 20 MG tablet Take 1 tablet (20 mg total) by mouth daily.  90 tablet  3  . guaiFENesin (MUCINEX) 600 MG 12 hr tablet Take 2 tablets (1,200 mg total) by mouth 2 (two) times daily.  120 tablet  0  . metoprolol tartrate (LOPRESSOR) 25 MG tablet Take 1 tablet (25 mg total) by mouth 2 (two)  times daily.  60 tablet  1  . Multiple Vitamin (MULTIVITAMIN) capsule Take 1 capsule by mouth daily.      Marland Kitchen HYDROMET 5-1.5 MG/5ML syrup Take 2.5 mLs by mouth every 6 (six) hours as needed.         No Known Allergies  Review of Systems no fever no productive cough no hemoptysis getting stronger  BP 112/74  Pulse 84  Resp 18  Ht 5\' 3"  (1.6 m)  Wt 146 lb (66.225 kg)  BMI 25.86 kg/m2  SpO2 97% Physical Exam Alert and comfortable Breath sounds clear bilaterally No edema Diagnostic Tests: Chest x-ray today shows elevated left hemidiaphragm, stable no significant recurrent pleural effusion since thoracentesis  Impression: Repeat chest x-ray in 3 weeks, continue incentive  spirometer, do not return to work yet, prescription for hydrocodone provided for cough.  Plan: Return in 3 weeks with chest x-ray

## 2011-11-30 ENCOUNTER — Other Ambulatory Visit: Payer: Self-pay | Admitting: Cardiothoracic Surgery

## 2011-11-30 DIAGNOSIS — D381 Neoplasm of uncertain behavior of trachea, bronchus and lung: Secondary | ICD-10-CM

## 2011-12-08 ENCOUNTER — Encounter: Payer: Self-pay | Admitting: Cardiothoracic Surgery

## 2011-12-08 ENCOUNTER — Ambulatory Visit (INDEPENDENT_AMBULATORY_CARE_PROVIDER_SITE_OTHER): Payer: Self-pay | Admitting: Cardiothoracic Surgery

## 2011-12-08 ENCOUNTER — Ambulatory Visit
Admission: RE | Admit: 2011-12-08 | Discharge: 2011-12-08 | Disposition: A | Discharge status: Home / Self Care | Payer: PRIVATE HEALTH INSURANCE | Source: Ambulatory Visit | Attending: Cardiothoracic Surgery | Admitting: Cardiothoracic Surgery

## 2011-12-08 VITALS — BP 124/84 | HR 80 | Resp 18 | Ht 63.0 in | Wt 142.0 lb

## 2011-12-08 DIAGNOSIS — R05 Cough: Secondary | ICD-10-CM

## 2011-12-08 DIAGNOSIS — Z902 Acquired absence of lung [part of]: Secondary | ICD-10-CM

## 2011-12-08 DIAGNOSIS — J90 Pleural effusion, not elsewhere classified: Secondary | ICD-10-CM

## 2011-12-08 DIAGNOSIS — D381 Neoplasm of uncertain behavior of trachea, bronchus and lung: Secondary | ICD-10-CM

## 2011-12-08 DIAGNOSIS — R059 Cough, unspecified: Secondary | ICD-10-CM

## 2011-12-08 DIAGNOSIS — Z9889 Other specified postprocedural states: Secondary | ICD-10-CM

## 2011-12-08 DIAGNOSIS — C3490 Malignant neoplasm of unspecified part of unspecified bronchus or lung: Secondary | ICD-10-CM

## 2011-12-08 NOTE — Progress Notes (Signed)
PCP is Oliver Barre, MD Referring Provider is Clance, Maree Krabbe, MD  Chief Complaint  Patient presents with  . Routine Post Op    2 week f/u with CXR, S/P Lt upper lobectomy for non-small cell carcinoma    HPI: The patient is status post left upper lobectomy Oct 19 2011. His pathology was stage IA. Postop he developed a pleural effusion following discharge and required a thoracentesis. Fluid culture was negative and fluid cytology was negative. The patient continues to do well and his chest x-ray is now clear. However he has persistent cough. He denies symptoms reflux or using lots of mints. He coughs worse at night. He is been taking hydrocodone spell. 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 DIABETES MELLITUS, UNCONTROLLED 02/17/2009  . Diarrhea 02/17/2009  . HEMATOCHEZIA 02/17/2009  . HYPERLIPIDEMIA 02/17/2009  . LEG PAIN, BILATERAL 02/17/2009  . OSTEOARTHRITIS, KNEES, BILATERAL 05/14/2010  . Polyuria 03/24/2010  . Preventative health care 11/15/2010  . Allergic rhinitis, cause unspecified 02/26/2011  . Asthma 02/26/2011  . Shortness of breath   . Lung mass   . 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 Diabetes mellitus     ?  Marland Kitchen Hepatitis     hx    No past surgical history on file.  No family history on file.  Social History History  Substance Use Topics  . Smoking status: Never Smoker   . Smokeless tobacco: Not on file  . Alcohol Use: No     rare    Current Outpatient Prescriptions  Medication Sig Dispense Refill  . aspirin 81 MG EC tablet Take 81 mg by mouth daily.        Marland Kitchen atorvastatin (LIPITOR) 20 MG tablet Take 1 tablet (20 mg total) by mouth daily.  90 tablet  3  . guaiFENesin (MUCINEX) 600 MG 12 hr tablet Take 2 tablets (1,200 mg total) by mouth 2 (two) times daily.  120 tablet  0  . HYDROMET 5-1.5 MG/5ML syrup Take 2.5 mLs by mouth every 6 (six) hours as needed.        . metoprolol tartrate (LOPRESSOR) 25 MG tablet Take 1 tablet (25 mg total) by mouth 2 (two) times daily.  60 tablet  1  . Multiple Vitamin (MULTIVITAMIN) capsule Take 1 capsule by mouth daily.        No Known Allergies  Review of Systems no fever no weight loss improved energy  BP 124/84  Pulse 80  Resp 18  Ht 5\' 3"  (1.6 m)  Wt 142 lb (64.411 kg)  BMI 25.15 kg/m2  SpO2 97% Physical Exam Lungs clear Thoracotomy incision well-healed Cardiac rhythm regular   Diagnostic Tests: Chest x-ray shows resolution of the pleural effusion with good aeration of the remaining left lower lobe, appropriate elevation of left hemidiaphragm following lobectomy  Impression: Persistent cough following lobectomy. Suspect GERD and reflux and will give a course of Prilosec. Also a short course of oral steroids in case he has residual airway inflammation. He return in 3 months with chest x-ray. He will get an annual CT scan every year in May for surveillance following cancer resection  Plan:

## 2011-12-20 ENCOUNTER — Encounter: Payer: Self-pay | Admitting: Internal Medicine

## 2011-12-20 ENCOUNTER — Ambulatory Visit (INDEPENDENT_AMBULATORY_CARE_PROVIDER_SITE_OTHER): Payer: PRIVATE HEALTH INSURANCE | Admitting: Internal Medicine

## 2011-12-20 VITALS — BP 110/80 | HR 105 | Temp 98.3°F | Ht 63.0 in | Wt 142.2 lb

## 2011-12-20 DIAGNOSIS — R05 Cough: Secondary | ICD-10-CM

## 2011-12-20 DIAGNOSIS — Z0000 Encounter for general adult medical examination without abnormal findings: Secondary | ICD-10-CM

## 2011-12-20 DIAGNOSIS — E785 Hyperlipidemia, unspecified: Secondary | ICD-10-CM

## 2011-12-20 MED ORDER — DEXLANSOPRAZOLE 60 MG PO CPDR: 60.0000 mg | DELAYED_RELEASE_CAPSULE | Freq: Every day | ORAL | Status: DC

## 2011-12-20 MED ORDER — HYDROCOD POLST-CHLORPHEN POLST 10-8 MG/5ML PO LQCR: 5.0000 mL | Freq: Two times a day (BID) | ORAL | Status: DC | PRN

## 2011-12-20 NOTE — Assessment & Plan Note (Signed)
stable overall by hx and exam, most recent data reviewed with pt, and pt to continue medical treatment as before Lab Results  Component Value Date   LDLCALC 78 07/05/2011

## 2011-12-20 NOTE — Progress Notes (Signed)
Subjective:    Patient ID: Nicolas Cantu, male    DOB: 11-24-1965, 46 y.o.   MRN: [id number]  HPI  Here with ongoing cough even before the may 14 surgury for lung CA, even a bit worse recently with more freq cough, more severe.  Most recent cxr July 3 neg for acute. No sinus allergy symptoms, no overt reflux per pt on the omeprazole and still taking since July 3.  Cough med not helping, recent short steroid tx not helped.  Cough nonproductive.  Has hx of ? asthma, not used his inhaler recent, and in retrospect did not think advair helped.  No fever.  Pt denies chest pain, increased sob or doe, wheezing, orthopnea, PND, increased LE swelling, palpitations, dizziness or syncope.  Pt denies fever, wt loss, night sweats, loss of appetite, or other constitutional symptoms.  Does have increased upper abd discomfort recently, vague , mild, no n/v or radiation, not worse with food, no constipation  And Denies  dysphagia, n/v, bowel change or blood.    Past Medical History  Diagnosis Date  . DIABETES MELLITUS, UNCONTROLLED 02/17/2009  . Diarrhea 02/17/2009  . HEMATOCHEZIA 02/17/2009  . HYPERLIPIDEMIA 02/17/2009  . LEG PAIN, BILATERAL 02/17/2009  . OSTEOARTHRITIS, KNEES, BILATERAL 05/14/2010  . Polyuria 03/24/2010  . Preventative health care 11/15/2010  . Allergic rhinitis, cause unspecified 02/26/2011  . Asthma 02/26/2011  . Shortness of breath   . Lung mass   . 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 Diabetes mellitus     ?  Marland Kitchen Hepatitis     hx   No past surgical history on file.  reports that he has never smoked. He does not have any smokeless tobacco history on file. He reports that he does not drink alcohol or use illicit drugs. family history is not on file. No Known Allergies Current Outpatient Prescriptions on File Prior to Visit  Medication Sig Dispense Refill  . aspirin 81 MG EC tablet Take 81 mg by mouth daily.        Marland Kitchen atorvastatin (LIPITOR) 20 MG tablet Take 1 tablet (20 mg total) by mouth daily.   90 tablet  3  . guaiFENesin (MUCINEX) 600 MG 12 hr tablet Take 2 tablets (1,200 mg total) by mouth 2 (two) times daily.  120 tablet  0  . HYDROMET 5-1.5 MG/5ML syrup Take 2.5 mLs by mouth every 6 (six) hours as needed.       . metoprolol tartrate (LOPRESSOR) 25 MG tablet Take 1 tablet (25 mg total) by mouth 2 (two) times daily.  60 tablet  1  . Multiple Vitamin (MULTIVITAMIN) capsule Take 1 capsule by mouth daily.       Review of Systems Constitutional: Negative for diaphoresis and unexpected weight change.  HENT: Negative for drooling and tinnitus.   Eyes: Negative for photophobia and visual disturbance.  Respiratory: Negative for choking and stridor.   Gastrointestinal: Negative for vomiting and blood in stool.  Genitourinary: Negative for hematuria and decreased urine volume.  Musculoskeletal: Negative for gait problem.  Skin: Negative for color change and wound.  Neurological: Negative for tremors and numbness.  Psychiatric/Behavioral: Negative for decreased concentration. The patient is not hyperactive.      Objective:   Physical Exam BP 110/80  Pulse 105  Temp 98.3 F (36.8 C) (Oral)  Ht 5\' 3"  (1.6 m)  Wt 142 lb 4 oz (64.524 kg)  BMI 25.20 kg/m2  SpO2 97% Physical Exam  VS noted  Constitutional: Pt appears well-developed and well-nourished.  HENT: Head: Normocephalic.  Right Ear: External ear normal.  Left Ear: External ear normal.  Bilat tm's mild erythema - left > right  Sinus nontender.  Pharynx mild erythema Eyes: Conjunctivae and EOM are normal. Pupils are equal, round, and reactive to light.  Neck: Normal range of motion. Neck supple.  Cardiovascular: Normal rate and regular rhythm.   Pulmonary/Chest: Effort normal and breath sounds normal.  Abd:  Soft, NT, non-distended, + BS - benign exam Neurological: Pt is alert. Not confused Skin: Skin is warm. No erythema.  Psychiatric: Pt behavior is normal. Thought content normal.     Assessment & Plan:

## 2011-12-20 NOTE — Assessment & Plan Note (Signed)
stable overall by hx and exam, most recent data reviewed with pt, and pt to continue medical treatment as before Lab Results  Component Value Date   HGBA1C 6.1 05/21/2011

## 2011-12-20 NOTE — Patient Instructions (Addendum)
Take all new medications as prescribed - the Dexilant 60 mg per day OK to hold on taking the omeprazole Continue all other medications as before Please return in 3 mo with Lab testing done 3-5 days before

## 2011-12-20 NOTE — Assessment & Plan Note (Signed)
etiolgoy not clear, has some evidence for allergy on exam, and c/o some upper abd discomfort with benign exam, for now will hold on omeprazole, gave sample dexilant 60 qd, also tussionex limited rx

## 2011-12-29 ENCOUNTER — Other Ambulatory Visit: Payer: Self-pay

## 2011-12-29 DIAGNOSIS — G8918 Other acute postprocedural pain: Secondary | ICD-10-CM

## 2011-12-29 MED ORDER — HYDROCODONE-ACETAMINOPHEN 7.5-500 MG PO TABS
1.0000 | ORAL_TABLET | Freq: Four times a day (QID) | ORAL | Status: AC | PRN
Start: 2011-12-29 — End: 2012-01-08

## 2011-12-29 NOTE — Telephone Encounter (Signed)
RX for hydrocodone/ACE 7.5/500 mg 1 po every 6 hours prn pain #40/0 refills call to pt's pharm

## 2012-02-08 ENCOUNTER — Other Ambulatory Visit: Payer: Self-pay | Admitting: Cardiothoracic Surgery

## 2012-02-08 DIAGNOSIS — Z902 Acquired absence of lung [part of]: Secondary | ICD-10-CM

## 2012-02-09 ENCOUNTER — Ambulatory Visit: Payer: PRIVATE HEALTH INSURANCE | Admitting: Cardiothoracic Surgery

## 2012-02-21 ENCOUNTER — Other Ambulatory Visit: Payer: Self-pay | Admitting: Cardiothoracic Surgery

## 2012-02-21 DIAGNOSIS — Z902 Acquired absence of lung [part of]: Secondary | ICD-10-CM

## 2012-02-21 DIAGNOSIS — D381 Neoplasm of uncertain behavior of trachea, bronchus and lung: Secondary | ICD-10-CM

## 2012-02-23 ENCOUNTER — Ambulatory Visit
Admission: RE | Admit: 2012-02-23 | Discharge: 2012-02-23 | Disposition: A | Discharge status: Home / Self Care | Payer: PRIVATE HEALTH INSURANCE | Source: Ambulatory Visit | Attending: Cardiothoracic Surgery | Admitting: Cardiothoracic Surgery

## 2012-02-23 ENCOUNTER — Ambulatory Visit (INDEPENDENT_AMBULATORY_CARE_PROVIDER_SITE_OTHER): Payer: PRIVATE HEALTH INSURANCE | Admitting: Cardiothoracic Surgery

## 2012-02-23 ENCOUNTER — Encounter: Payer: Self-pay | Admitting: Cardiothoracic Surgery

## 2012-02-23 VITALS — BP 113/75 | HR 79 | Resp 16 | Ht 63.0 in | Wt 146.0 lb

## 2012-02-23 DIAGNOSIS — Z9889 Other specified postprocedural states: Secondary | ICD-10-CM

## 2012-02-23 DIAGNOSIS — D381 Neoplasm of uncertain behavior of trachea, bronchus and lung: Secondary | ICD-10-CM

## 2012-02-23 DIAGNOSIS — Z902 Acquired absence of lung [part of]: Secondary | ICD-10-CM

## 2012-02-23 DIAGNOSIS — C3490 Malignant neoplasm of unspecified part of unspecified bronchus or lung: Secondary | ICD-10-CM

## 2012-02-23 NOTE — Progress Notes (Signed)
PCP is Oliver Barre, MD Referring Provider is Corwin Levins, MD  Chief Complaint  Patient presents with  . Follow-up    8week  FU/  CXR    HPI: 6 months status post left upper lobectomy for adenocarcinoma stage I with past history of pulmonary tuberculosis His postoperative problem of a nonproductive cough is significantly improved. His back to work as a Psychologist, occupational. He has typical postthoracotomy pain but has not required narcotic.   Past Medical History  Diagnosis Date  . DIABETES MELLITUS, UNCONTROLLED 02/17/2009  . Diarrhea 02/17/2009  . HEMATOCHEZIA 02/17/2009  . HYPERLIPIDEMIA 02/17/2009  . LEG PAIN, BILATERAL 02/17/2009  . OSTEOARTHRITIS, KNEES, BILATERAL 05/14/2010  . Polyuria 03/24/2010  . Preventative health care 11/15/2010  . Allergic rhinitis, cause unspecified 02/26/2011  . Asthma 02/26/2011  . Shortness of breath   . Lung mass   . 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 Diabetes mellitus     ?  Marland Kitchen Hepatitis     hx    No past surgical history on file.  No family history on file.  Social History History  Substance Use Topics  . Smoking status: Never Smoker   . Smokeless tobacco: Not on file  . Alcohol Use: No     rare    Current Outpatient Prescriptions  Medication Sig Dispense Refill  . aspirin 81 MG EC tablet Take 81 mg by mouth daily.        Marland Kitchen atorvastatin (LIPITOR) 20 MG tablet Take 1 tablet (20 mg total) by mouth daily.  90 tablet  3  . Multiple Vitamin (MULTIVITAMIN) capsule Take 1 capsule by mouth daily.      . chlorpheniramine-HYDROcodone (TUSSIONEX PENNKINETIC ER) 10-8 MG/5ML LQCR Take 5 mLs by mouth every 12 (twelve) hours as needed.  140 mL  1  . dexlansoprazole (DEXILANT) 60 MG capsule Take 1 capsule (60 mg total) by mouth daily.  30 capsule  11  . guaiFENesin (MUCINEX) 600 MG 12 hr tablet Take 2 tablets (1,200 mg total) by mouth 2 (two) times daily.  120 tablet  0  . HYDROMET 5-1.5 MG/5ML syrup Take 2.5 mLs by mouth every 6 (six) hours as needed.       .  metoprolol tartrate (LOPRESSOR) 25 MG tablet Take 1 tablet (25 mg total) by mouth 2 (two) times daily.  60 tablet  1    No Known Allergies  Review of Systems no fever no shortness of breath improved cough  BP 113/75  Pulse 79  Resp 16  Ht 5\' 3"  (1.6 m)  Wt 146 lb (66.225 kg)  BMI 25.86 kg/m2  SpO2 98% Physical Exam  general alert and comfortable   lungs clear bilaterally   well-healed left mini thoracotomy incision Cardiac rhythm regular   Diagnostic Tests:  chest x-ray without active disease, mild elevation of left hemidiaphragm status post lobectomy no effusion   Impression:  no evidence recurrent cancer 6 months post left upper lobectomy -adenocarcinoma of lung positive E. GFR mutation negative for ALK mutation   lReturn in one year postop for CT scan of chest

## 2012-03-16 ENCOUNTER — Other Ambulatory Visit (INDEPENDENT_AMBULATORY_CARE_PROVIDER_SITE_OTHER): Payer: PRIVATE HEALTH INSURANCE

## 2012-03-16 DIAGNOSIS — E785 Hyperlipidemia, unspecified: Secondary | ICD-10-CM

## 2012-03-16 DIAGNOSIS — IMO0001 Reserved for inherently not codable concepts without codable children: Secondary | ICD-10-CM

## 2012-03-16 DIAGNOSIS — Z0000 Encounter for general adult medical examination without abnormal findings: Secondary | ICD-10-CM

## 2012-03-16 LAB — MICROALBUMIN / CREATININE URINE RATIO
Microalb Creat Ratio: 0.5 mg/g (ref 0.0–30.0)
Microalb, Ur: 0.5 mg/dL (ref 0.0–1.9)

## 2012-03-16 LAB — CBC WITH DIFFERENTIAL/PLATELET
Basophils Relative: 0.2 % (ref 0.0–3.0)
Eosinophils Relative: 1.9 % (ref 0.0–5.0)
Hemoglobin: 14.1 g/dL (ref 13.0–17.0)
Lymphocytes Relative: 34.5 % (ref 12.0–46.0)
Monocytes Relative: 6.1 % (ref 3.0–12.0)
Neutro Abs: 3.2 10*3/uL (ref 1.4–7.7)
RBC: 4.9 Mil/uL (ref 4.22–5.81)

## 2012-03-16 LAB — LIPID PANEL
Cholesterol: 268 mg/dL — ABNORMAL HIGH (ref 0–200)
Triglycerides: 532 mg/dL — ABNORMAL HIGH (ref 0.0–149.0)

## 2012-03-16 LAB — HEPATIC FUNCTION PANEL
AST: 26 U/L (ref 0–37)
Albumin: 4.1 g/dL (ref 3.5–5.2)
Alkaline Phosphatase: 84 U/L (ref 39–117)
Total Protein: 7.5 g/dL (ref 6.0–8.3)

## 2012-03-16 LAB — TSH: TSH: 0.84 u[IU]/mL (ref 0.35–5.50)

## 2012-03-16 LAB — BASIC METABOLIC PANEL
CO2: 31 mEq/L (ref 19–32)
Calcium: 9.8 mg/dL (ref 8.4–10.5)
Creatinine, Ser: 1 mg/dL (ref 0.4–1.5)

## 2012-03-16 LAB — URINALYSIS, ROUTINE W REFLEX MICROSCOPIC
Hgb urine dipstick: NEGATIVE
Specific Gravity, Urine: 1.02 (ref 1.000–1.030)
Total Protein, Urine: NEGATIVE
Urine Glucose: NEGATIVE
WBC, UA: NONE SEEN (ref 0–?)

## 2012-03-16 LAB — HEMOGLOBIN A1C: Hgb A1c MFr Bld: 6.1 % (ref 4.6–6.5)

## 2012-03-16 LAB — LDL CHOLESTEROL, DIRECT: Direct LDL: 131.7 mg/dL

## 2012-03-17 ENCOUNTER — Ambulatory Visit (INDEPENDENT_AMBULATORY_CARE_PROVIDER_SITE_OTHER): Payer: PRIVATE HEALTH INSURANCE | Admitting: Internal Medicine

## 2012-03-17 ENCOUNTER — Encounter: Payer: Self-pay | Admitting: Internal Medicine

## 2012-03-17 VITALS — BP 100/60 | HR 95 | Temp 97.1°F | Ht 63.0 in | Wt 143.4 lb

## 2012-03-17 DIAGNOSIS — C3490 Malignant neoplasm of unspecified part of unspecified bronchus or lung: Secondary | ICD-10-CM

## 2012-03-17 DIAGNOSIS — Z23 Encounter for immunization: Secondary | ICD-10-CM

## 2012-03-17 DIAGNOSIS — M549 Dorsalgia, unspecified: Secondary | ICD-10-CM

## 2012-03-17 DIAGNOSIS — K921 Melena: Secondary | ICD-10-CM | POA: Insufficient documentation

## 2012-03-17 DIAGNOSIS — Z0000 Encounter for general adult medical examination without abnormal findings: Secondary | ICD-10-CM

## 2012-03-17 MED ORDER — NAPROXEN 500 MG PO TABS: 500.0000 mg | ORAL_TABLET | Freq: Two times a day (BID) | ORAL | Status: DC

## 2012-03-17 MED ORDER — CYCLOBENZAPRINE HCL 5 MG PO TABS: 5.0000 mg | ORAL_TABLET | Freq: Three times a day (TID) | ORAL | Status: DC | PRN

## 2012-03-17 MED ORDER — ATORVASTATIN CALCIUM 20 MG PO TABS: 20.0000 mg | ORAL_TABLET | Freq: Every day | ORAL | Status: DC

## 2012-03-17 NOTE — Assessment & Plan Note (Signed)

## 2012-03-17 NOTE — Patient Instructions (Addendum)
You had the flu shot today Take all new medications as prescribed  - the antiinflammatory, and muscle relaxer as needed for pain Continue all other medications as before; your lipitor was refilled to the pharmacy You will be contacted regarding the referral for: colonoscopy You are otherwise up to date with prevention Please continue your efforts at being more active, low cholesterol diet, and weight control. Please keep your appointments with your specialists as you have planned Please return in 1 year for your yearly visit, or sooner if needed, with Lab testing done 3-5 days before

## 2012-03-17 NOTE — Progress Notes (Signed)
Subjective:    Patient ID: Nicolas Cantu, male    DOB: 10/07/1965, 46 y.o.   MRN: [id number]  HPI  Here for wellness and f/u;  Overall doing ok;  Pt denies CP, worsening SOB, DOE, wheezing, orthopnea, PND, worsening LE edema, palpitations, dizziness or syncope.  Pt denies neurological change such as new Headache, facial or extremity weakness.  Pt denies polydipsia, polyuria, or low sugar symptoms. Pt states overall good compliance with treatment and medications, good tolerability, and trying to follow lower cholesterol diet.  Pt denies worsening depressive symptoms, suicidal ideation or panic. No fever, wt loss, night sweats, loss of appetite, or other constitutional symptoms.  Pt states good ability with ADL's, low fall risk, home safety reviewed and adequate, no significant changes in hearing or vision, and occasionally active with exercise.  Still with rather diffuse postsurgical area pain, more diffuse chest and upper back, as well as loewr back as well.  No current pain. Has been more exercise with 13 lbs wt loss intentional. Has been out of the lipitor for several wks, wants to re-start.  No evidecne so far of lung ca recurrence, did not require CMT or XRT.  Came to [place] from [place] 1982;  Actual birth year is [birth year], pt changed by 3 yrs on purpose to be admitted to [place] and go to HS here.   Also with recent several episodes painless small volume BRBPR in the past month.  For flu shot today Past Medical History  Diagnosis Date  . DIABETES MELLITUS, UNCONTROLLED 02/17/2009  . Diarrhea 02/17/2009  . HEMATOCHEZIA 02/17/2009  . HYPERLIPIDEMIA 02/17/2009  . LEG PAIN, BILATERAL 02/17/2009  . OSTEOARTHRITIS, KNEES, BILATERAL 05/14/2010  . Polyuria 03/24/2010  . Preventative health care 11/15/2010  . Allergic rhinitis, cause unspecified 02/26/2011  . Asthma 02/26/2011  . Shortness of breath   . Lung mass   . 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 Diabetes mellitus     ?  Marland Kitchen Hepatitis     hx  . Lung cancer 2013    [facility]   No past surgical history on file.  reports that he has never smoked. He does not have any smokeless tobacco history on file. He reports that he does not drink alcohol or use illicit drugs. family history is not on file. No Known Allergies Current Outpatient Prescriptions on File Prior to Visit  Medication Sig Dispense Refill  . aspirin 81 MG EC tablet Take 81 mg by mouth daily.        Marland Kitchen atorvastatin (LIPITOR) 20 MG tablet Take 1 tablet (20 mg total) by mouth daily.  90 tablet  3  . dexlansoprazole (DEXILANT) 60 MG capsule Take 1 capsule (60 mg total) by mouth daily.  30 capsule  11   Review of Systems Review of Systems  Constitutional: Negative for diaphoresis, activity change, appetite change and unexpected weight change.  HENT: Negative for hearing loss, ear pain, facial swelling, mouth sores and neck stiffness.   Eyes: Negative for pain, redness and visual disturbance.  Respiratory: Negative for shortness of breath and wheezing.   Cardiovascular: Negative for chest pain and palpitations.  Gastrointestinal: Negative for diarrhea, blood in stool, abdominal distention and rectal pain.  Genitourinary: Negative for hematuria, flank pain and decreased urine volume.  Musculoskeletal: Negative for myalgias and joint swelling.  Skin: Negative for color change and wound.  Neurological: Negative for syncope and numbness.  Hematological: Negative for adenopathy.  Psychiatric/Behavioral: Negative for hallucinations, self-injury,  decreased concentration and agitation.      Objective:   Physical Exam BP 100/60  Pulse 95  Temp 97.1 F (36.2 C) (Oral)  Ht 5\' 3"  (1.6 m)  Wt 143 lb 6 oz (65.034 kg)  BMI 25.40 kg/m2  SpO2 97% Physical Exam  VS noted Constitutional: Pt is oriented to person, place, and time. Appears well-developed and well-nourished.  HENT:  Head: Normocephalic and atraumatic.  Right Ear: External ear normal.  Left Ear: External ear normal.  Nose: Nose normal.    Mouth/Throat: Oropharynx is clear and moist.  Eyes: Conjunctivae and EOM are normal. Pupils are equal, round, and reactive to light.  Neck: Normal range of motion. Neck supple. No JVD present. No tracheal deviation present.  Cardiovascular: Normal rate, regular rhythm, normal heart sounds and intact distal pulses.   Pulmonary/Chest: Effort normal and breath sounds normal.  Abdominal: Soft. Bowel sounds are normal. There is no tenderness.  Musculoskeletal: Normal range of motion. Exhibits no edema.  Lymphadenopathy:  Has no cervical adenopathy.  Neurological: Pt is alert and oriented to person, place, and time. Pt has normal reflexes. No cranial nerve deficit.  Skin: Skin is warm and dry. No rash noted.  Psychiatric:  Has  normal mood and affect. Behavior is normal.  Has diffuse tender bilat trapezoid area and right lumbar paravertebral    Assessment & Plan:

## 2012-03-18 ENCOUNTER — Encounter: Payer: Self-pay | Admitting: Internal Medicine

## 2012-03-18 NOTE — Assessment & Plan Note (Signed)
For naproxen/flexeril for MSK pain

## 2012-03-18 NOTE — Assessment & Plan Note (Signed)
For colonoscopy 

## 2012-03-18 NOTE — Assessment & Plan Note (Signed)
stable overall by hx and exam, most recent data reviewed with pt, and pt to continue medical treatment as before Lab Results  Component Value Date   HGBA1C 6.1 03/16/2012

## 2012-04-10 ENCOUNTER — Telehealth: Payer: Self-pay

## 2012-04-10 NOTE — Telephone Encounter (Signed)
Flexeril and nsaid were sent to the pharmacy that date  Referral to GI was made, and noted by [facility] that [facility] would contact pt  Robin to notify pt, and direct pt to [facility] if needed  Ok to re-send meds if needed

## 2012-04-10 NOTE — Telephone Encounter (Signed)
The patient never heard back on scheduling for his colonoscopy due to blood in his stool.  Also informed that he did not get prescription for his chest pain from last office visit.  Please advise

## 2012-04-11 NOTE — Telephone Encounter (Signed)
Called the patient left detailed message of MD instructions.  Informed [facility] of patients call.  They informed that referral had been sent to GI and they have not yet followed up with the patient.  [facility] will contact GI today to schedule appt.

## 2012-06-26 ENCOUNTER — Telehealth: Payer: Self-pay

## 2012-06-26 ENCOUNTER — Ambulatory Visit (INDEPENDENT_AMBULATORY_CARE_PROVIDER_SITE_OTHER): Payer: PRIVATE HEALTH INSURANCE | Admitting: Internal Medicine

## 2012-06-26 ENCOUNTER — Encounter: Payer: Self-pay | Admitting: Internal Medicine

## 2012-06-26 VITALS — BP 120/82 | HR 97 | Temp 98.1°F | Ht 63.0 in | Wt 142.1 lb

## 2012-06-26 DIAGNOSIS — R109 Unspecified abdominal pain: Secondary | ICD-10-CM

## 2012-06-26 DIAGNOSIS — K921 Melena: Secondary | ICD-10-CM

## 2012-06-26 MED ORDER — TRAMADOL HCL 50 MG PO TABS: 50.0000 mg | ORAL_TABLET | Freq: Three times a day (TID) | ORAL | Status: DC | PRN

## 2012-06-26 NOTE — Telephone Encounter (Signed)
Was ordered oct 2013, but not sure what happened  Referral re-done

## 2012-06-26 NOTE — Telephone Encounter (Signed)
Patient was told by MD he would be getting a GI referral and has not hear anything about this apponitment.  Please contact the patient [phone]

## 2012-06-27 ENCOUNTER — Encounter: Payer: Self-pay | Admitting: Internal Medicine

## 2012-06-27 NOTE — Patient Instructions (Addendum)
Abdominal Pain (Nonspecific)  Your exam might not show the exact reason you have abdominal pain. Since there are many different causes of abdominal pain, another checkup and more tests may be needed. It is very important to follow up for lasting (persistent) or worsening symptoms. A possible cause of abdominal pain in any person who still has his or her appendix is acute appendicitis. Appendicitis is often hard to diagnose. Normal blood tests, urine tests, ultrasound, and CT scans do not completely rule out early appendicitis or other causes of abdominal pain. Sometimes, only the changes that happen over time will allow appendicitis and other causes of abdominal pain to be determined. Other potential problems that may require surgery may also take time to become more apparent. Because of this, it is important that you follow all of the instructions below.  HOME CARE INSTRUCTIONS    Rest as much as possible.   Do not eat solid food until your pain is gone.   While adults or children have pain: A diet of water, weak decaffeinated tea, broth or bouillon, gelatin, oral rehydration solutions (ORS), frozen ice pops, or ice chips may be helpful.   When pain is gone in adults or children: Start a light diet (dry toast, crackers, applesauce, or white rice). Increase the diet slowly as long as it does not bother you. Eat no dairy products (including cheese and eggs) and no spicy, fatty, fried, or high-fiber foods.   Use no alcohol, caffeine, or cigarettes.   Take your regular medicines unless your caregiver told you not to.   Take any prescribed medicine as directed.   Only take over-the-counter or prescription medicines for pain, discomfort, or fever as directed by your caregiver. Do not give aspirin to children.  If your caregiver has given you a follow-up appointment, it is very important to keep that appointment. Not keeping the appointment could result in a permanent injury and/or lasting (chronic) pain and/or  disability. If there is any problem keeping the appointment, you must call to reschedule.   SEEK IMMEDIATE MEDICAL CARE IF:    Your pain is not gone in 24 hours.   Your pain becomes worse, changes location, or feels different.   You or your child has an oral temperature above 102 F (38.9 C), not controlled by medicine.   Your baby is older than 3 months with a rectal temperature of 102 F (38.9 C) or higher.   Your baby is 3 months old or younger with a rectal temperature of 100.4 F (38 C) or higher.   You have shaking chills.   You keep throwing up (vomiting) or cannot drink liquids.   There is blood in your vomit or you see blood in your bowel movements.   Your bowel movements become dark or black.   You have frequent bowel movements.   Your bowel movements stop (become blocked) or you cannot pass gas.   You have bloody, frequent, or painful urination.   You have yellow discoloration in the skin or whites of the eyes.   Your stomach becomes bloated or bigger.   You have dizziness or fainting.   You have chest or back pain.  MAKE SURE YOU:    Understand these instructions.   Will watch your condition.   Will get help right away if you are not doing well or get worse.  Document Released: 05/24/2005 Document Revised: 08/16/2011 Document Reviewed: 04/21/2009  Exit[employer] Patient Information 2013 [employer].

## 2012-06-27 NOTE — Progress Notes (Signed)
Subjective:    Patient ID: Nicolas Cantu, male    DOB: 1965/09/07, 47 y.o.   MRN: [id number]  HPI  Pt presents to the clinic today with c/o of generalized abdominal pain x 3 days. The pain is constant. It is not affected by his eating. He denies any injury to the area. He denies nausea or vomiting. He has had mild diarrhea but denies blood in his stool. He has taken pepto bismol without relief. He has never had pain like this in the past. He denies fever, chills or body aches. He has not had sick contacts.  Review of Systems  Past Medical History  Diagnosis Date  . DIABETES MELLITUS, UNCONTROLLED 02/17/2009  . Diarrhea 02/17/2009  . HEMATOCHEZIA 02/17/2009  . HYPERLIPIDEMIA 02/17/2009  . LEG PAIN, BILATERAL 02/17/2009  . OSTEOARTHRITIS, KNEES, BILATERAL 05/14/2010  . Polyuria 03/24/2010  . Preventative health care 11/15/2010  . Allergic rhinitis, cause unspecified 02/26/2011  . Asthma 02/26/2011  . Shortness of breath   . Lung mass   . 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 Diabetes mellitus     ?  Marland Kitchen Hepatitis     hx  . Lung cancer 2013    [facility]    Current Outpatient Prescriptions  Medication Sig Dispense Refill  . aspirin 81 MG EC tablet Take 81 mg by mouth daily.        Marland Kitchen atorvastatin (LIPITOR) 20 MG tablet Take 1 tablet (20 mg total) by mouth daily.  90 tablet  3  . naproxen (NAPROSYN) 500 MG tablet Take 1 tablet (500 mg total) by mouth 2 (two) times daily with a meal.  60 tablet  2  . cyclobenzaprine (FLEXERIL) 5 MG tablet Take 1 tablet (5 mg total) by mouth 3 (three) times daily as needed for muscle spasms.  60 tablet  1  . dexlansoprazole (DEXILANT) 60 MG capsule Take 1 capsule (60 mg total) by mouth daily.  30 capsule  11  . traMADol (ULTRAM) 50 MG tablet Take 1 tablet (50 mg total) by mouth every 8 (eight) hours as needed for pain.  30 tablet  0    No Known Allergies  History reviewed. No pertinent family history.  History   Social History  . Marital Status: Married   Spouse Name: N/A    Number of Children: 4  . Years of Education: N/A   Occupational History  . MACHINE OPERATOR    Social History Main Topics  . Smoking status: Never Smoker   . Smokeless tobacco: Not on file  . Alcohol Use: No     Comment: rare  . Drug Use: No  . Sexually Active: Not on file   Other Topics Concern  . Not on file   Social History Narrative   Family history unknown as parents killed by [facility] in [place] when he was 47 yo.     Constitutional: Denies fever, malaise, fatigue, headache or abrupt weight changes.   Cardiovascular: Denies chest pain, chest tightness, palpitations or swelling in the hands or feet.  Gastrointestinal: Pt reports abdominal pain and diarrhea. Denies bloating, constipation, or blood in the stool.  GU: Denies urgency, frequency, pain with urination, burning sensation, blood in urine, odor or discharge. Musculoskeletal: Denies decrease in range of motion, difficulty with gait, muscle pain or joint pain and swelling.   No other specific complaints in a complete review of systems (except as listed in HPI above).     Objective:  Physical Exam  BP 120/82  Pulse 97  Temp 98.1 F (36.7 C) (Oral)  Ht 5\' 3"  (1.6 m)  Wt 142 lb 2 oz (64.467 kg)  BMI 25.18 kg/m2  SpO2 97% Wt Readings from Last 3 Encounters:  06/26/12 142 lb 2 oz (64.467 kg)  03/17/12 143 lb 6 oz (65.034 kg)  02/23/12 146 lb (66.225 kg)    General: Appears his stated age, well developed, well nourished in NAD. Cardiovascular: Normal rate and rhythm. S1,S2 noted.  No murmur, rubs or gallops noted. No JVD or BLE edema. No carotid bruits noted. Pulmonary/Chest: Normal effort and positive vesicular breath sounds. No respiratory distress. No wheezes, rales or ronchi noted.  Abdomen: Soft. Tender to deep palpation of the RLQ. Normal bowel sounds, no bruits noted. No distention or masses noted. Liver, spleen and kidneys non palpable. Musculoskeletal: Normal range of motion.  No signs of joint swelling. No difficulty with gait.       Assessment & Plan:   Abdominal pain, unspecified, new onset with additional workup required:  eRx for Tramadol If pain continues, will get CT scan of abd to r/o appendicitis  RTC as needed or if symptoms persist

## 2012-07-24 ENCOUNTER — Encounter: Payer: Self-pay | Admitting: Internal Medicine

## 2012-07-25 ENCOUNTER — Encounter: Payer: Self-pay | Admitting: Internal Medicine

## 2012-07-25 ENCOUNTER — Ambulatory Visit (INDEPENDENT_AMBULATORY_CARE_PROVIDER_SITE_OTHER): Payer: PRIVATE HEALTH INSURANCE | Admitting: Internal Medicine

## 2012-07-25 ENCOUNTER — Other Ambulatory Visit (INDEPENDENT_AMBULATORY_CARE_PROVIDER_SITE_OTHER): Payer: PRIVATE HEALTH INSURANCE

## 2012-07-25 VITALS — BP 110/70 | HR 90 | Ht 63.0 in | Wt 147.0 lb

## 2012-07-25 DIAGNOSIS — Z1211 Encounter for screening for malignant neoplasm of colon: Secondary | ICD-10-CM

## 2012-07-25 DIAGNOSIS — K625 Hemorrhage of anus and rectum: Secondary | ICD-10-CM

## 2012-07-25 DIAGNOSIS — R195 Other fecal abnormalities: Secondary | ICD-10-CM

## 2012-07-25 LAB — CBC
HCT: 40.8 % (ref 39.0–52.0)
MCV: 87.4 fl (ref 78.0–100.0)
Platelets: 237 10*3/uL (ref 150.0–400.0)
RBC: 4.67 Mil/uL (ref 4.22–5.81)
WBC: 5.7 10*3/uL (ref 4.5–10.5)

## 2012-07-25 LAB — COMPREHENSIVE METABOLIC PANEL
Albumin: 4.3 g/dL (ref 3.5–5.2)
Alkaline Phosphatase: 79 U/L (ref 39–117)
BUN: 17 mg/dL (ref 6–23)
CO2: 30 mEq/L (ref 19–32)
GFR: 87.72 mL/min (ref >60.00)
Glucose, Bld: 100 mg/dL — ABNORMAL HIGH (ref 70–99)
Potassium: 4.4 mEq/L (ref 3.5–5.1)
Sodium: 140 mEq/L (ref 135–145)
Total Protein: 7.7 g/dL (ref 6.0–8.3)

## 2012-07-25 LAB — LIPASE: Lipase: 27 U/L (ref 11.0–59.0)

## 2012-07-25 MED ORDER — PEG-KCL-NACL-NASULF-NA ASC-C 100 G PO SOLR: 1.0000 | Freq: Once | ORAL | Status: DC

## 2012-07-25 NOTE — Progress Notes (Signed)
Patient ID: Nicolas Cantu, male   DOB: 1965/10/06, 47 y.o.   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 Jonny Ruiz for evaluation of diarrhea/loose stools with intermittent rectal bleeding.  The patient reports lower abdominal cramping pain and associated borborygmi. He also reports over the last several years a change in bowel habits, specifically with diarrhea occurring 2-3 times per day. Occasionally he does see red blood in his stools. Normal bowel habits for him is one formed stool daily. He denies fever or chills. No heartburn. No nausea or vomiting. Reports his appetite has been good and his weight has been stable. He's never had a colonoscopy. He does feel that his bowel habits changed after taking a trip to [place] in around 2011.  Family history is for the most part unknown, and he reports his parents were killed in [place] when he was 11  Review of Systems  As per history of present illness, otherwise negative   Past Medical History  Diagnosis Date  . DIABETES MELLITUS, UNCONTROLLED 02/17/2009  . Diarrhea 02/17/2009  . HEMATOCHEZIA 02/17/2009  . HYPERLIPIDEMIA 02/17/2009  . LEG PAIN, BILATERAL 02/17/2009  . OSTEOARTHRITIS, KNEES, BILATERAL 05/14/2010  . Polyuria 03/24/2010  . Preventative health care 11/15/2010  . Allergic rhinitis, cause unspecified 02/26/2011  . Asthma 02/26/2011  . Shortness of breath   . Lung mass   . 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 Diabetes mellitus     ?  Marland Kitchen Hepatitis     hx  . Lung cancer 2013    L[facility]    Current Outpatient Prescriptions  Medication Sig Dispense Refill  . aspirin 81 MG EC  tablet Take 81 mg by mouth daily.        Marland Kitchen atorvastatin (LIPITOR) 20 MG tablet Take 1 tablet (20 mg total) by mouth daily.  90 tablet  3  . cyclobenzaprine (FLEXERIL) 5 MG tablet Take 1 tablet (5 mg total) by mouth 3 (three) times daily as needed for muscle spasms.  60 tablet  1  . dexlansoprazole (DEXILANT) 60 MG capsule Take 1 capsule (60 mg total) by mouth daily.  30 capsule  11  . peg 3350 powder (MOVIPREP) 100 G SOLR Take 1 kit (100 g total) by mouth once.  1 kit  0  . traMADol (ULTRAM) 50 MG tablet Take 1 tablet (50 mg total) by mouth every 8 (eight) hours as needed for pain.  30 tablet  0   No current facility-administered medications for this visit.    No Known Allergies  Family History  Problem Relation Age of Onset  . Family history unknown: Yes    History  Substance Use Topics  . Smoking status: Never Smoker   . Smokeless tobacco: Never Used  . Alcohol Use: No     Comment: rare    OBJECTIVE: BP 110/70  Pulse 90  Ht 5\' 3"  (1.6 m)  Wt 147 lb (66.679 kg)  BMI 26.05 kg/m2  SpO2 98% Constitutional: Well-developed and well-nourished. No distress. HEENT: Normocephalic and atraumatic. Oropharynx is clear and moist. No oropharyngeal exudate. Conjunctivae are normal. No  scleral icterus. Neck: Neck supple. Trachea midline. Cardiovascular: Normal rate, regular rhythm and intact distal pulses. No M/R/G Pulmonary/chest: Effort normal and breath sounds normal. No wheezing, rales or rhonchi. Abdominal: Soft, nontender, nondistended. Bowel sounds active throughout. There are no masses palpable. No hepatosplenomegaly. Extremities: no clubbing, cyanosis, or edema Lymphadenopathy: No cervical adenopathy noted. Neurological: Alert and oriented to person place and time. Skin: Skin is warm and dry. No rashes noted. Psychiatric: Normal mood and affect. Behavior is normal.  Labs and Imaging -- CBC    Component Value Date/Time   WBC 5.7 07/25/2012 1645   RBC 4.67 07/25/2012 1645    HGB 13.7 07/25/2012 1645   HCT 40.8 07/25/2012 1645   PLT 237.0 07/25/2012 1645   MCV 87.4 07/25/2012 1645   MCH 29.0 10/23/2011 0500   MCHC 33.4 07/25/2012 1645   RDW 12.3 07/25/2012 1645   LYMPHSABS 1.9 03/16/2012 1552   MONOABS 0.3 03/16/2012 1552   EOSABS 0.1 03/16/2012 1552   BASOSABS 0.0 03/16/2012 1552    CMP     Component Value Date/Time   NA 140 07/25/2012 1645   K 4.4 07/25/2012 1645   CL 103 07/25/2012 1645   CO2 30 07/25/2012 1645   GLUCOSE 100* 07/25/2012 1645   BUN 17 07/25/2012 1645   CREATININE 1.0 07/25/2012 1645   CREATININE 0.98 10/08/2011 1625   CALCIUM 9.4 07/25/2012 1645   PROT 7.7 07/25/2012 1645   ALBUMIN 4.3 07/25/2012 1645   AST 22 07/25/2012 1645   ALT 27 07/25/2012 1645   ALKPHOS 79 07/25/2012 1645   BILITOT 0.5 07/25/2012 1645   GFRNONAA >90 10/23/2011 0500   GFRAA >90 10/23/2011 0500   Lipase     Component Value Date/Time   LIPASE 27.0 07/25/2012 1645      PET scan from October 2012 reviewed which did show his left upper lobe lung mass but no other evidence of malignancy or metastatic disease  ASSESSMENT AND PLAN: 47 yo (patient states actual age is 43, but notes his age and is decreased when he came to the [place] from [place] so he could qualify for school) with past medical history of diabetes, hyperlipidemia, asthma, history of remote tuberculosis treated, and 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 Jonny Ruiz for evaluation of diarrhea/loose stools with intermittent rectal bleeding.   1.  Loose stools/intermittent rectal bleeding -- the patient describes loose stools or diarrhea over last several years, but the intermittent rectal bleeding is new. I recommended colonoscopy for direct visualization. He is also 47 years old and colonoscopy is appropriate for colorectal cancer screening as well. I would like to perform stool studies to ensure there is no infectious etiology, particularly ova and  parasite exam given his travel to [place] in association with worsening symptoms after traveling. We will perform other stool studies for completeness. CBC, lipase, and hepatic function panel normal today and reassuring. Celiac panel also performed for completeness. Further recommendations after colonoscopy

## 2012-07-25 NOTE — Patient Instructions (Addendum)
Your physician has requested that you go to the basement for lab work before leaving today.  You have been scheduled for a colonoscopy with propofol. Please follow written instructions given to you at your visit today.  Please pick up your prep kit at the pharmacy within the next 1-3 days. If you use inhalers (even only as needed) or a CPAP machine, please bring them with you on the day of your procedure.

## 2012-07-26 ENCOUNTER — Encounter: Payer: Self-pay | Admitting: Internal Medicine

## 2012-07-26 LAB — TISSUE TRANSGLUTAMINASE, IGA: Tissue Transglutaminase Ab, IgA: 3.5 U/mL (ref <20)

## 2012-08-02 ENCOUNTER — Ambulatory Visit (AMBULATORY_SURGERY_CENTER): Payer: PRIVATE HEALTH INSURANCE | Admitting: Internal Medicine

## 2012-08-02 ENCOUNTER — Encounter: Payer: Self-pay | Admitting: Internal Medicine

## 2012-08-02 VITALS — BP 129/85 | HR 73 | Temp 96.8°F | Resp 22 | Ht 63.0 in | Wt 147.0 lb

## 2012-08-02 DIAGNOSIS — K635 Polyp of colon: Secondary | ICD-10-CM

## 2012-08-02 DIAGNOSIS — D126 Benign neoplasm of colon, unspecified: Secondary | ICD-10-CM

## 2012-08-02 DIAGNOSIS — K625 Hemorrhage of anus and rectum: Secondary | ICD-10-CM

## 2012-08-02 DIAGNOSIS — Z1211 Encounter for screening for malignant neoplasm of colon: Secondary | ICD-10-CM

## 2012-08-02 DIAGNOSIS — R195 Other fecal abnormalities: Secondary | ICD-10-CM

## 2012-08-02 MED ORDER — SODIUM CHLORIDE 0.9 % IV SOLN: 500.0000 mL | INTRAVENOUS | Status: DC

## 2012-08-02 NOTE — Progress Notes (Signed)
Patient did not experience any of the following events: a burn prior to discharge; a fall within the facility; wrong site/side/patient/procedure/implant event; or a hospital transfer or hospital admission upon discharge from the facility. (G8907) Patient did not have preoperative order for IV antibiotic SSI prophylaxis. (G8918)  

## 2012-08-02 NOTE — Patient Instructions (Addendum)

## 2012-08-02 NOTE — Progress Notes (Signed)
Lidocaine-40mg IV prior to Propofol InductionPropofol given over incremental dosages 

## 2012-08-02 NOTE — Progress Notes (Signed)
Called to room to assist during endoscopic procedure.  Patient ID and intended procedure confirmed with present staff. Received instructions for my participation in the procedure from the performing physician.  

## 2012-08-02 NOTE — Op Note (Signed)
[facility] [address] [place], [address]   COLONOSCOPY PROCEDURE REPORT  PATIENT: Nicolas, Cantu  MR#: [id number] BIRTHDATE: 1965-12-18 , 46  yrs. old GENDER: Male ENDOSCOPIST: Beverley Fiedler, MD REFERRED [id number]:JYNW, Fayrene Fearing PROCEDURE DATE:  08/02/2012 PROCEDURE:   Colonoscopy, screening ASA CLASS:   Class II INDICATIONS:average risk screening, Change in bowel habits, and first colonoscopy. MEDICATIONS: MAC sedation, administered by CRNA and propofol (Diprivan) 200mg  IV  DESCRIPTION OF PROCEDURE:   After the risks benefits and alternatives of the procedure were thoroughly explained, informed consent was obtained.  A digital rectal exam revealed no rectal mass.   The LB CF-H180AL [id number]  endoscope was introduced through the anus and advanced to the terminal ileum which was intubated for a short distance. No adverse events experienced.   The quality of the prep was good, using MoviPrep  The instrument was then slowly withdrawn as the colon was fully examined.   COLON FINDINGS: The mucosa appeared normal in the terminal ileum. A sessile polyp measuring 7 mm in size was found at the cecum.  A polypectomy was performed with a cold snare.  The resection was complete and the polyp tissue was completely retrieved.   Two sessile polyps measuring 4 mm in size were found in the transverse colon.  Polypectomy was performed using cold snare.  All resections were complete and all polyp tissue was completely retrieved.   A semi-pedunculated polyp measuring 5 mm in size was found in the distal sigmoid colon.  A polypectomy was performed with a cold snare.  The resection was complete and the polyp tissue was completely retrieved.  Retroflexed views revealed external hemorrhoids. The time to cecum=1 minutes 57 seconds.  Withdrawal time=15 minutes 52 seconds.  The scope was withdrawn and the procedure completed. COMPLICATIONS: There were no complications.  ENDOSCOPIC  IMPRESSION: 1.   Normal mucosa in the terminal ileum 2.   Sessile polyp measuring 7 mm in size was found at the cecum; polypectomy was performed with a cold snare 3.   Two sessile polyps measuring 4 mm in size were found in the transverse colon; Polypectomy was performed using cold snare 4.   Semi-pedunculated polyp measuring 5 mm in size was found in the distal sigmoid colon; polypectomy was performed with a cold snare  RECOMMENDATIONS: 1.  Hold aspirin, aspirin products, and anti-inflammatory medication for 1 week. 2.  Await pathology results 3.  If the polyps removed today are proven to be adenomatous (pre-cancerous) polyps, you will need a colonoscopy in 3 years. Otherwise you should continue to follow colorectal cancer screening guidelines for "routine risk" patients with a colonoscopy in 10 years.  You will receive a letter within 1-2 weeks with the results of your biopsy as well as final recommendations.  Please call my office if you have not received a letter after 3 weeks.   eSigned:  Beverley Fiedler, MD 08/02/2012 9:40 AM   cc: Corwin Levins, MD and The Patient   PATIENT NAME:  Nicolas, Cantu MR#: [id number]

## 2012-08-03 ENCOUNTER — Telehealth: Payer: Self-pay | Admitting: *Deleted

## 2012-08-03 NOTE — Telephone Encounter (Signed)
Number identifier, left message, follow-up  

## 2012-08-08 ENCOUNTER — Encounter: Payer: Self-pay | Admitting: Internal Medicine

## 2012-08-24 ENCOUNTER — Encounter: Payer: Self-pay | Admitting: Internal Medicine

## 2012-08-24 ENCOUNTER — Ambulatory Visit (INDEPENDENT_AMBULATORY_CARE_PROVIDER_SITE_OTHER): Payer: PRIVATE HEALTH INSURANCE | Admitting: Internal Medicine

## 2012-08-24 VITALS — BP 122/80 | HR 92 | Temp 98.5°F | Ht 63.0 in | Wt 146.1 lb

## 2012-08-24 DIAGNOSIS — Z0000 Encounter for general adult medical examination without abnormal findings: Secondary | ICD-10-CM

## 2012-08-24 DIAGNOSIS — J45909 Unspecified asthma, uncomplicated: Secondary | ICD-10-CM

## 2012-08-24 DIAGNOSIS — E785 Hyperlipidemia, unspecified: Secondary | ICD-10-CM

## 2012-08-24 DIAGNOSIS — K219 Gastro-esophageal reflux disease without esophagitis: Secondary | ICD-10-CM | POA: Insufficient documentation

## 2012-08-24 DIAGNOSIS — N529 Male erectile dysfunction, unspecified: Secondary | ICD-10-CM | POA: Insufficient documentation

## 2012-08-24 DIAGNOSIS — C3490 Malignant neoplasm of unspecified part of unspecified bronchus or lung: Secondary | ICD-10-CM

## 2012-08-24 DIAGNOSIS — Z8619 Personal history of other infectious and parasitic diseases: Secondary | ICD-10-CM

## 2012-08-24 HISTORY — DX: Personal history of other infectious and parasitic diseases: Z86.19

## 2012-08-24 MED ORDER — ATORVASTATIN CALCIUM 20 MG PO TABS: 20.0000 mg | ORAL_TABLET | Freq: Every day | ORAL | Status: DC

## 2012-08-24 MED ORDER — SILDENAFIL CITRATE 100 MG PO TABS: 50.0000 mg | ORAL_TABLET | Freq: Every day | ORAL | Status: DC | PRN

## 2012-08-24 MED ORDER — PANTOPRAZOLE SODIUM 40 MG PO TBEC: 40.0000 mg | DELAYED_RELEASE_TABLET | Freq: Every day | ORAL | Status: DC

## 2012-08-24 NOTE — Assessment & Plan Note (Addendum)
Ok for change dexilant to protonix for less cost, no red flags,  to f/u any worsening symptoms or concerns  Note:  Total time for pt hx, exam, review of record with pt in the room, determination of diagnoses and plan for further eval and tx is > 40 min, with over 50% spent in coordination and counseling of patient

## 2012-08-24 NOTE — Assessment & Plan Note (Signed)
Has f/u CT and exam soon

## 2012-08-24 NOTE — Assessment & Plan Note (Signed)
Stable exam today,  to f/u any worsening symptoms or concerns °

## 2012-08-24 NOTE — Assessment & Plan Note (Signed)
stable overall by history and exam, recent data reviewed with pt, and pt to continue medical treatment as before,  to f/u any worsening symptoms or concerns Lab Results  Component Value Date   HGBA1C 6.1 03/16/2012

## 2012-08-24 NOTE — Progress Notes (Signed)
Subjective:    Patient ID: Nicolas Cantu, male    DOB: March 19, 1966, 47 y.o.   MRN: [id number]  HPI    Here with mild to mod reflux symptoms despite prior dexilant, wondering if this will be a long term problem, has been ongoing for over 3 months.  Denies worsening abd pain, dysphagia, n/v, bowel change or blood, and no ongoing cough or wt loss.  Also with worsening ED symtpoms and decreased libido somewhat for about 6 mo, asks for trial viagra, and eventually to check testosterone.  Needs refill lipitor, has run out, and not really trying to follow lower chol diet.  Mentions some mucous related AM cough since his chest surgury and some vague chest fullness and not able to take a full breath but Pt denies chest pain, increased sob or doe, wheezing, orthopnea, PND, increased LE swelling, palpitations, dizziness or syncope.  Has f/u with chest surgury soon.   Pt denies fever, wt loss, night sweats, loss of appetite, or other constitutional symptoms.  Pt denies polydipsia, polyuria, or low sugar symptoms such as weakness or confusion improved with po intake.  Pt states overall good compliance with meds  Also mentions hx of remote tx syphliis, wonders if was related to his lung cancer Past Medical History  Diagnosis Date  . DIABETES MELLITUS, UNCONTROLLED 02/17/2009  . Diarrhea 02/17/2009  . HEMATOCHEZIA 02/17/2009  . HYPERLIPIDEMIA 02/17/2009  . LEG PAIN, BILATERAL 02/17/2009  . OSTEOARTHRITIS, KNEES, BILATERAL 05/14/2010  . Polyuria 03/24/2010  . Preventative health care 11/15/2010  . Allergic rhinitis, cause unspecified 02/26/2011  . Asthma 02/26/2011  . Shortness of breath   . Lung mass   . 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 Diabetes mellitus     ?  Marland Kitchen Hepatitis     hx  . Lung cancer 2013    LUL   No past surgical history on file.  reports that he has never smoked. He has never used smokeless tobacco. He reports that he does not drink alcohol or use illicit drugs. family history is not on file. No  Known Allergies Current Outpatient Prescriptions on File Prior to Visit  Medication Sig Dispense Refill  . aspirin 81 MG EC tablet Take 81 mg by mouth daily.        . traMADol (ULTRAM) 50 MG tablet Take 1 tablet (50 mg total) by mouth every 8 (eight) hours as needed for pain.  30 tablet  0  . dexlansoprazole (DEXILANT) 60 MG capsule Take 1 capsule (60 mg total) by mouth daily.  30 capsule  11   No current facility-administered medications on file prior to visit.   Review of Systems  Constitutional: Negative for unexpected weight change, or unusual diaphoresis  HENT: Negative for tinnitus.   Eyes: Negative for photophobia and visual disturbance.  Respiratory: Negative for choking and stridor.   Gastrointestinal: Negative for vomiting and blood in stool.  Genitourinary: Negative for hematuria and decreased urine volume.  Musculoskeletal: Negative for acute joint swelling Skin: Negative for color change and wound.  Neurological: Negative for tremors and numbness other than noted  Psychiatric/Behavioral: Negative for decreased concentration or  hyperactivity.       Objective:   Physical Exam BP 122/80  Pulse 92  Temp(Src) 98.5 F (36.9 C) (Oral)  Ht 5\' 3"  (1.6 m)  Wt 146 lb 2 oz (66.282 kg)  BMI 25.89 kg/m2  SpO2 97% VS noted,  Constitutional: Pt appears well-developed and well-nourished.  HENT: Head: NCAT.  Right Ear: External ear normal.  Left Ear: External ear normal.  Eyes: Conjunctivae and EOM are normal. Pupils are equal, round, and reactive to light.  Neck: Normal range of motion. Neck supple.  Cardiovascular: Normal rate and regular rhythm.   Pulmonary/Chest: Effort normal and breath sounds normal.  Abd:  Soft, NT, non-distended, + BS Neurological: Pt is alert. Not confused  Skin: Skin is warm. No erythema.  Psychiatric: Pt behavior is normal. Thought content normal.     Assessment & Plan:

## 2012-08-24 NOTE — Assessment & Plan Note (Signed)
To re-start lipitor, f/u labs next visit, follow lower chol diet  Lab Results  Component Value Date   LDLCALC 78 07/05/2011

## 2012-08-24 NOTE — Assessment & Plan Note (Signed)
Ok for viagra prn,  to f/u any worsening symptoms or concerns  

## 2012-08-24 NOTE — Patient Instructions (Addendum)
Please take all new medication as prescribed - the viagra Please finish the dexilant that you have at home Then start the protonix 40 mg per day (you can take every other day if this controls the symptoms) Please continue all other medications as before, and refills have been done if requested - the lipitor Please continue your efforts at being more active, low cholesterol diet, and weight control. Please keep your appointments with your specialists as you have planned - the CT scan and f/u with [facility] (Dr Maren Beach) Thank you for enrolling in MyChart. Please follow the instructions below to securely access your online medical record. MyChart allows you to send messages to your doctor, view your test results, renew your prescriptions, schedule appointments, and more. To Log into My Chart online, please go by [employer] or [employer] to [email or web address], or download the MyChart App from the [employer] .  Your Username is: [email or web address] (pass: [id number]) Please return in 6 months, or sooner if needed, with Lab testing done 3-5 days before

## 2012-10-16 ENCOUNTER — Other Ambulatory Visit: Payer: Self-pay

## 2012-10-16 DIAGNOSIS — D381 Neoplasm of uncertain behavior of trachea, bronchus and lung: Secondary | ICD-10-CM

## 2012-10-18 ENCOUNTER — Encounter: Payer: Self-pay | Admitting: Cardiothoracic Surgery

## 2012-10-18 ENCOUNTER — Ambulatory Visit (INDEPENDENT_AMBULATORY_CARE_PROVIDER_SITE_OTHER): Payer: PRIVATE HEALTH INSURANCE | Admitting: Cardiothoracic Surgery

## 2012-10-18 ENCOUNTER — Ambulatory Visit (HOSPITAL_COMMUNITY)
Admission: RE | Admit: 2012-10-18 | Discharge: 2012-10-18 | Disposition: A | Discharge status: Home / Self Care | Payer: PRIVATE HEALTH INSURANCE | Source: Ambulatory Visit | Attending: Cardiothoracic Surgery | Admitting: Cardiothoracic Surgery

## 2012-10-18 VITALS — BP 109/67 | HR 80 | Resp 20 | Ht 63.0 in | Wt 146.0 lb

## 2012-10-18 DIAGNOSIS — R05 Cough: Secondary | ICD-10-CM | POA: Insufficient documentation

## 2012-10-18 DIAGNOSIS — Z9889 Other specified postprocedural states: Secondary | ICD-10-CM

## 2012-10-18 DIAGNOSIS — C3490 Malignant neoplasm of unspecified part of unspecified bronchus or lung: Secondary | ICD-10-CM

## 2012-10-18 DIAGNOSIS — Z902 Acquired absence of lung [part of]: Secondary | ICD-10-CM | POA: Insufficient documentation

## 2012-10-18 DIAGNOSIS — R059 Cough, unspecified: Secondary | ICD-10-CM | POA: Insufficient documentation

## 2012-10-18 DIAGNOSIS — D381 Neoplasm of uncertain behavior of trachea, bronchus and lung: Secondary | ICD-10-CM

## 2012-10-18 DIAGNOSIS — C3492 Malignant neoplasm of unspecified part of left bronchus or lung: Secondary | ICD-10-CM

## 2012-10-18 NOTE — Progress Notes (Signed)
PCP is Oliver Barre, MD Referring Provider is Corwin Levins, MD  Chief Complaint  Patient presents with  . 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 GFR positive Patient asymptomatic back to work CT scan of the chest today clean no evidence recurrence or new lesions Patient still experiencing some intermittent coughing but much better and less frequent   Past Medical History  Diagnosis Date  . DIABETES MELLITUS, UNCONTROLLED 02/17/2009  . Diarrhea 02/17/2009  . HEMATOCHEZIA 02/17/2009  . HYPERLIPIDEMIA 02/17/2009  . LEG PAIN, BILATERAL 02/17/2009  . OSTEOARTHRITIS, KNEES, BILATERAL 05/14/2010  . Polyuria 03/24/2010  . Preventative health care 11/15/2010  . Allergic rhinitis, cause unspecified 02/26/2011  . Asthma 02/26/2011  . Shortness of breath   . Lung mass   . 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 Diabetes mellitus     ?  Marland Kitchen Hepatitis     hx  . Lung cancer 2013    LUL  . History of syphilis 08/24/2012    Remote tx    No past surgical history on file.  No family history on file.  Social History History  Substance Use Topics  . Smoking status: Never Smoker   . Smokeless tobacco: Never Used  . Alcohol Use: No     Comment: rare    Current Outpatient Prescriptions  Medication Sig Dispense Refill  . aspirin 81 MG EC tablet Take 81 mg by mouth daily.        Marland Kitchen atorvastatin (LIPITOR) 20 MG tablet Take 1 tablet (20 mg total) by mouth daily.  90 tablet  3  . dexlansoprazole (DEXILANT) 60 MG capsule Take 1 capsule (60 mg total) by mouth daily.  30 capsule  11   No current facility-administered medications for this visit.    No Known Allergies  Review of Systems weight stable no fever no shortness of breath  BP 109/67  Pulse 80  Resp 20  Ht 5\' 3"  (1.6 m)  Wt 146 lb (66.225 kg)  BMI 25.87 kg/m2  SpO2 98% Physical  Exam Alert and comfortable Breath sounds clear and equal Left thoracotomy scar well-healed Cardiac rhythm regular Neuro intact  Diagnostic Tests: ET scan and chest shows no evidence recurrence or new lesions one year following left upper lobe resection for stage I non-small cell carcinoma  Impression: No evidence recurrent cancer one year postop  Plan: Return with CTA chest in one year for surveillance of recurrence

## 2012-10-23 ENCOUNTER — Telehealth: Payer: Self-pay | Admitting: Internal Medicine

## 2012-10-23 NOTE — Telephone Encounter (Signed)
Hx of lung ca with LU Lobectomy for Stage 1 Adenocarcinoma. Seen by Dr Rhea Belton 07/25/12 for loose stool and intermittent rectal bleeding. COLON 08/02/12 with adenomatous polyps.

## 2012-10-24 NOTE — Telephone Encounter (Signed)
Pt reports upper abdominal pain above his waist in the entire area; not specific to one side. He also c/o of the pain at night as well as increased bloating; he is on Dexilant  Daily. For the past 2 days, pt has passed gas with a green oily discharge. His BMs are less soft and with more sections. Pt was given an appt on 10/26/12 at 3pm.

## 2012-10-24 NOTE — Telephone Encounter (Signed)
lmom for pt to call back

## 2012-10-26 ENCOUNTER — Ambulatory Visit: Payer: PRIVATE HEALTH INSURANCE | Admitting: Physician Assistant

## 2012-10-27 ENCOUNTER — Ambulatory Visit (INDEPENDENT_AMBULATORY_CARE_PROVIDER_SITE_OTHER): Payer: PRIVATE HEALTH INSURANCE | Admitting: Physician Assistant

## 2012-10-27 ENCOUNTER — Ambulatory Visit: Payer: PRIVATE HEALTH INSURANCE

## 2012-10-27 ENCOUNTER — Encounter: Payer: Self-pay | Admitting: Physician Assistant

## 2012-10-27 VITALS — BP 100/70 | HR 80 | Ht 63.0 in | Wt 149.4 lb

## 2012-10-27 DIAGNOSIS — R140 Abdominal distension (gaseous): Secondary | ICD-10-CM

## 2012-10-27 DIAGNOSIS — Z85118 Personal history of other malignant neoplasm of bronchus and lung: Secondary | ICD-10-CM

## 2012-10-27 DIAGNOSIS — R1012 Left upper quadrant pain: Secondary | ICD-10-CM

## 2012-10-27 DIAGNOSIS — R1011 Right upper quadrant pain: Secondary | ICD-10-CM

## 2012-10-27 DIAGNOSIS — R143 Flatulence: Secondary | ICD-10-CM

## 2012-10-27 LAB — CBC WITH DIFFERENTIAL/PLATELET
Basophils Relative: 0 % (ref 0–1)
Eosinophils Absolute: 0.1 10*3/uL (ref 0.0–0.7)
MCH: 30.6 pg (ref 26.0–34.0)
MCHC: 35.9 g/dL (ref 30.0–36.0)
Monocytes Relative: 7 % (ref 3–12)
Neutrophils Relative %: 55 % (ref 43–77)
Platelets: 242 10*3/uL (ref 150–400)

## 2012-10-27 LAB — COMPREHENSIVE METABOLIC PANEL
ALT: 31 U/L (ref 0–53)
AST: 23 U/L (ref 0–37)
Albumin: 4 g/dL (ref 3.5–5.2)
Calcium: 9 mg/dL (ref 8.4–10.5)
Chloride: 102 mEq/L (ref 96–112)
Potassium: 4.2 mEq/L (ref 3.5–5.1)

## 2012-10-27 LAB — HEPATIC FUNCTION PANEL
Albumin: 4 g/dL (ref 3.5–5.2)
Alkaline Phosphatase: 72 U/L (ref 39–117)

## 2012-10-27 LAB — LIPASE: Lipase: 24 U/L (ref 11.0–59.0)

## 2012-10-27 NOTE — Patient Instructions (Addendum)
Please go to the basement level to have your labs drawn.  Stay on the Dexilant medication.   You have been scheduled for a CT scan of the abdomen and pelvis at [facility] ([address]---this is in the same building as Architectural [facility]).   You are scheduled on 11-01-2012 at 3:30 PM . You should arrive at 3:15 PM  prior to your appointment time for registration. Please follow the written instructions below on the day of your exam:  WARNING: IF YOU ARE ALLERGIC TO IODINE/X-RAY DYE, PLEASE NOTIFY RADIOLOGY IMMEDIATELY AT [phone]! YOU WILL BE GIVEN A 13 HOUR PREMEDICATION PREP.  1) Do not eat or drink anything after 11:30 AM  (4 hours prior to your test) 2) You have been given 2 bottles of oral contrast to drink. The solution may taste better if refrigerated, but do NOT add ice or any other liquid to this solution. Shake  well before drinking.    Drink 1 bottle of contrast @ 1:30 PM  (2 hours prior to your exam)  Drink 1 bottle of contrast @ 2:30 PM  (1 hour prior to your exam)  You may take any medications as prescribed with a small amount of water except for the following: Metformin, Glucophage, Glucovance, Avandamet, Riomet, Fortamet, Actoplus Met, Janumet, Glumetza or Metaglip. The above medications must be held the day of the exam AND 48 hours after the exam.  The purpose of you drinking the oral contrast is to aid in the visualization of your intestinal tract. The contrast solution may cause some diarrhea. Before your exam is started, you will be given a small amount of fluid to drink. Depending on your individual set of symptoms, you may also receive an intravenous injection of x-ray contrast/dye. Plan on being at [facility] for 30 minutes or long, depending on the type of exam you are having performed.  If you have any questions regarding your exam or if you need to reschedule, you may call the CT department at [phone] between the hours of 8:00 am and 5:00  pm, Monday-Friday.  ________________________________________________________________________
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
## 2012-11-01 ENCOUNTER — Ambulatory Visit: Payer: PRIVATE HEALTH INSURANCE | Admitting: Cardiothoracic Surgery

## 2012-11-01 ENCOUNTER — Ambulatory Visit (INDEPENDENT_AMBULATORY_CARE_PROVIDER_SITE_OTHER)
Admission: RE | Admit: 2012-11-01 | Discharge: 2012-11-01 | Disposition: A | Discharge status: Home / Self Care | Payer: PRIVATE HEALTH INSURANCE | Source: Ambulatory Visit | Attending: Physician Assistant | Admitting: Physician Assistant

## 2012-11-01 ENCOUNTER — Telehealth: Payer: Self-pay | Admitting: *Deleted

## 2012-11-01 DIAGNOSIS — Z85118 Personal history of other malignant neoplasm of bronchus and lung: Secondary | ICD-10-CM

## 2012-11-01 DIAGNOSIS — R1012 Left upper quadrant pain: Secondary | ICD-10-CM

## 2012-11-01 DIAGNOSIS — R140 Abdominal distension (gaseous): Secondary | ICD-10-CM

## 2012-11-01 DIAGNOSIS — R143 Flatulence: Secondary | ICD-10-CM

## 2012-11-01 DIAGNOSIS — R1011 Right upper quadrant pain: Secondary | ICD-10-CM

## 2012-11-01 DIAGNOSIS — R141 Gas pain: Secondary | ICD-10-CM

## 2012-11-01 MED ORDER — IOHEXOL 300 MG/ML  SOLN
100.0000 mL | Freq: Once | INTRAMUSCULAR | Status: AC | PRN
  Administered 2012-11-01: 100 mL via INTRAVENOUS

## 2012-11-01 NOTE — Telephone Encounter (Signed)
I called the patient and left a message for him to call me. Our lab drew blood on Fri 10-27-12.  They didn't draw the STD Panel order I put in.  I need to ask the pt if he still wants the test.  If so he will have to come back to our lab.

## 2012-11-02 ENCOUNTER — Telehealth: Payer: Self-pay | Admitting: *Deleted

## 2012-11-02 NOTE — Telephone Encounter (Signed)
I called the pt to advise him that our lab neglected to do the STD Panel test when we drew his blood.  I apologized to him.  I asked if he still wanted [place] to do the test. He said he didn't want to now.  He said it was okay.  He will let us know if he changed his mind.

## 2012-11-03 ENCOUNTER — Other Ambulatory Visit: Payer: Self-pay | Admitting: *Deleted

## 2012-11-03 DIAGNOSIS — R1012 Left upper quadrant pain: Secondary | ICD-10-CM

## 2012-11-15 ENCOUNTER — Ambulatory Visit (AMBULATORY_SURGERY_CENTER): Payer: Managed Care, Other (non HMO) | Admitting: *Deleted

## 2012-11-15 VITALS — Ht 63.0 in | Wt 145.8 lb

## 2012-11-15 DIAGNOSIS — R109 Unspecified abdominal pain: Secondary | ICD-10-CM

## 2012-11-15 DIAGNOSIS — R1012 Left upper quadrant pain: Secondary | ICD-10-CM

## 2012-11-15 NOTE — Progress Notes (Signed)
Denies allergies to eggs or soy products. Denies complications with anesthesia or sedation. 

## 2012-11-16 ENCOUNTER — Encounter: Payer: Self-pay | Admitting: Internal Medicine

## 2012-11-23 ENCOUNTER — Ambulatory Visit (AMBULATORY_SURGERY_CENTER): Payer: Managed Care, Other (non HMO) | Admitting: Internal Medicine

## 2012-11-23 ENCOUNTER — Encounter: Payer: Self-pay | Admitting: Internal Medicine

## 2012-11-23 VITALS — BP 120/75 | HR 68 | Temp 98.4°F | Resp 22 | Ht 63.0 in | Wt 145.0 lb

## 2012-11-23 DIAGNOSIS — K219 Gastro-esophageal reflux disease without esophagitis: Secondary | ICD-10-CM

## 2012-11-23 DIAGNOSIS — K2970 Gastritis, unspecified, without bleeding: Secondary | ICD-10-CM

## 2012-11-23 DIAGNOSIS — R109 Unspecified abdominal pain: Secondary | ICD-10-CM

## 2012-11-23 DIAGNOSIS — K2990 Gastroduodenitis, unspecified, without bleeding: Secondary | ICD-10-CM

## 2012-11-23 DIAGNOSIS — A048 Other specified bacterial intestinal infections: Secondary | ICD-10-CM

## 2012-11-23 LAB — GLUCOSE, CAPILLARY: Glucose-Capillary: 88 mg/dL (ref 70–99)

## 2012-11-23 MED ORDER — SODIUM CHLORIDE 0.9 % IV SOLN: 500.0000 mL | INTRAVENOUS | Status: DC

## 2012-11-23 NOTE — Op Note (Signed)
[facility] [address] [place], [address]   ENDOSCOPY PROCEDURE REPORT  PATIENT: Nicolas, Cantu  MR#: [id number] BIRTHDATE: 10-19-1965 , 46  yrs. old GENDER: Male ENDOSCOPIST: Beverley Fiedler, MD PROCEDURE DATE:  11/23/2012 PROCEDURE:  EGD w/ biopsy for H.pylori ASA CLASS:     Class III INDICATIONS:  Epigastric pain. MEDICATIONS: MAC sedation, administered by CRNA and propofol (Diprivan) 100mg  IV TOPICAL ANESTHETIC: none  DESCRIPTION OF PROCEDURE: After the risks benefits and alternatives of the procedure were thoroughly explained, informed consent was obtained.  The LB [id number] endoscope was introduced through the mouth and advanced to the second portion of the duodenum. Without limitations.  The instrument was slowly withdrawn as the mucosa was fully examined.     ESOPHAGUS: The mucosa of the esophagus appeared normal.  STOMACH: The mucosa of the stomach appeared normal.  Biopsies were taken in the antrum and angularis.  DUODENUM: The duodenal mucosa showed no abnormalities in the bulb and second portion of the duodenum.  Retroflexed views revealed no abnormalities.     The scope was then withdrawn from the patient and the procedure completed.  COMPLICATIONS: There were no complications.  ENDOSCOPIC IMPRESSION: 1.   The mucosa of the esophagus appeared normal 2.   The mucosa of the stomach appeared normal; biopsies were taken in the antrum and angularis 3.   The duodenal mucosa showed no abnormalities in the bulb and second portion of the duodenum  RECOMMENDATIONS: 1.  Await pathology results 2.  Continue current medications 3.  Follow-up of helicobacter pylori status, treat if indicated  eSigned:  Beverley Fiedler, MD 11/23/2012 4:39 PM   CC:The Patient and Corwin Levins, MD

## 2012-11-23 NOTE — Progress Notes (Signed)
Called to room to assist during endoscopic procedure.  Patient ID and intended procedure confirmed with present staff. Received instructions for my participation in the procedure from the performing physician.  

## 2012-11-23 NOTE — Patient Instructions (Addendum)
Discharge instructions given with verbal understanding. Biopsies taken. Resume previous medications. YOU HAD AN ENDOSCOPIC PROCEDURE TODAY AT THE [facility]: Refer to the procedure report that was given to you for any specific questions about what was found during the examination.  If the procedure report does not answer your questions, please call your gastroenterologist to clarify.  If you requested that your care partner not be given the details of your procedure findings, then the procedure report has been included in a sealed envelope for you to review at your convenience later.  YOU SHOULD EXPECT: Some feelings of bloating in the abdomen. Passage of more gas than usual.  Walking can help get rid of the air that was put into your GI tract during the procedure and reduce the bloating. If you had a lower endoscopy (such as a colonoscopy or flexible sigmoidoscopy) you may notice spotting of blood in your stool or on the toilet paper. If you underwent a bowel prep for your procedure, then you may not have a normal bowel movement for a few days.  DIET: Your first meal following the procedure should be a light meal and then it is ok to progress to your normal diet.  A half-sandwich or bowl of soup is an example of a good first meal.  Heavy or fried foods are harder to digest and may make you feel nauseous or bloated.  Likewise meals heavy in dairy and vegetables can cause extra gas to form and this can also increase the bloating.  Drink plenty of fluids but you should avoid alcoholic beverages for 24 hours.  ACTIVITY: Your care partner should take you home directly after the procedure.  You should plan to take it easy, moving slowly for the rest of the day.  You can resume normal activity the day after the procedure however you should NOT DRIVE or use heavy machinery for 24 hours (because of the sedation medicines used during the test).    SYMPTOMS TO REPORT IMMEDIATELY: A gastroenterologist  can be reached at any hour.  During normal business hours, 8:30 AM to 5:00 PM Monday through Friday, call [phone].  After hours and on weekends, please call the GI answering service at [phone] who will take a message and have the physician on call contact you.   Following upper endoscopy (EGD)  Vomiting of blood or coffee ground material  New chest pain or pain under the shoulder blades  Painful or persistently difficult swallowing  New shortness of breath  Fever of 100F or higher  Black, tarry-looking stools  FOLLOW UP: If any biopsies were taken you will be contacted by phone or by letter within the next 1-3 weeks.  Call your gastroenterologist if you have not heard about the biopsies in 3 weeks.  Our staff will call the home number listed on your records the next business day following your procedure to check on you and address any questions or concerns that you may have at that time regarding the information given to you following your procedure. This is a courtesy call and so if there is no answer at the home number and we have not heard from you through the emergency physician on call, we will assume that you have returned to your regular daily activities without incident.  SIGNATURES/CONFIDENTIALITY: You and/or your care partner have signed paperwork which will be entered into your electronic medical record.  These signatures attest to the fact that that the information above on your After   Visit Summary has been reviewed and is understood.  Full responsibility of the confidentiality of this discharge information lies with you and/or your care-partner. 

## 2012-11-23 NOTE — Progress Notes (Signed)
Patient did not experience any of the following events: a burn prior to discharge; a fall within the facility; wrong site/side/patient/procedure/implant event; or a hospital transfer or hospital admission upon discharge from the facility. (G8907) Patient did not have preoperative order for IV antibiotic SSI prophylaxis. (G8918)  

## 2012-11-23 NOTE — Progress Notes (Signed)
Lidocaine-40mg IV prior to Propofol InductionPropofol given over incremental dosages 

## 2012-11-24 ENCOUNTER — Telehealth: Payer: Self-pay | Admitting: *Deleted

## 2012-11-24 NOTE — Telephone Encounter (Signed)
Number identifier, left message, follow-up  

## 2012-11-28 ENCOUNTER — Encounter: Payer: Self-pay | Admitting: Internal Medicine

## 2012-12-01 ENCOUNTER — Telehealth: Payer: Self-pay | Admitting: *Deleted

## 2012-12-01 NOTE — Telephone Encounter (Signed)
lmom for pt to call back

## 2012-12-01 NOTE — Telephone Encounter (Signed)
Message copied by Florene Glen on Fri Dec 01, 2012  1:56 PM ------      Message from: Beverley Fiedler      Created: Tue Nov 28, 2012  4:33 PM       H. pylori positive which likely explains his symptoms; please treat with Pylera and BID PPI      Confirm eradication with breath test or stool Ag 3 months after treatment off PPI       ------

## 2012-12-04 ENCOUNTER — Telehealth: Payer: Self-pay

## 2012-12-04 MED ORDER — DEXLANSOPRAZOLE 60 MG PO CPDR: DELAYED_RELEASE_CAPSULE | ORAL | Status: DC

## 2012-12-04 MED ORDER — BIS SUBCIT-METRONID-TETRACYC 140-125-125 MG PO CAPS: 3.0000 | ORAL_CAPSULE | Freq: Three times a day (TID) | ORAL | Status: DC

## 2012-12-04 NOTE — Telephone Encounter (Signed)
I believe Dr Rhea Belton wanted to see pt back in 3 mo to see about eradication of the H pylori

## 2012-12-04 NOTE — Telephone Encounter (Signed)
Informed pt of the nned for Pylera and PPI x 10 days while taking Pylera. Reminder in for stool antigen or breath test in 3 months. Pt stated understanding and will call back if Pylera is too expensive.

## 2012-12-04 NOTE — Telephone Encounter (Signed)
The patient recently had an endoscopy and would like to know what MD to followup with regarding results.

## 2012-12-05 NOTE — Telephone Encounter (Signed)
Called the patient left detailed message of response from PCP

## 2013-03-06 ENCOUNTER — Telehealth: Payer: Self-pay | Admitting: *Deleted

## 2013-03-06 NOTE — Telephone Encounter (Signed)
Message copied by Florene Glen on Tue Mar 06, 2013  8:54 AM ------      Message from: Florene Glen      Created: Mon Dec 04, 2012  3:31 PM       Breath test or stool antigen in 3 months after starting pylera ------

## 2013-03-07 NOTE — Telephone Encounter (Signed)
Mailed pt a letter requesting he call to schedule an HP Breath Test; also mailed our old prep instructions.

## 2013-03-19 ENCOUNTER — Other Ambulatory Visit (INDEPENDENT_AMBULATORY_CARE_PROVIDER_SITE_OTHER): Payer: Managed Care, Other (non HMO)

## 2013-03-19 DIAGNOSIS — Z0000 Encounter for general adult medical examination without abnormal findings: Secondary | ICD-10-CM

## 2013-03-19 LAB — CBC WITH DIFFERENTIAL/PLATELET
Basophils Absolute: 0 10*3/uL (ref 0.0–0.1)
Hemoglobin: 14.5 g/dL (ref 13.0–17.0)
Lymphocytes Relative: 35.6 % (ref 12.0–46.0)
Monocytes Relative: 4.9 % (ref 3.0–12.0)
Neutro Abs: 3.4 10*3/uL (ref 1.4–7.7)
Neutrophils Relative %: 58.6 % (ref 43.0–77.0)
Platelets: 246 10*3/uL (ref 150.0–400.0)
RDW: 12.3 % (ref 11.5–14.6)

## 2013-03-19 LAB — BASIC METABOLIC PANEL
BUN: 14 mg/dL (ref 6–23)
CO2: 28 mEq/L (ref 19–32)
Calcium: 9.3 mg/dL (ref 8.4–10.5)
Chloride: 102 mEq/L (ref 96–112)
Creatinine, Ser: 1 mg/dL (ref 0.4–1.5)
Glucose, Bld: 104 mg/dL — ABNORMAL HIGH (ref 70–99)

## 2013-03-19 LAB — URINALYSIS, ROUTINE W REFLEX MICROSCOPIC
Hgb urine dipstick: NEGATIVE
Total Protein, Urine: NEGATIVE
Urine Glucose: NEGATIVE
WBC, UA: NONE SEEN (ref 0–?)

## 2013-03-19 LAB — TSH: TSH: 0.97 u[IU]/mL (ref 0.35–5.50)

## 2013-03-19 LAB — HEPATIC FUNCTION PANEL
AST: 22 U/L (ref 0–37)
Alkaline Phosphatase: 66 U/L (ref 39–117)
Bilirubin, Direct: 0.1 mg/dL (ref 0.0–0.3)
Total Bilirubin: 0.7 mg/dL (ref 0.3–1.2)

## 2013-03-19 LAB — LIPID PANEL
LDL Cholesterol: 51 mg/dL (ref 0–99)
Total CHOL/HDL Ratio: 2

## 2013-03-20 ENCOUNTER — Encounter: Payer: Self-pay | Admitting: Internal Medicine

## 2013-03-20 ENCOUNTER — Ambulatory Visit (INDEPENDENT_AMBULATORY_CARE_PROVIDER_SITE_OTHER): Payer: Managed Care, Other (non HMO) | Admitting: Internal Medicine

## 2013-03-20 VITALS — BP 102/62 | HR 112 | Temp 97.4°F | Ht 63.0 in | Wt 141.2 lb

## 2013-03-20 DIAGNOSIS — R7302 Impaired glucose tolerance (oral): Secondary | ICD-10-CM

## 2013-03-20 DIAGNOSIS — Z0000 Encounter for general adult medical examination without abnormal findings: Secondary | ICD-10-CM

## 2013-03-20 DIAGNOSIS — G4700 Insomnia, unspecified: Secondary | ICD-10-CM

## 2013-03-20 DIAGNOSIS — Z23 Encounter for immunization: Secondary | ICD-10-CM

## 2013-03-20 DIAGNOSIS — R7309 Other abnormal glucose: Secondary | ICD-10-CM

## 2013-03-20 MED ORDER — ZOLPIDEM TARTRATE 10 MG PO TABS: 10.0000 mg | ORAL_TABLET | Freq: Every evening | ORAL | Status: DC | PRN

## 2013-03-20 NOTE — Assessment & Plan Note (Signed)
Ok for [employer] prn

## 2013-03-20 NOTE — Progress Notes (Addendum)
Subjective:    Patient ID: Nicolas Cantu, male    DOB: 1966-04-22, 47 y.o.   MRN: [id number]  HPI Here for wellness and f/u;  Overall doing ok;  Pt denies CP, worsening SOB, DOE, wheezing, orthopnea, PND, worsening LE edema, palpitations, dizziness or syncope.  Pt denies neurological change such as new headache, facial or extremity weakness.  Pt denies polydipsia, polyuria, or low sugar symptoms. Pt states overall good compliance with treatment and medications, good tolerability, and has been trying to follow lower cholesterol diet.  Pt denies worsening depressive symptoms, suicidal ideation or panic. No fever, night sweats, wt loss, loss of appetite, or other constitutional symptoms.  Pt states good ability with ADL's, has low fall risk, home safety reviewed and adequate, no other significant changes in hearing or vision, and only occasionally active with exercise.  Does still c/o occas abd pain after tx for h pylori after recent CT and EGD.  Due for flu shot.  Also has some difficulty ongoing feeling as if he cannot take full deep breaths sometimes, but does not cause any functional issue. Prior tx empiric for asthma no help Denies worsening depressive symptoms, suicidal ideation, or panic; denies ongoing anxiety.  Does have occasional difficulty getting to sleep Past Medical History  Diagnosis Date  . DIABETES MELLITUS, UNCONTROLLED 02/17/2009  . Diarrhea 02/17/2009  . HEMATOCHEZIA 02/17/2009  . HYPERLIPIDEMIA 02/17/2009  . LEG PAIN, BILATERAL 02/17/2009  . OSTEOARTHRITIS, KNEES, BILATERAL 05/14/2010  . Polyuria 03/24/2010  . Preventative health care 11/15/2010  . Allergic rhinitis, cause unspecified 02/26/2011  . Asthma 02/26/2011  . Shortness of breath   . Lung mass   . 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 Diabetes mellitus     ?  Marland Kitchen Hepatitis     hx  . Lung cancer 2013    LUL  . History of syphilis 08/24/2012    Remote tx  . Abdominal pain   . Colon polyp    No past surgical history on file.  reports that he has never smoked. He has never used smokeless tobacco. He reports that he does not drink alcohol or use illicit drugs. family history is negative for Colon cancer, Esophageal cancer, Rectal cancer, and Stomach cancer. No Known Allergies Current Outpatient Prescriptions on File Prior to Visit  Medication Sig Dispense Refill  . aspirin 81 MG EC tablet Take 81 mg by mouth daily.        Marland Kitchen atorvastatin (LIPITOR) 20 MG tablet Take 1 tablet (20 mg total) by mouth daily.  90 tablet  3  . bismuth-metronidazole-tetracycline (PYLERA) 140-125-125 MG per capsule Take 3 capsules by mouth 4 (four) times daily -  before meals and at bedtime.  120 capsule  0  . dexlansoprazole (DEXILANT) 60 MG capsule Take one capsule by mouth twice daily while on Pylera  20 capsule  0  . dexlansoprazole (DEXILANT) 60 MG capsule Take 60 mg by mouth daily.       No current facility-administered medications on file prior to visit.   Review of Systems Constitutional: Negative for diaphoresis, activity change, appetite change or unexpected weight change.  HENT: Negative for hearing loss, ear pain, facial swelling, mouth sores and neck stiffness.   Eyes: Negative for pain, redness and visual disturbance.  Respiratory: Negative for shortness of breath and wheezing.   Cardiovascular: Negative for chest pain and palpitations.  Gastrointestinal: Negative for diarrhea, blood in stool, abdominal distention or other pain Genitourinary: Negative for  hematuria, flank pain or change in urine volume.  Musculoskeletal: Negative for myalgias and joint swelling.  Skin: Negative for color change and wound.  Neurological: Negative for syncope and numbness. other than noted Hematological: Negative for adenopathy.  Psychiatric/Behavioral: Negative for hallucinations, self-injury, decreased concentration and agitation.      Objective:   Physical Exam BP 102/62  Pulse 112  Temp(Src) 97.4 F (36.3 C) (Oral)  Ht 5\' 3"  (1.6 m)   Wt 141 lb 4 oz (64.071 kg)  BMI 25.03 kg/m2  SpO2 94% VS noted,  Constitutional: Pt is oriented to person, place, and time. Appears well-developed and well-nourished.  Head: Normocephalic and atraumatic.  Right Ear: External ear normal.  Left Ear: External ear normal.  Nose: Nose normal.  Mouth/Throat: Oropharynx is clear and moist.  Eyes: Conjunctivae and EOM are normal. Pupils are equal, round, and reactive to light.  Neck: Normal range of motion. Neck supple. No JVD present. No tracheal deviation present.  Cardiovascular: Normal rate, regular rhythm except tachy today - ? nervous, normal heart sounds and intact distal pulses.   Pulmonary/Chest: Effort normal and breath sounds normal.  Abdominal: Soft. Bowel sounds are normal. There is no tenderness. No HSM  Musculoskeletal: Normal range of motion. Exhibits no edema.  Lymphadenopathy:  Has no cervical adenopathy.  Neurological: Pt is alert and oriented to person, place, and time. Pt has normal reflexes. No cranial nerve deficit.  Skin: Skin is warm and dry. No rash noted.  Psychiatric:  Has  normal mood and affect. Behavior is normal. mild to mod nervous    Assessment & Plan:

## 2013-03-20 NOTE — Patient Instructions (Addendum)
You had the flu shot today Please continue all other medications as before, and refills have been done if requested. Please have the pharmacy call with any other refills you may need. Please continue your efforts at being more active, low cholesterol diet, and weight control. You are otherwise up to date with prevention measures today. Please keep your appointments with your specialists as you may have planned No further lab work needed today Your EKG was [facility] today, and you are given the lab results  Please remember to sign up for My Chart if you have not done so, as this will be important to you in the future with finding out test results, communicating by private email, and scheduling acute appointments online when needed.  Please return in 1 year for your yearly visit, or sooner if needed, with Lab testing done 3-5 days before

## 2013-03-20 NOTE — Assessment & Plan Note (Addendum)

## 2013-03-20 NOTE — Addendum Note (Signed)
Addended by: Corwin Levins on: 03/20/2013 03:41 PM   Modules accepted: Orders

## 2013-03-20 NOTE — Assessment & Plan Note (Signed)
stable overall by history and exam, recent data reviewed with pt, and pt to continue medical treatment as before,  to f/u any worsening symptoms or concerns, f/u 1 yr a1c

## 2013-04-17 ENCOUNTER — Other Ambulatory Visit: Payer: Self-pay | Admitting: Gastroenterology

## 2013-04-17 ENCOUNTER — Telehealth: Payer: Self-pay | Admitting: *Deleted

## 2013-04-17 NOTE — Telephone Encounter (Signed)
Pt called to schedule an H.Pylori Breath test. Scheduled for 04/19/13 at 07:15am. Emailed his prep instructions.

## 2013-04-18 ENCOUNTER — Ambulatory Visit: Payer: Managed Care, Other (non HMO) | Admitting: Internal Medicine

## 2013-04-19 ENCOUNTER — Ambulatory Visit (HOSPITAL_COMMUNITY)
Admission: RE | Admit: 2013-04-19 | Discharge: 2013-04-19 | Disposition: A | Discharge status: Home / Self Care | Payer: Managed Care, Other (non HMO) | Source: Ambulatory Visit | Attending: Internal Medicine | Admitting: Internal Medicine

## 2013-04-19 ENCOUNTER — Encounter (HOSPITAL_COMMUNITY)
Admission: RE | Disposition: A | Discharge status: Home / Self Care | Payer: Self-pay | Source: Ambulatory Visit | Attending: Internal Medicine

## 2013-04-19 DIAGNOSIS — A048 Other specified bacterial intestinal infections: Secondary | ICD-10-CM | POA: Insufficient documentation

## 2013-04-19 HISTORY — PX: BREATH TEK H PYLORI: SHX5422

## 2013-04-19 SURGERY — BREATH TEST, FOR HELICOBACTER PYLORI

## 2013-04-20 ENCOUNTER — Encounter (HOSPITAL_COMMUNITY): Payer: Self-pay | Admitting: Internal Medicine

## 2013-05-18 ENCOUNTER — Telehealth: Payer: Self-pay | Admitting: Internal Medicine

## 2013-05-18 NOTE — Telephone Encounter (Signed)
lmom for pt to call back

## 2013-05-18 NOTE — Telephone Encounter (Signed)
Pt left a message he had labs at the hospital and they aren't in my chart. lmom that I can't find any results except from 03/2013.

## 2013-05-29 ENCOUNTER — Ambulatory Visit (INDEPENDENT_AMBULATORY_CARE_PROVIDER_SITE_OTHER): Payer: Managed Care, Other (non HMO) | Admitting: Internal Medicine

## 2013-05-29 ENCOUNTER — Encounter: Payer: Self-pay | Admitting: Internal Medicine

## 2013-05-29 VITALS — BP 112/70 | HR 87 | Temp 98.2°F | Wt 144.0 lb

## 2013-05-29 DIAGNOSIS — R7302 Impaired glucose tolerance (oral): Secondary | ICD-10-CM

## 2013-05-29 DIAGNOSIS — R7309 Other abnormal glucose: Secondary | ICD-10-CM

## 2013-05-29 DIAGNOSIS — J209 Acute bronchitis, unspecified: Secondary | ICD-10-CM | POA: Insufficient documentation

## 2013-05-29 DIAGNOSIS — C3490 Malignant neoplasm of unspecified part of unspecified bronchus or lung: Secondary | ICD-10-CM

## 2013-05-29 DIAGNOSIS — R109 Unspecified abdominal pain: Secondary | ICD-10-CM

## 2013-05-29 MED ORDER — HYDROCODONE-HOMATROPINE 5-1.5 MG/5ML PO SYRP: 5.0000 mL | ORAL_SOLUTION | Freq: Four times a day (QID) | ORAL | Status: DC | PRN

## 2013-05-29 MED ORDER — DICYCLOMINE HCL 10 MG PO CAPS: 10.0000 mg | ORAL_CAPSULE | Freq: Three times a day (TID) | ORAL | Status: DC

## 2013-05-29 MED ORDER — LEVOFLOXACIN 250 MG PO TABS: 250.0000 mg | ORAL_TABLET | Freq: Every day | ORAL | Status: DC

## 2013-05-29 NOTE — Assessment & Plan Note (Signed)
Etiology unclear, ? Spastic functional, for trial bentyl , consider GI f/u

## 2013-05-29 NOTE — Progress Notes (Signed)
Subjective:    Patient ID: Nicolas Cantu, male    DOB: 10/21/1965, 47 y.o.   MRN: [id number]  HPI  Here with acute onset mild to mod 2-3 days ST, HA, general weakness and malaise, with prod cough greenish sputum also with small BRB, but Pt denies chest pain, increased sob or doe, wheezing, orthopnea, PND, increased LE swelling, palpitations, dizziness or syncope.  Also with ongoing mid /right abd pain now s/p egd/colonoscopy/CT and feels occas in right back.  Did apparently have a breath test for H pylori eradication but he has not heard results.   Past Medical History  Diagnosis Date  . DIABETES MELLITUS, UNCONTROLLED 02/17/2009  . Diarrhea 02/17/2009  . HEMATOCHEZIA 02/17/2009  . HYPERLIPIDEMIA 02/17/2009  . LEG PAIN, BILATERAL 02/17/2009  . OSTEOARTHRITIS, KNEES, BILATERAL 05/14/2010  . Polyuria 03/24/2010  . Preventative health care 11/15/2010  . Allergic rhinitis, cause unspecified 02/26/2011  . Asthma 02/26/2011  . Shortness of breath   . Lung mass   . 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 Diabetes mellitus     ?  Marland Kitchen Hepatitis     hx  . Lung cancer 2013    LUL  . History of syphilis 08/24/2012    Remote tx  . Abdominal pain   . Colon polyp    Past Surgical History  Procedure Laterality Date  . Breath tek h pylori N/A 04/19/2013    Procedure: BREATH TEK Richardo Priest;  Surgeon: Beverley Fiedler, MD;  Location: [facility];  Service: Gastroenterology;  Laterality: N/A;    reports that he has never smoked. He has never used smokeless tobacco. He reports that he does not drink alcohol or use illicit drugs. family history is negative for Colon cancer, Esophageal cancer, Rectal cancer, and Stomach cancer. No Known Allergies Current Outpatient Prescriptions on File Prior to Visit  Medication Sig Dispense Refill  . aspirin 81 MG EC tablet Take 81 mg by mouth daily.        Marland Kitchen atorvastatin (LIPITOR) 20 MG tablet Take 1 tablet (20 mg total) by mouth daily.  90 tablet  3  .  bismuth-metronidazole-tetracycline (PYLERA) 140-125-125 MG per capsule Take 3 capsules by mouth 4 (four) times daily -  before meals and at bedtime.  120 capsule  0  . dexlansoprazole (DEXILANT) 60 MG capsule Take one capsule by mouth twice daily while on Pylera  20 capsule  0  . dexlansoprazole (DEXILANT) 60 MG capsule Take 60 mg by mouth daily.      Marland Kitchen zolpidem (AMBIEN) 10 MG tablet Take 1 tablet (10 mg total) by mouth at bedtime as needed for sleep.  30 tablet  2   No current facility-administered medications on file prior to visit.   Review of Systems  Constitutional: Negative for unexpected weight change, or unusual diaphoresis  HENT: Negative for tinnitus.   Eyes: Negative for photophobia and visual disturbance.  Respiratory: Negative for choking and stridor.   Gastrointestinal: Negative for vomiting and blood in stool.  Genitourinary: Negative for hematuria and decreased urine volume.  Musculoskeletal: Negative for acute joint swelling Skin: Negative for color change and wound.  Neurological: Negative for tremors and numbness other than noted  Psychiatric/Behavioral: Negative for decreased concentration or  hyperactivity.       Objective:   Physical Exam BP 112/70  Pulse 87  Temp(Src) 98.2 F (36.8 C) (Oral)  Wt 144 lb (65.318 kg)  SpO2 97% VS noted, mild ill Constitutional: Pt  appears well-developed and well-nourished.  HENT: Head: NCAT.  Right Ear: External ear normal.  Left Ear: External ear normal.  Bilat tm's with mild erythema.  Max sinus areas non tender.  Pharynx with mild erythema, no exudate Eyes: Conjunctivae and EOM are normal. Pupils are equal, round, and reactive to light.  Neck: Normal range of motion. Neck supple.  Cardiovascular: Normal rate and regular rhythm.   Pulmonary/Chest: Effort normal and breath sounds normal.  Abd:  Soft, NT, +BS, ND Neurological: Pt is alert. Not confused  Skin: Skin is warm. No erythema.  Psychiatric: Pt behavior is normal.  Thought content normal.     Assessment & Plan:

## 2013-05-29 NOTE — Assessment & Plan Note (Signed)
Mild to mod, for antibx course,  to f/u any worsening symptoms or concerns 

## 2013-05-29 NOTE — Patient Instructions (Signed)
Please take all new medication as prescribed - the antibiotic, cough medicine, and medication for "abdominal spasms." Please continue all other medications as before, and refills have been done if requested. Please have the pharmacy call with any other refills you may need. Please call or return if you have ongoing blood, as you will need CXR or repeat CT scan  Please inquire (and send message to Dr Rhea Belton if necessary) to ask about the Breath Test recently to be sure the H Pylori is gone

## 2013-05-29 NOTE — Telephone Encounter (Signed)
lmom for pt to call back

## 2013-05-29 NOTE — Assessment & Plan Note (Signed)
Last CT neg for recurrence may 2014; for f/u cxr or CT for recurring hemoptysis  - declines now

## 2013-05-29 NOTE — Assessment & Plan Note (Signed)
stable overall by history and exam, recent data reviewed with pt, and pt to continue medical treatment as before,  to f/u any worsening symptoms or concerns Lab Results  Component Value Date   HGBA1C 5.9 03/19/2013    

## 2013-06-18 ENCOUNTER — Telehealth: Payer: Self-pay | Admitting: Internal Medicine

## 2013-06-19 NOTE — Telephone Encounter (Signed)
lmom for pt to call back

## 2013-06-20 NOTE — Telephone Encounter (Signed)
lmom for pt to call back

## 2013-06-20 NOTE — Telephone Encounter (Signed)
Pt left me a message stating he wanted the H. Pylori Breath Test results. Informed him they were -. We have no way of tracking them as of yet, so we wait until the pt calls back. We are working on a new system to track the tests because [facility] sends them for scanning. Pt stated understanding and requested a copy be sent to Dr Jenny Reichmann.

## 2013-07-02 ENCOUNTER — Other Ambulatory Visit (INDEPENDENT_AMBULATORY_CARE_PROVIDER_SITE_OTHER): Payer: Managed Care, Other (non HMO)

## 2013-07-02 ENCOUNTER — Ambulatory Visit (INDEPENDENT_AMBULATORY_CARE_PROVIDER_SITE_OTHER)
Admission: RE | Admit: 2013-07-02 | Discharge: 2013-07-02 | Disposition: A | Discharge status: Home / Self Care | Payer: Managed Care, Other (non HMO) | Source: Ambulatory Visit | Attending: Internal Medicine | Admitting: Internal Medicine

## 2013-07-02 ENCOUNTER — Encounter: Payer: Self-pay | Admitting: Internal Medicine

## 2013-07-02 ENCOUNTER — Ambulatory Visit (INDEPENDENT_AMBULATORY_CARE_PROVIDER_SITE_OTHER): Payer: Managed Care, Other (non HMO) | Admitting: Internal Medicine

## 2013-07-02 VITALS — BP 110/72 | HR 80 | Temp 98.2°F | Resp 16 | Ht 63.0 in | Wt 145.1 lb

## 2013-07-02 DIAGNOSIS — R05 Cough: Secondary | ICD-10-CM

## 2013-07-02 DIAGNOSIS — R059 Cough, unspecified: Secondary | ICD-10-CM

## 2013-07-02 DIAGNOSIS — Z8619 Personal history of other infectious and parasitic diseases: Secondary | ICD-10-CM

## 2013-07-02 DIAGNOSIS — C3490 Malignant neoplasm of unspecified part of unspecified bronchus or lung: Secondary | ICD-10-CM

## 2013-07-02 DIAGNOSIS — R109 Unspecified abdominal pain: Secondary | ICD-10-CM

## 2013-07-02 LAB — COMPREHENSIVE METABOLIC PANEL
ALBUMIN: 4.2 g/dL (ref 3.5–5.2)
ALT: 29 U/L (ref 0–53)
AST: 23 U/L (ref 0–37)
Alkaline Phosphatase: 79 U/L (ref 39–117)
BUN: 18 mg/dL (ref 6–23)
CALCIUM: 9.3 mg/dL (ref 8.4–10.5)
CHLORIDE: 102 meq/L (ref 96–112)
CO2: 31 mEq/L (ref 19–32)
CREATININE: 1 mg/dL (ref 0.4–1.5)
GFR: 83.4 mL/min (ref >60.00)
Glucose, Bld: 101 mg/dL — ABNORMAL HIGH (ref 70–99)
POTASSIUM: 4.2 meq/L (ref 3.5–5.1)
Sodium: 138 mEq/L (ref 135–145)
Total Bilirubin: 0.6 mg/dL (ref 0.3–1.2)
Total Protein: 7.6 g/dL (ref 6.0–8.3)

## 2013-07-02 LAB — URINALYSIS, ROUTINE W REFLEX MICROSCOPIC
BILIRUBIN URINE: NEGATIVE
Hgb urine dipstick: NEGATIVE
Ketones, ur: NEGATIVE
LEUKOCYTES UA: NEGATIVE
NITRITE: NEGATIVE
PH: 5.5 (ref 5.0–8.0)
RBC / HPF: NONE SEEN (ref 0–?)
Specific Gravity, Urine: >=1.03 — AB (ref 1.000–1.030)
Total Protein, Urine: NEGATIVE
Urine Glucose: NEGATIVE
Urobilinogen, UA: 0.2 (ref 0.0–1.0)

## 2013-07-02 LAB — AMYLASE: Amylase: 138 U/L — ABNORMAL HIGH (ref 27–131)

## 2013-07-02 LAB — CBC WITH DIFFERENTIAL/PLATELET
Basophils Absolute: 0 10*3/uL (ref 0.0–0.1)
Basophils Relative: 0.3 % (ref 0.0–3.0)
EOS ABS: 0 10*3/uL (ref 0.0–0.7)
EOS PCT: 0.9 % (ref 0.0–5.0)
HEMATOCRIT: 40.5 % (ref 39.0–52.0)
Hemoglobin: 13.8 g/dL (ref 13.0–17.0)
LYMPHS ABS: 2.1 10*3/uL (ref 0.7–4.0)
Lymphocytes Relative: 37.3 % (ref 12.0–46.0)
MCHC: 34.2 g/dL (ref 30.0–36.0)
MCV: 85.8 fl (ref 78.0–100.0)
MONO ABS: 0.3 10*3/uL (ref 0.1–1.0)
Monocytes Relative: 5.8 % (ref 3.0–12.0)
NEUTROS PCT: 55.7 % (ref 43.0–77.0)
Neutro Abs: 3.2 10*3/uL (ref 1.4–7.7)
PLATELETS: 257 10*3/uL (ref 150.0–400.0)
RBC: 4.72 Mil/uL (ref 4.22–5.81)
RDW: 12.5 % (ref 11.5–14.6)
WBC: 5.7 10*3/uL (ref 4.5–10.5)

## 2013-07-02 LAB — LIPASE: LIPASE: 23 U/L (ref 11.0–59.0)

## 2013-07-02 LAB — SEDIMENTATION RATE: SED RATE: 15 mm/h (ref 0–22)

## 2013-07-02 MED ORDER — HYDROCOD POLST-CHLORPHEN POLST 10-8 MG/5ML PO LQCR
5.0000 mL | Freq: Two times a day (BID) | ORAL | Status: DC | PRN
Start: 2013-07-02 — End: 2013-10-31

## 2013-07-02 NOTE — Patient Instructions (Signed)

## 2013-07-02 NOTE — Progress Notes (Signed)
Subjective:    Patient ID: Nicolas Cantu, male    DOB: 1965/10/26, 48 y.o.   MRN: [id number]  Cough This is a recurrent problem. The current episode started more than 1 month ago. The problem has been unchanged. The problem occurs every few hours. The cough is non-productive. Associated symptoms include shortness of breath, sweats and wheezing. Pertinent negatives include no chest pain, chills, ear congestion, ear pain, fever, headaches, heartburn, hemoptysis, myalgias, nasal congestion, postnasal drip, rash, rhinorrhea, sore throat or weight loss. Treatments tried: levaquin and hycodan. The treatment provided mild relief.      Review of Systems  Constitutional: Negative.  Negative for fever, chills, weight loss, diaphoresis, activity change, appetite change, fatigue and unexpected weight change.  HENT: Negative.  Negative for ear pain, postnasal drip, rhinorrhea and sore throat.   Eyes: Negative.   Respiratory: Positive for cough, shortness of breath and wheezing. Negative for apnea, hemoptysis, choking, chest tightness and stridor.   Cardiovascular: Negative.  Negative for chest pain, palpitations and leg swelling.  Gastrointestinal: Positive for abdominal pain and diarrhea. Negative for heartburn, nausea, vomiting, constipation, blood in stool, abdominal distention, anal bleeding and rectal pain.       Intermittent diarrhea and abd pain for one year  Endocrine: Negative.   Genitourinary: Negative.   Musculoskeletal: Negative.  Negative for arthralgias and myalgias.  Skin: Negative.  Negative for color change, pallor, rash and wound.  Allergic/Immunologic: Negative.   Neurological: Negative.  Negative for dizziness, tremors, weakness, light-headedness and headaches.  Hematological: Negative.  Negative for adenopathy. Does not bruise/bleed easily.  Psychiatric/Behavioral: Negative.        Objective:   Physical Exam  Vitals reviewed. Constitutional: He is oriented to person, place, and  time. He appears well-developed and well-nourished.  Non-toxic appearance. He does not have a sickly appearance. He does not appear ill. No distress.  HENT:  Head: Normocephalic and atraumatic.  Mouth/Throat: Oropharynx is clear and moist. No oropharyngeal exudate.  Eyes: Conjunctivae are normal. Right eye exhibits no discharge. Left eye exhibits no discharge. No scleral icterus.  Neck: Normal range of motion. Neck supple. No JVD present. No tracheal deviation present. No thyromegaly present.  Cardiovascular: Normal rate, regular rhythm, normal heart sounds and intact distal pulses.  Exam reveals no gallop and no friction rub.   No murmur heard. Pulmonary/Chest: Effort normal and breath sounds normal. No accessory muscle usage. Not tachypneic. No respiratory distress. He has no decreased breath sounds. He has no wheezes. He has no rhonchi. He has no rales. He exhibits no tenderness.  Abdominal: Soft. Bowel sounds are normal. He exhibits no distension and no mass. There is no tenderness. There is no rebound and no guarding.  Musculoskeletal: Normal range of motion. He exhibits no edema and no tenderness.  Lymphadenopathy:    He has no cervical adenopathy.  Neurological: He is oriented to person, place, and time.  Skin: Skin is warm and dry. No rash noted. He is not diaphoretic. No erythema. No pallor.  Psychiatric: He has a normal mood and affect. His behavior is normal. Judgment and thought content normal.     Lab Results  Component Value Date   WBC 5.8 03/19/2013   HGB 14.5 03/19/2013   HCT 43.1 03/19/2013   PLT 246.0 03/19/2013   GLUCOSE 104* 03/19/2013   CHOL 124 03/19/2013   TRIG 106.0 03/19/2013   HDL 52.00 03/19/2013   LDLDIRECT 131.7 03/16/2012   LDLCALC 51 03/19/2013   ALT 25 03/19/2013  AST 22 03/19/2013   NA 138 03/19/2013   K 4.4 03/19/2013   CL 102 03/19/2013   CREATININE 1.0 03/19/2013   BUN 14 03/19/2013   CO2 28 03/19/2013   TSH 0.97 03/19/2013   PSA 0.57  03/19/2013   INR 0.93 10/15/2011   HGBA1C 5.9 03/19/2013   MICROALBUR 0.5 03/16/2012       Assessment & Plan:

## 2013-07-02 NOTE — Progress Notes (Signed)
Pre visit review using our clinic review tool, if applicable. No additional management support is needed unless otherwise documented below in the visit note. 

## 2013-07-03 ENCOUNTER — Encounter: Payer: Self-pay | Admitting: Internal Medicine

## 2013-07-03 LAB — RPR

## 2013-07-03 NOTE — Assessment & Plan Note (Signed)
CXR today I neg for any evidence of recurrence

## 2013-07-03 NOTE — Assessment & Plan Note (Addendum)
He has cough wheezing and a normal CXR and exam today - this is most likely a recurrent cough after a URI, I have asked him to get PFT's done to see if there is asthma, I do not see any indication for further antibiotic therapy For now, I asked him to try tussionex for cough suppression

## 2013-07-03 NOTE — Assessment & Plan Note (Signed)
His exam is normal I will check his labs to look for metabolic causes

## 2013-07-03 NOTE — Assessment & Plan Note (Signed)
His RPR is negative now

## 2013-07-09 ENCOUNTER — Encounter: Payer: Self-pay | Admitting: Internal Medicine

## 2013-09-11 ENCOUNTER — Other Ambulatory Visit: Payer: Self-pay

## 2013-09-11 ENCOUNTER — Other Ambulatory Visit: Payer: Self-pay | Admitting: Internal Medicine

## 2013-09-11 DIAGNOSIS — C3490 Malignant neoplasm of unspecified part of unspecified bronchus or lung: Secondary | ICD-10-CM

## 2013-10-10 IMAGING — CR DG CHEST 1V PORT
1 series · 1 of 1 positions shown · non-contrast
Comparison: Chest x-ray of 10/15/2011 and CT chest of 07/05/2011

CLINICAL DATA: Postop left VATS and lobectomy

PORTABLE CHEST - 1 VIEW

[view not recorded]
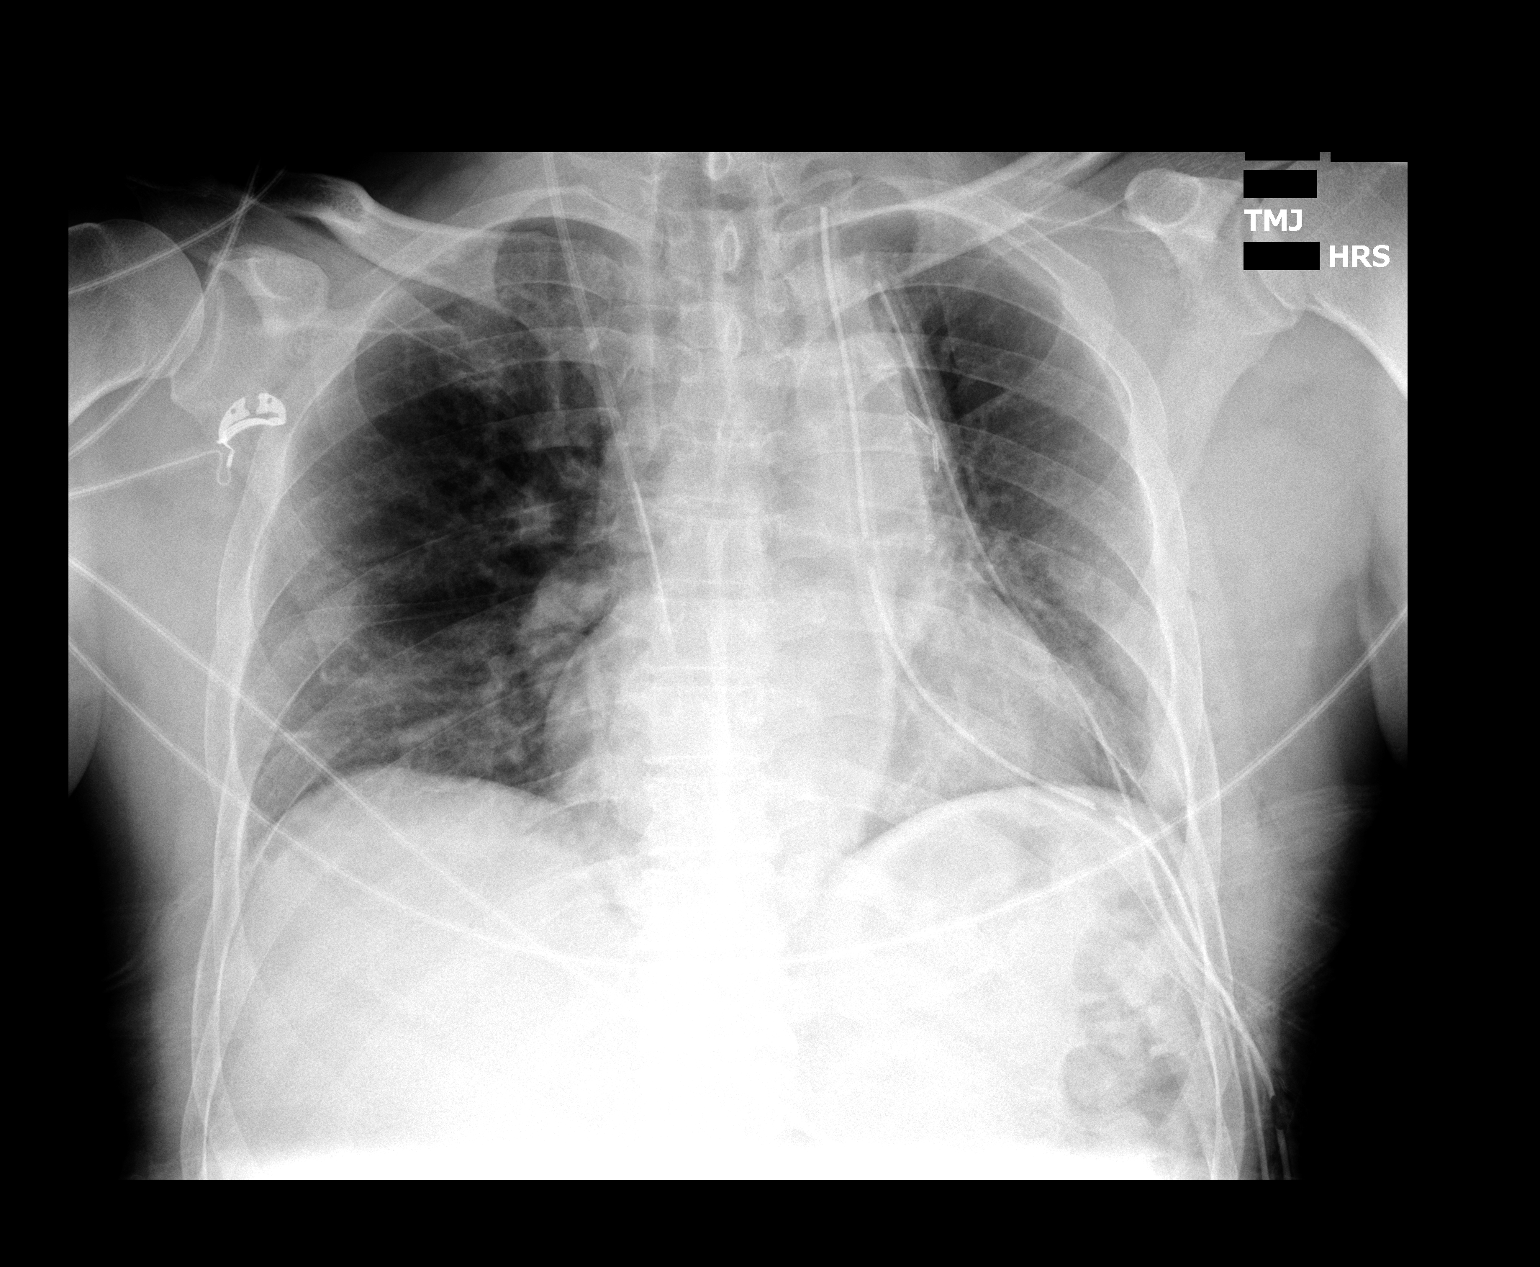

[1 of 1 positions shown; findings below may reference images not displayed]

FINDINGS: Two left chest tubes are present after resection of the
left lung nodule with some volume loss on the left.  Mild right
basilar atelectasis is present.  The right IJ central venous line
tip is in the lower SVC.  Cardiomegaly is stable.
IMPRESSION: 1.  Two left chest tubes present after left VATS and lobectomy for
resection of left lung nodule.
2.  Right IJ central venous line tip in lower SVC.

## 2013-10-12 IMAGING — CR DG CHEST 1V PORT
1 series · 1 of 1 positions shown · non-contrast
Comparison: Chest radiograph 10/20/2011

CLINICAL DATA: VATS

PORTABLE CHEST - 1 VIEW

[view not recorded]
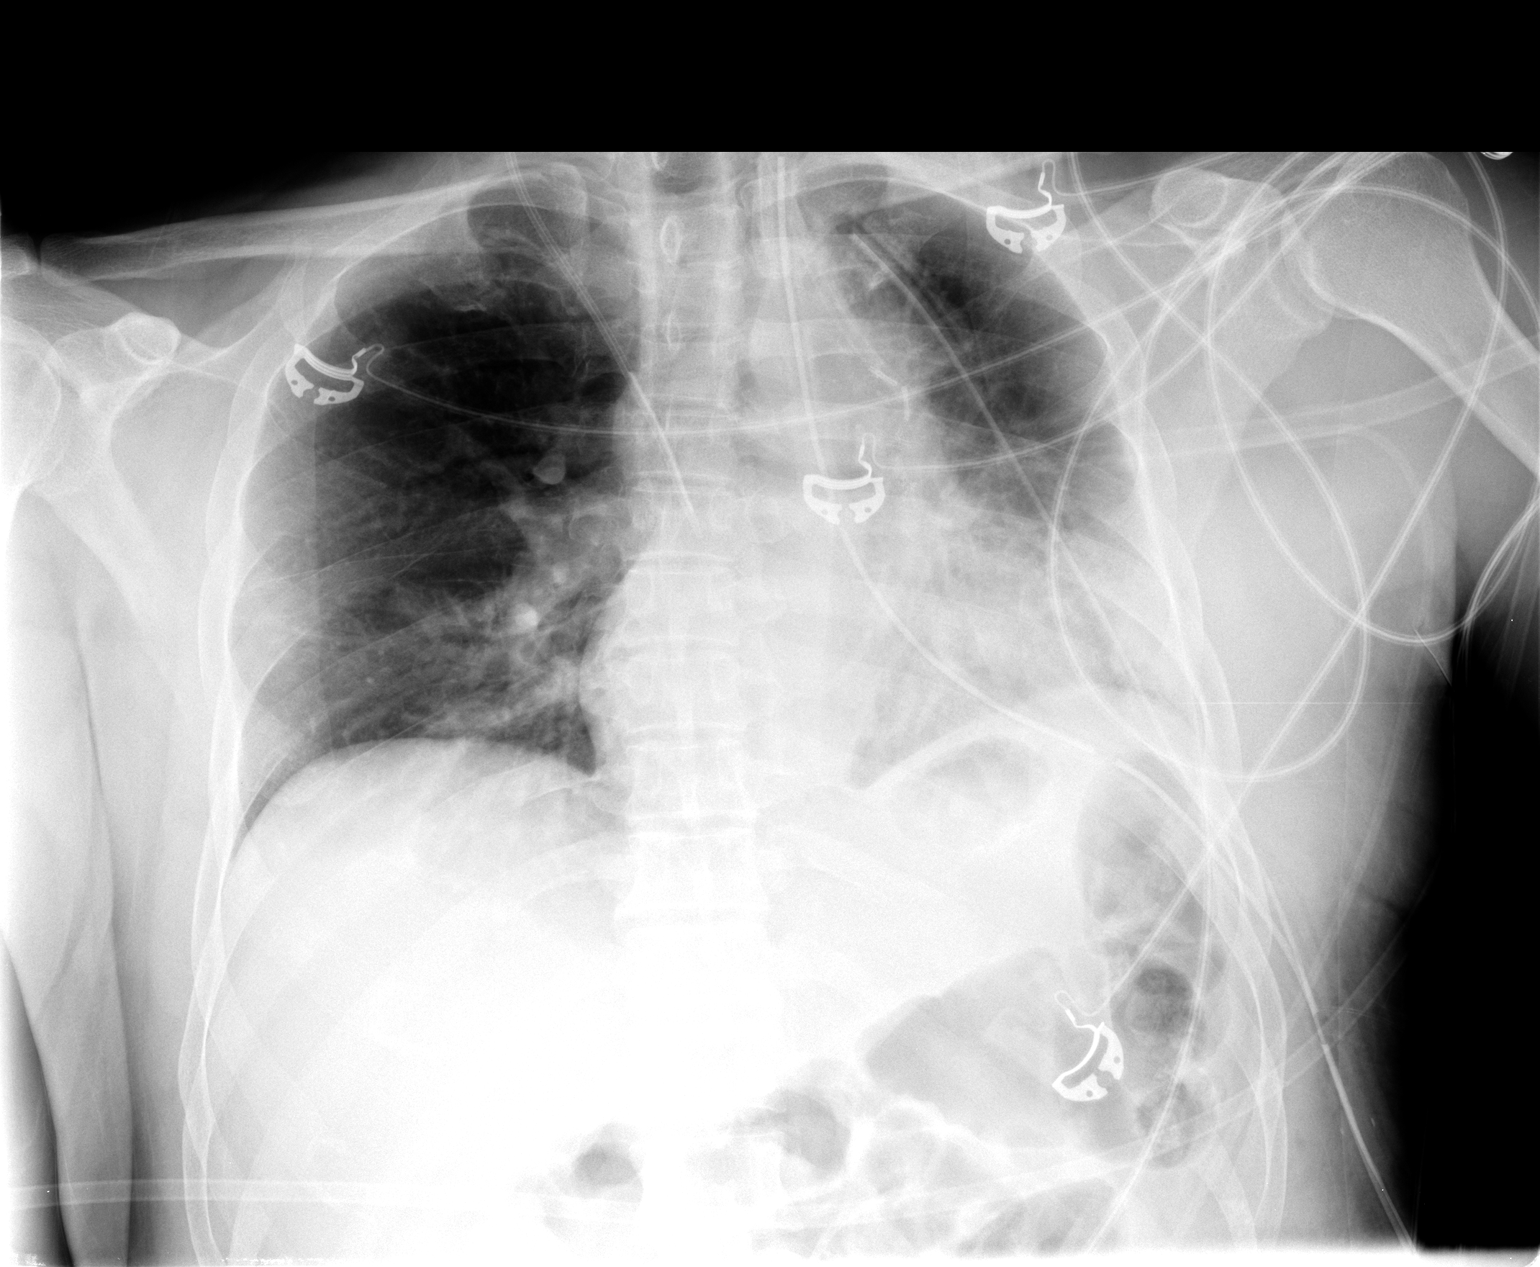

[1 of 1 positions shown; findings below may reference images not displayed]

FINDINGS: Right central venous line and tube left chest tubes are
unchanged.  No pneumothorax.  There is volume loss in the left
hemithorax with basilar atelectasis.  Right lung is clear.
IMPRESSION: 1.  Stable support apparatus.
2.  Postsurgical  change in the left hemithorax stable.

## 2013-10-13 IMAGING — CR DG CHEST 1V PORT
1 series · 1 of 1 positions shown · non-contrast
Comparison: 10/21/2011.

CLINICAL DATA: Follow-up chest tube.

PORTABLE CHEST - 1 VIEW

[AP]
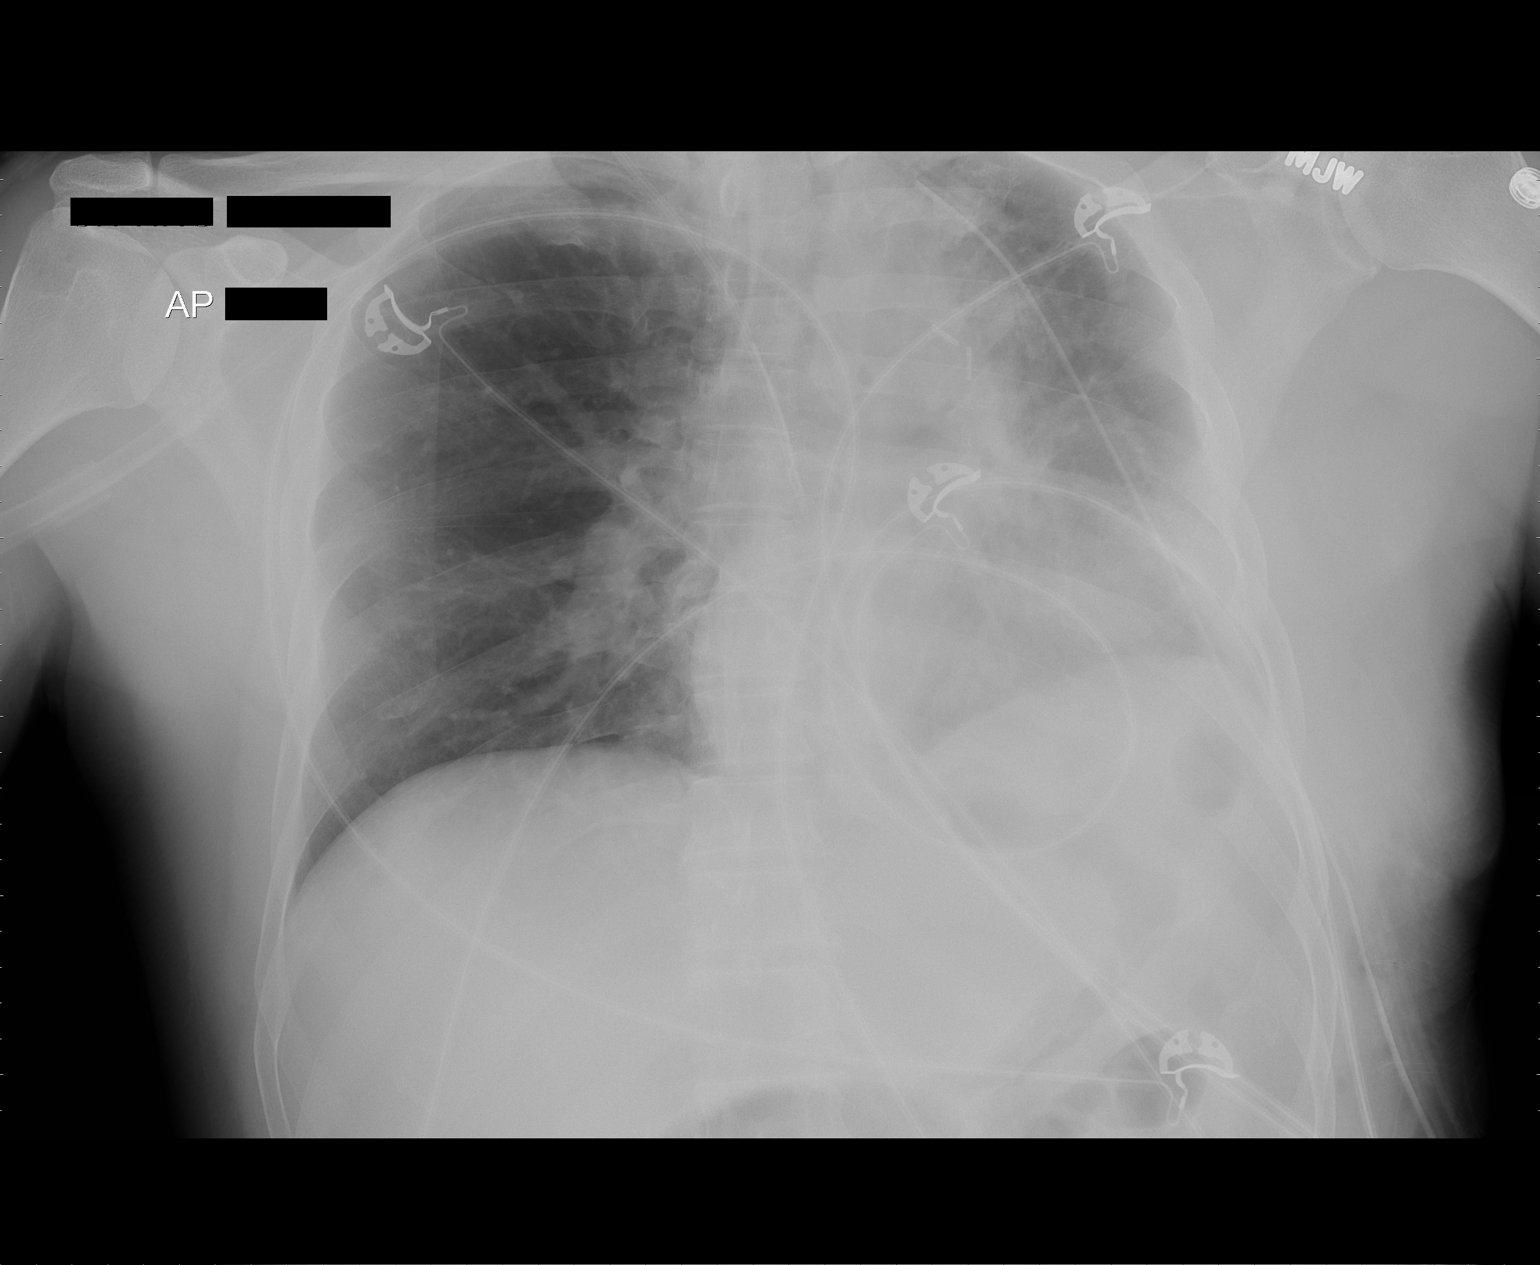

[1 of 1 positions shown; findings below may reference images not displayed]

FINDINGS: The more medial and superior left-sided chest tube has
been removed.  The other chest tube is stable.  No pneumothorax.
The right IJ catheter is stable.  Persistent low lung volumes with
vascular crowding and atelectasis.  Stable postoperative changes
involving the left lung.
IMPRESSION: 1.  Removal of one of the left-sided chest tube without
pneumothorax.
2.  Persistent low lung volumes with vascular crowding and
atelectasis.  Stable postoperative changes in the left lung.

## 2013-10-14 IMAGING — CR DG CHEST 2V
2 series · 2 of 2 positions shown · non-contrast
Comparison: 10/22/2011; 10/21/2011; 10/15/2011

CLINICAL DATA: Post lobectomy

CHEST - 2 VIEW

[w chest pa]
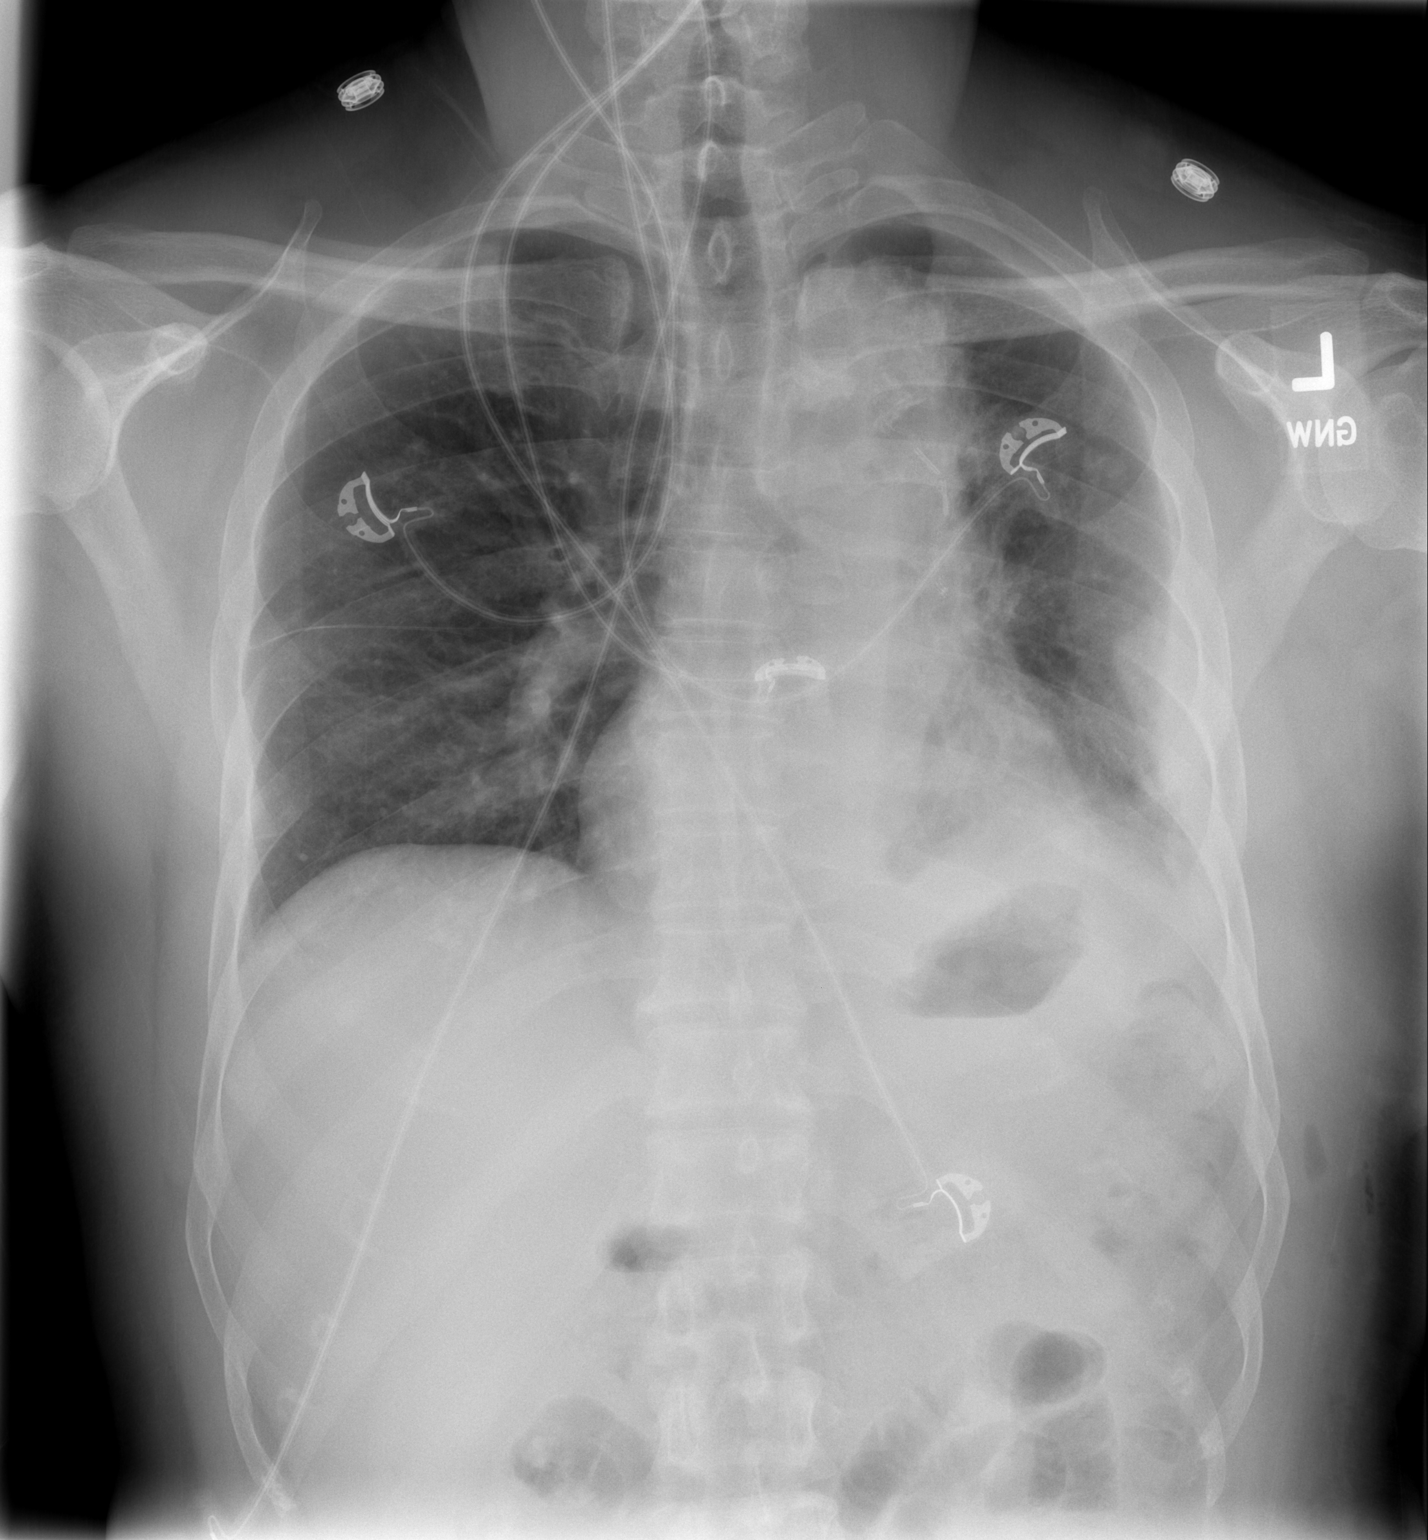

[w chest lat]
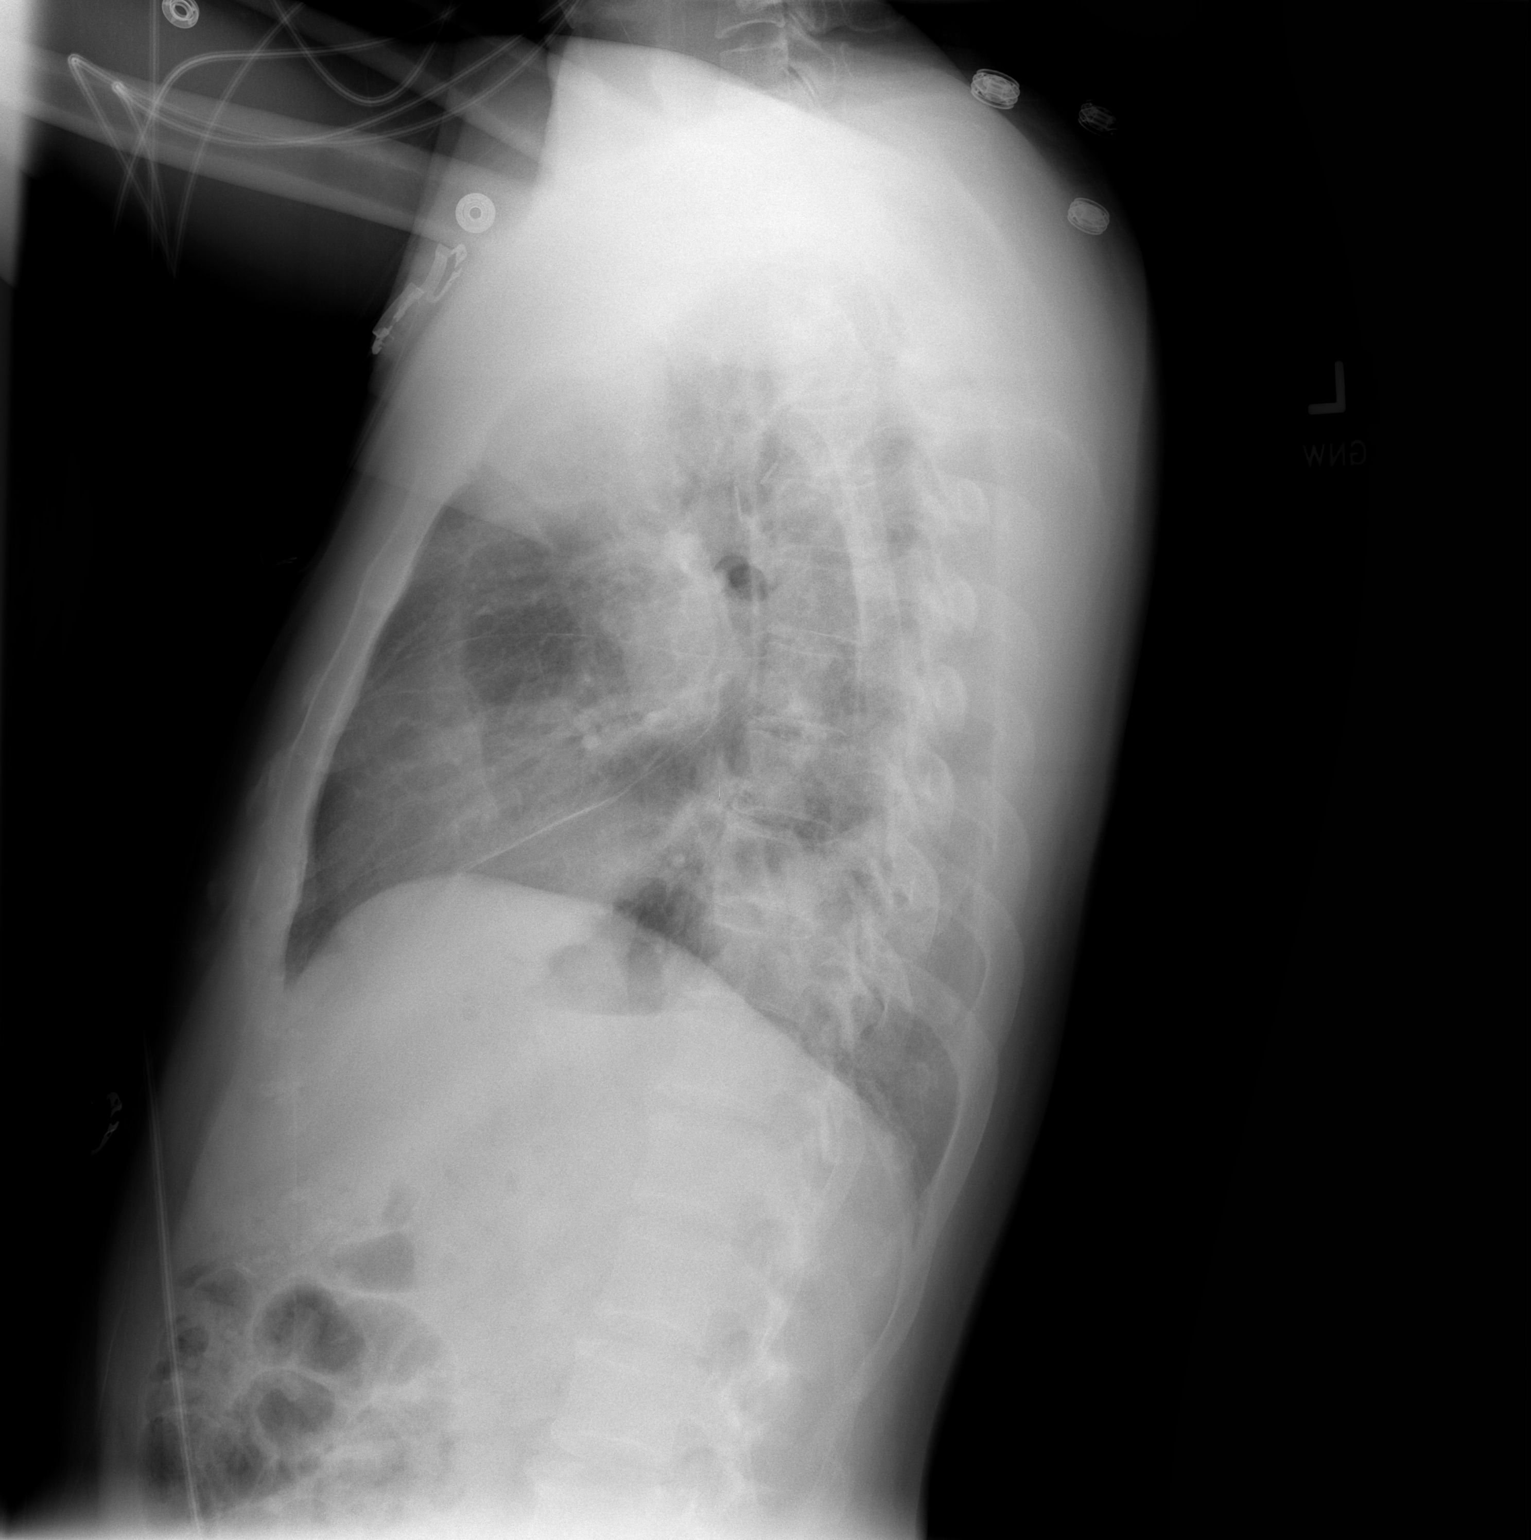

[2 of 2 positions shown; findings below may reference images not displayed]

FINDINGS: Grossly unchanged cardiac silhouette and mediastinal contours with
postsurgical change of the left hilum.  Interval removal of left-
sided chest tube.  Unchanged small left-sided effusion with fluid
layering along the left lung apex.  No definite pneumothorax.
Unchanged mild elevation of the left hemidiaphragm with left
basilar heterogeneous opacities.  The right hemithorax is
unchanged.  Unchanged bones.
IMPRESSION: 1.  Interval removal of a left-sided chest tube.  No definite
pneumothorax.
2.  Grossly unchanged left basilar heterogeneous opacities with
small amount of pleural fluid layering along the left lateral lung
and left lung apex.

3.  Stable postsurgical change of the right hilum.

## 2013-10-30 ENCOUNTER — Other Ambulatory Visit: Payer: Self-pay | Admitting: Cardiothoracic Surgery

## 2013-10-30 ENCOUNTER — Other Ambulatory Visit: Payer: Self-pay

## 2013-10-30 DIAGNOSIS — D381 Neoplasm of uncertain behavior of trachea, bronchus and lung: Secondary | ICD-10-CM

## 2013-10-30 LAB — CREATININE, SERUM: Creat: 1 mg/dL (ref 0.50–1.35)

## 2013-10-30 LAB — BUN: BUN: 12 mg/dL (ref 6–23)

## 2013-10-31 ENCOUNTER — Ambulatory Visit
Admission: RE | Admit: 2013-10-31 | Discharge: 2013-10-31 | Disposition: A | Discharge status: Home / Self Care | Payer: Managed Care, Other (non HMO) | Source: Ambulatory Visit | Attending: Cardiothoracic Surgery | Admitting: Cardiothoracic Surgery

## 2013-10-31 ENCOUNTER — Ambulatory Visit (INDEPENDENT_AMBULATORY_CARE_PROVIDER_SITE_OTHER): Payer: Managed Care, Other (non HMO) | Admitting: Cardiothoracic Surgery

## 2013-10-31 ENCOUNTER — Encounter: Payer: Self-pay | Admitting: Cardiothoracic Surgery

## 2013-10-31 VITALS — BP 132/86 | HR 82 | Resp 16 | Ht 63.0 in | Wt 155.0 lb

## 2013-10-31 DIAGNOSIS — C3410 Malignant neoplasm of upper lobe, unspecified bronchus or lung: Secondary | ICD-10-CM

## 2013-10-31 DIAGNOSIS — Z7189 Other specified counseling: Secondary | ICD-10-CM

## 2013-10-31 DIAGNOSIS — Z712 Person consulting for explanation of examination or test findings: Secondary | ICD-10-CM

## 2013-10-31 DIAGNOSIS — Z9889 Other specified postprocedural states: Secondary | ICD-10-CM

## 2013-10-31 DIAGNOSIS — Z902 Acquired absence of lung [part of]: Secondary | ICD-10-CM

## 2013-10-31 DIAGNOSIS — C3490 Malignant neoplasm of unspecified part of unspecified bronchus or lung: Secondary | ICD-10-CM

## 2013-10-31 MED ORDER — IOHEXOL 300 MG/ML  SOLN
75.0000 mL | Freq: Once | INTRAMUSCULAR | Status: AC | PRN
  Administered 2013-10-31: 75 mL via INTRAVENOUS

## 2013-10-31 NOTE — Progress Notes (Signed)
PCP is Cathlean Cower, MD Referring Provider is Clance, Armando Reichert, MD  Chief Complaint  Patient presents with  . Routine Post Op    1 yr f/u with CT CHEST...s/p LULobectomy 10/19/11    HPI: 2 year followup with CT scan after left upper lobectomy for adenocarcinoma, EGF R. mutation positive. Doing well. Nonsmoker. No pulmonary symptoms or weight loss.  CT of chest shows clear lung fields no adenopathy no evidence recurrent or new lung cancer.  Past Medical History  Diagnosis Date  . DIABETES MELLITUS, UNCONTROLLED 02/17/2009  . Diarrhea 02/17/2009  . HEMATOCHEZIA 02/17/2009  . HYPERLIPIDEMIA 02/17/2009  . LEG PAIN, BILATERAL 02/17/2009  . OSTEOARTHRITIS, KNEES, BILATERAL 05/14/2010  . Polyuria 03/24/2010  . Preventative health care 11/15/2010  . Allergic rhinitis, cause unspecified 02/26/2011  . Asthma 02/26/2011  . Shortness of breath   . Lung mass   . 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 Diabetes mellitus     ?  Marland Kitchen Hepatitis     hx  . Lung cancer 2013    LUL  . History of syphilis 08/24/2012    Remote tx  . Abdominal pain   . Colon polyp     Past Surgical History  Procedure Laterality Date  . Breath tek h pylori N/A 04/19/2013    Procedure: BREATH TEK Kandis Ban;  Surgeon: Jerene Bears, MD;  Location: [facility];  Service: Gastroenterology;  Laterality: N/A;    Family History  Problem Relation Age of Onset  . Colon cancer Neg Hx   . Esophageal cancer Neg Hx   . Rectal cancer Neg Hx   . Stomach cancer Neg Hx     Social History History  Substance Use Topics  . Smoking status: Never Smoker   . Smokeless tobacco: Never Used  . Alcohol Use: No     Comment: rare    Current Outpatient Prescriptions  Medication Sig Dispense Refill  . aspirin 81 MG EC tablet Take 81 mg by mouth daily.        Marland Kitchen atorvastatin (LIPITOR) 20 MG tablet take 1 tablet by mouth once daily  90 tablet  3   No current facility-administered medications for this visit.    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

## 2013-11-07 ENCOUNTER — Ambulatory Visit: Payer: Managed Care, Other (non HMO) | Admitting: Cardiothoracic Surgery

## 2013-11-07 ENCOUNTER — Other Ambulatory Visit: Payer: Managed Care, Other (non HMO)

## 2013-11-07 ENCOUNTER — Encounter: Payer: Self-pay | Admitting: *Deleted

## 2013-11-12 ENCOUNTER — Other Ambulatory Visit: Payer: Self-pay | Admitting: Internal Medicine

## 2013-11-13 ENCOUNTER — Other Ambulatory Visit: Payer: Self-pay | Admitting: Gastroenterology

## 2013-11-13 MED ORDER — DEXLANSOPRAZOLE 60 MG PO CPDR: 60.0000 mg | DELAYED_RELEASE_CAPSULE | Freq: Every day | ORAL | Status: DC

## 2013-12-31 ENCOUNTER — Encounter: Payer: Self-pay | Admitting: Internal Medicine

## 2014-02-07 ENCOUNTER — Encounter: Payer: Self-pay | Admitting: Internal Medicine

## 2014-02-07 ENCOUNTER — Other Ambulatory Visit (INDEPENDENT_AMBULATORY_CARE_PROVIDER_SITE_OTHER): Payer: Managed Care, Other (non HMO)

## 2014-02-07 ENCOUNTER — Ambulatory Visit (INDEPENDENT_AMBULATORY_CARE_PROVIDER_SITE_OTHER): Payer: Managed Care, Other (non HMO) | Admitting: Internal Medicine

## 2014-02-07 VITALS — BP 132/82 | HR 78 | Temp 98.5°F | Wt 153.2 lb

## 2014-02-07 DIAGNOSIS — R0609 Other forms of dyspnea: Secondary | ICD-10-CM

## 2014-02-07 DIAGNOSIS — R7309 Other abnormal glucose: Secondary | ICD-10-CM

## 2014-02-07 DIAGNOSIS — Z0000 Encounter for general adult medical examination without abnormal findings: Secondary | ICD-10-CM

## 2014-02-07 DIAGNOSIS — R7302 Impaired glucose tolerance (oral): Secondary | ICD-10-CM

## 2014-02-07 DIAGNOSIS — Z23 Encounter for immunization: Secondary | ICD-10-CM

## 2014-02-07 DIAGNOSIS — R0989 Other specified symptoms and signs involving the circulatory and respiratory systems: Secondary | ICD-10-CM

## 2014-02-07 DIAGNOSIS — R0600 Dyspnea, unspecified: Secondary | ICD-10-CM | POA: Insufficient documentation

## 2014-02-07 LAB — CBC WITH DIFFERENTIAL/PLATELET
BASOS ABS: 0 10*3/uL (ref 0.0–0.1)
BASOS PCT: 0.2 % (ref 0.0–3.0)
EOS ABS: 0.1 10*3/uL (ref 0.0–0.7)
Eosinophils Relative: 1.1 % (ref 0.0–5.0)
HCT: 43.5 % (ref 39.0–52.0)
HEMOGLOBIN: 14.8 g/dL (ref 13.0–17.0)
LYMPHS PCT: 39.8 % (ref 12.0–46.0)
Lymphs Abs: 2 10*3/uL (ref 0.7–4.0)
MCHC: 34.1 g/dL (ref 30.0–36.0)
MCV: 86.8 fl (ref 78.0–100.0)
MONO ABS: 0.3 10*3/uL (ref 0.1–1.0)
Monocytes Relative: 6.1 % (ref 3.0–12.0)
NEUTROS ABS: 2.6 10*3/uL (ref 1.4–7.7)
NEUTROS PCT: 52.8 % (ref 43.0–77.0)
Platelets: 235 10*3/uL (ref 150.0–400.0)
RBC: 5.01 Mil/uL (ref 4.22–5.81)
RDW: 12.2 % (ref 11.5–15.5)
WBC: 5 10*3/uL (ref 4.0–10.5)

## 2014-02-07 LAB — URINALYSIS, ROUTINE W REFLEX MICROSCOPIC
Bilirubin Urine: NEGATIVE
HGB URINE DIPSTICK: NEGATIVE
Ketones, ur: NEGATIVE
Leukocytes, UA: NEGATIVE
Nitrite: NEGATIVE
RBC / HPF: NONE SEEN (ref 0–?)
Specific Gravity, Urine: 1.015 (ref 1.000–1.030)
Total Protein, Urine: NEGATIVE
UROBILINOGEN UA: 0.2 (ref 0.0–1.0)
Urine Glucose: NEGATIVE
pH: 5.5 (ref 5.0–8.0)

## 2014-02-07 LAB — PSA: PSA: 0.41 ng/mL (ref 0.10–4.00)

## 2014-02-07 LAB — LIPID PANEL
CHOL/HDL RATIO: 5
Cholesterol: 266 mg/dL — ABNORMAL HIGH (ref 0–200)
HDL: 50.1 mg/dL (ref >39.00)
NONHDL: 215.9
Triglycerides: 271 mg/dL — ABNORMAL HIGH (ref 0.0–149.0)
VLDL: 54.2 mg/dL — ABNORMAL HIGH (ref 0.0–40.0)

## 2014-02-07 LAB — BASIC METABOLIC PANEL
BUN: 15 mg/dL (ref 6–23)
CO2: 28 mEq/L (ref 19–32)
Calcium: 9.6 mg/dL (ref 8.4–10.5)
Chloride: 100 mEq/L (ref 96–112)
Creatinine, Ser: 0.9 mg/dL (ref 0.4–1.5)
GFR: 92.65 mL/min (ref >60.00)
Glucose, Bld: 99 mg/dL (ref 70–99)
POTASSIUM: 3.7 meq/L (ref 3.5–5.1)
Sodium: 135 mEq/L (ref 135–145)

## 2014-02-07 LAB — HEPATIC FUNCTION PANEL
ALT: 31 U/L (ref 0–53)
AST: 25 U/L (ref 0–37)
Albumin: 4.2 g/dL (ref 3.5–5.2)
Alkaline Phosphatase: 75 U/L (ref 39–117)
BILIRUBIN DIRECT: 0.1 mg/dL (ref 0.0–0.3)
BILIRUBIN TOTAL: 1.2 mg/dL (ref 0.2–1.2)
Total Protein: 7.8 g/dL (ref 6.0–8.3)

## 2014-02-07 LAB — LDL CHOLESTEROL, DIRECT: LDL DIRECT: 160.5 mg/dL

## 2014-02-07 LAB — TSH: TSH: 0.48 u[IU]/mL (ref 0.35–4.50)

## 2014-02-07 MED ORDER — ALBUTEROL SULFATE HFA 108 (90 BASE) MCG/ACT IN AERS: 2.0000 | INHALATION_SPRAY | Freq: Four times a day (QID) | RESPIRATORY_TRACT | Status: DC | PRN

## 2014-02-07 NOTE — Progress Notes (Signed)
Pre visit review using our clinic review tool, if applicable. No additional management support is needed unless otherwise documented below in the visit note. 

## 2014-02-07 NOTE — Assessment & Plan Note (Signed)
Mild, nocturnal symptoms, ? Mild asthma - for albuterol mdi prn trial

## 2014-02-07 NOTE — Patient Instructions (Addendum)
You had the flu shot today  Please take all new medication as prescribed - the new inhaler to try for the shortness of breath episodes  Please continue all other medications as before, and refills have been done if requested.  Please have the pharmacy call with any other refills you may need.  Please continue your efforts at being more active, low cholesterol diet, and weight control.  You are otherwise up to date with prevention measures today.  Please keep your appointments with your specialists as you may have planned  Please go to the LAB in the Basement (turn left off the elevator) for the tests to be done today  You will be contacted by phone if any changes need to be made immediately.  Otherwise, you will receive a letter about your results with an explanation, but please check with MyChart first.  Please remember to sign up for MyChart if you have not done so, as this will be important to you in the future with finding out test results, communicating by private email, and scheduling acute appointments online when needed.  Please return in 1 year for your yearly visit, or sooner if needed

## 2014-02-07 NOTE — Assessment & Plan Note (Signed)
Asymtp, for a1c

## 2014-02-07 NOTE — Assessment & Plan Note (Signed)

## 2014-02-07 NOTE — Progress Notes (Signed)
Subjective:    Patient ID: Nicolas Cantu, male    DOB: January 11, 1966, 48 y.o.   MRN: [id number]  HPI  Here for wellness and f/u;  Overall doing ok;  Pt denies CP, worsening SOB, DOE, wheezing, orthopnea, PND, worsening LE edema, palpitations, dizziness or syncope.  Pt denies neurological change such as new headache, facial or extremity weakness.  Pt denies polydipsia, polyuria, or low sugar symptoms. Pt states overall good compliance with treatment and medications, good tolerability, and has been trying to follow lower cholesterol diet.  Pt denies worsening depressive symptoms, suicidal ideation or panic. No fever, night sweats, wt loss, loss of appetite, or other constitutional symptoms.  Pt states good ability with ADL's, has low fall risk, home safety reviewed and adequate, no other significant changes in hearing or vision, and only occasionally active with exercise.  Does have some nocturnal difficulty getting to sleep with sens of sob, hard to take deep breath, better to stand and go outside, mild but suggestive of asthma that his 2 daughters have.  Does visit [place] occasionally, but is very aware of risk of infections, recent RPR neg jan 2015, but asks for HIV testing though no specific exposure he knows in [place], just that it was very common in persons he knew. Past Medical History  Diagnosis Date  . DIABETES MELLITUS, UNCONTROLLED 02/17/2009  . Diarrhea 02/17/2009  . HEMATOCHEZIA 02/17/2009  . HYPERLIPIDEMIA 02/17/2009  . LEG PAIN, BILATERAL 02/17/2009  . OSTEOARTHRITIS, KNEES, BILATERAL 05/14/2010  . Polyuria 03/24/2010  . Preventative health care 11/15/2010  . Allergic rhinitis, cause unspecified 02/26/2011  . Asthma 02/26/2011  . Shortness of breath   . Lung mass   . 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 Diabetes mellitus     ?  Marland Kitchen Hepatitis     hx  . Lung cancer 2013    LUL  . History of syphilis 08/24/2012    Remote tx  . Abdominal pain   . Colon polyp    Past Surgical History    Procedure Laterality Date  . Breath tek h pylori N/A 04/19/2013    Procedure: BREATH TEK Kandis Ban;  Surgeon: Jerene Bears, MD;  Location: [facility];  Service: Gastroenterology;  Laterality: N/A;    reports that he has never smoked. He has never used smokeless tobacco. He reports that he does not drink alcohol or use illicit drugs. family history is negative for Colon cancer, Esophageal cancer, Rectal cancer, and Stomach cancer. No Known Allergies Current Outpatient Prescriptions on File Prior to Visit  Medication Sig Dispense Refill  . aspirin 81 MG EC tablet Take 81 mg by mouth daily.        Marland Kitchen atorvastatin (LIPITOR) 20 MG tablet take 1 tablet by mouth once daily  90 tablet  3  . dexlansoprazole (DEXILANT) 60 MG capsule Take 1 capsule (60 mg total) by mouth daily.  30 capsule  9   No current facility-administered medications on file prior to visit.    Review of Systems Constitutional: Negative for increased diaphoresis, other activity, appetite or other siginficant weight change  HENT: Negative for worsening hearing loss, ear pain, facial swelling, mouth sores and neck stiffness.   Eyes: Negative for other worsening pain, redness or visual disturbance.  Respiratory: Negative for shortness of breath and wheezing.   Cardiovascular: Negative for chest pain and palpitations.  Gastrointestinal: Negative for diarrhea, blood in stool, abdominal distention or other pain Genitourinary: Negative for hematuria, flank  pain or change in urine volume.  Musculoskeletal: Negative for myalgias or other joint complaints.  Skin: Negative for color change and wound.  Neurological: Negative for syncope and numbness. other than noted Hematological: Negative for adenopathy. or other swelling Psychiatric/Behavioral: Negative for hallucinations, self-injury, decreased concentration or other worsening agitation.      Objective:   Physical Exam BP 132/82  Pulse 78  Temp(Src) 98.5 F (36.9 C) (Oral)   Wt 153 lb 4 oz (69.514 kg)  SpO2 96% VS noted,  Constitutional: Pt is oriented to person, place, and time. Appears well-developed and well-nourished.  Head: Normocephalic and atraumatic.  Right Ear: External ear normal.  Left Ear: External ear normal.  Nose: Nose normal.  Mouth/Throat: Oropharynx is clear and moist.  Eyes: Conjunctivae and EOM are normal. Pupils are equal, round, and reactive to light.  Neck: Normal range of motion. Neck supple. No JVD present. No tracheal deviation present.  Cardiovascular: Normal rate, regular rhythm, normal heart sounds and intact distal pulses.   Pulmonary/Chest: Effort normal and breath sounds without rales or wheezing  Abdominal: Soft. Bowel sounds are normal. NT. No HSM  Musculoskeletal: Normal range of motion. Exhibits no edema.  Lymphadenopathy:  Has no cervical adenopathy.  Neurological: Pt is alert and oriented to person, place, and time. Pt has normal reflexes. No cranial nerve deficit. Motor grossly intact Skin: Skin is warm and dry. No rash noted.  Psychiatric:  Has normal mood and affect. Behavior is normal.     Assessment & Plan:

## 2014-02-08 ENCOUNTER — Telehealth: Payer: Self-pay | Admitting: Internal Medicine

## 2014-02-08 ENCOUNTER — Other Ambulatory Visit: Payer: Self-pay | Admitting: Internal Medicine

## 2014-02-08 LAB — HIV ANTIBODY (ROUTINE TESTING W REFLEX): HIV 1&2 Ab, 4th Generation: NONREACTIVE

## 2014-02-08 MED ORDER — ATORVASTATIN CALCIUM 40 MG PO TABS: 40.0000 mg | ORAL_TABLET | Freq: Every day | ORAL | Status: DC

## 2014-02-08 NOTE — Telephone Encounter (Signed)
Pt returned your call.  He says you can leave a detailed message on his vm if needed.

## 2014-02-08 NOTE — Telephone Encounter (Signed)
Spoke to the patient.

## 2014-02-14 ENCOUNTER — Telehealth: Payer: Self-pay | Admitting: Internal Medicine

## 2014-02-14 ENCOUNTER — Telehealth: Payer: Self-pay

## 2014-02-14 NOTE — Telephone Encounter (Signed)
Patient informed of results (left message patient informed ok to do) did mail a copy to his home.

## 2014-02-14 NOTE — Telephone Encounter (Signed)
Pt called in and wanted to speak to nurse about his lab results.  Only wanted to speak to nurse.  Best number [phone]  Thank you!

## 2014-02-14 NOTE — Telephone Encounter (Signed)
Called left message to call back 

## 2014-02-14 NOTE — Telephone Encounter (Signed)
The patient called requesting the results of his HIV test.

## 2014-02-14 NOTE — Telephone Encounter (Signed)
Sept 3 HIV negative, ok to send lab result, but I think all of his lab results are now on Mychart

## 2014-03-28 ENCOUNTER — Encounter: Payer: Managed Care, Other (non HMO) | Admitting: Internal Medicine

## 2014-05-15 ENCOUNTER — Encounter: Payer: Self-pay | Admitting: Internal Medicine

## 2014-05-15 ENCOUNTER — Ambulatory Visit (INDEPENDENT_AMBULATORY_CARE_PROVIDER_SITE_OTHER): Payer: Managed Care, Other (non HMO) | Admitting: Internal Medicine

## 2014-05-15 VITALS — BP 110/80 | HR 69 | Temp 98.2°F | Ht 63.0 in | Wt 155.0 lb

## 2014-05-15 DIAGNOSIS — R062 Wheezing: Secondary | ICD-10-CM

## 2014-05-15 DIAGNOSIS — J209 Acute bronchitis, unspecified: Secondary | ICD-10-CM | POA: Insufficient documentation

## 2014-05-15 DIAGNOSIS — R7302 Impaired glucose tolerance (oral): Secondary | ICD-10-CM

## 2014-05-15 DIAGNOSIS — J208 Acute bronchitis due to other specified organisms: Secondary | ICD-10-CM

## 2014-05-15 MED ORDER — HYDROCODONE-HOMATROPINE 5-1.5 MG/5ML PO SYRP: 5.0000 mL | ORAL_SOLUTION | Freq: Four times a day (QID) | ORAL | Status: DC | PRN

## 2014-05-15 MED ORDER — LEVOFLOXACIN 250 MG PO TABS: 250.0000 mg | ORAL_TABLET | Freq: Every day | ORAL | Status: DC

## 2014-05-15 MED ORDER — PREDNISONE 10 MG PO TABS: ORAL_TABLET | ORAL | Status: DC

## 2014-05-15 MED ORDER — METHYLPREDNISOLONE ACETATE 80 MG/ML IJ SUSP
80.0000 mg | Freq: Once | INTRAMUSCULAR | Status: AC
  Administered 2014-05-15: 80 mg via INTRAMUSCULAR

## 2014-05-15 NOTE — Patient Instructions (Signed)
You had the steroid shot today  Please take all new medication as prescribed - the antibiotic, cough medicine, and prednisone  Please continue all other medications as before, and refills have been done if requested.  Please have the pharmacy call with any other refills you may need.  Please continue your efforts at being more active, low cholesterol diet, and weight control.  Please keep your appointments with your specialists as you may have planned

## 2014-05-15 NOTE — Assessment & Plan Note (Signed)
Mild to mod, for antibx course,  to f/u any worsening symptoms or concerns 

## 2014-05-15 NOTE — Assessment & Plan Note (Signed)
Mild to mod, for depomedrol IM, predpac asd, to f/u any worsening symptoms or concerns 

## 2014-05-15 NOTE — Assessment & Plan Note (Signed)
stable overall by history and exam, recent data reviewed with pt, and pt to continue medical treatment as before,  to f/u any worsening symptoms or concerns Lab Results  Component Value Date   HGBA1C 5.9 03/19/2013

## 2014-05-15 NOTE — Progress Notes (Signed)
Subjective:    Patient ID: Nicolas Cantu, male    DOB: 27-Feb-1966, 48 y.o.   MRN: [id number]  HPI  Here with acute onset mild to mod 2-3 days ST, HA, general weakness and malaise, with prod cough greenish sputum, but Pt denies chest pain, increased sob or doe, wheezing, orthopnea, PND, increased LE swelling, palpitations, dizziness or syncope excpet for mild onset wheeizng and tightness x 24 hrs, could not sleep at night,. Inhaler helps somewhat.  Pt denies polydipsia, polyuria,  Past Medical History  Diagnosis Date  . DIABETES MELLITUS, UNCONTROLLED 02/17/2009  . Diarrhea 02/17/2009  . HEMATOCHEZIA 02/17/2009  . HYPERLIPIDEMIA 02/17/2009  . LEG PAIN, BILATERAL 02/17/2009  . OSTEOARTHRITIS, KNEES, BILATERAL 05/14/2010  . Polyuria 03/24/2010  . Preventative health care 11/15/2010  . Allergic rhinitis, cause unspecified 02/26/2011  . Asthma 02/26/2011  . Shortness of breath   . Lung mass   . 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 Diabetes mellitus     ?  Marland Kitchen Hepatitis     hx  . Lung cancer 2013    LUL  . History of syphilis 08/24/2012    Remote tx  . Abdominal pain   . Colon polyp    Past Surgical History  Procedure Laterality Date  . Breath tek h pylori N/A 04/19/2013    Procedure: BREATH TEK Kandis Ban;  Surgeon: Jerene Bears, MD;  Location: [facility];  Service: Gastroenterology;  Laterality: N/A;    reports that he has never smoked. He has never used smokeless tobacco. He reports that he does not drink alcohol or use illicit drugs. family history is negative for Colon cancer, Esophageal cancer, Rectal cancer, and Stomach cancer. No Known Allergies Current Outpatient Prescriptions on File Prior to Visit  Medication Sig Dispense Refill  . albuterol (PROVENTIL HFA;VENTOLIN HFA) 108 (90 BASE) MCG/ACT inhaler Inhale 2 puffs into the lungs every 6 (six) hours as needed for wheezing or shortness of breath. 1 Inhaler 5  . aspirin 81 MG EC tablet Take 81 mg by mouth daily.      Marland Kitchen atorvastatin  (LIPITOR) 40 MG tablet Take 1 tablet (40 mg total) by mouth daily. 90 tablet 3  . dexlansoprazole (DEXILANT) 60 MG capsule Take 1 capsule (60 mg total) by mouth daily. 30 capsule 9   No current facility-administered medications on file prior to visit.    Review of Systems  Constitutional: Negative for unusual diaphoresis or other sweats  HENT: Negative for ringing in ear Eyes: Negative for double vision or worsening visual disturbance.  Respiratory: Negative for choking and stridor.   Gastrointestinal: Negative for vomiting or other signifcant bowel change Genitourinary: Negative for hematuria or decreased urine volume.  Musculoskeletal: Negative for other MSK pain or swelling Skin: Negative for color change and worsening wound.  Neurological: Negative for tremors and numbness other than noted  Psychiatric/Behavioral: Negative for decreased concentration or agitation other than above  ]    Objective:   Physical Exam BP 110/80 mmHg  Pulse 69  Temp(Src) 98.2 F (36.8 C) (Oral)  Ht 5\' 3"  (1.6 m)  Wt 155 lb (70.308 kg)  BMI 27.46 kg/m2  SpO2 98% VS noted, mild ill Constitutional: Pt appears well-developed, well-nourished.  HENT: Head: NCAT.  Right Ear: External ear normal.  Left Ear: External ear normal.  Bilat tm's with mild erythema.  Max sinus areas non tender.  Pharynx with mild erythema, no exudate Eyes: . Pupils are equal, round, and reactive  to light. Conjunctivae and EOM are normal Neck: Normal range of motion. Neck supple.  Cardiovascular: Normal rate and regular rhythm.   Pulmonary/Chest: Effort normal and breath sounds decreased with few wheeze bases.  Neurological: Pt is alert. Not confused , motor grossly intact Skin: Skin is warm. No rash Psychiatric: Pt behavior is normal. No agitation.      Assessment & Plan:

## 2014-05-15 NOTE — Progress Notes (Signed)
Pre visit review using our clinic review tool, if applicable. No additional management support is needed unless otherwise documented below in the visit note. 

## 2014-05-23 ENCOUNTER — Telehealth: Payer: Self-pay | Admitting: Internal Medicine

## 2014-05-23 MED ORDER — HYDROCOD POLST-CHLORPHEN POLST 10-8 MG/5ML PO LQCR: 5.0000 mL | Freq: Two times a day (BID) | ORAL | Status: DC | PRN

## 2014-05-23 NOTE — Telephone Encounter (Signed)
Patient states he was given cough syrup with codine.  He states this is not working.  He is requesting tussin cough syrup.

## 2014-05-23 NOTE — Telephone Encounter (Signed)
Called the patient left a message to pickup prescription for cough at the front desk.

## 2014-05-23 NOTE — Telephone Encounter (Signed)
Probably means he wants tussionex - Done hardcopy to [employer]

## 2014-07-10 ENCOUNTER — Encounter: Payer: Self-pay | Admitting: Internal Medicine

## 2014-07-10 ENCOUNTER — Ambulatory Visit (INDEPENDENT_AMBULATORY_CARE_PROVIDER_SITE_OTHER): Payer: Managed Care, Other (non HMO) | Admitting: Internal Medicine

## 2014-07-10 ENCOUNTER — Other Ambulatory Visit (INDEPENDENT_AMBULATORY_CARE_PROVIDER_SITE_OTHER): Payer: Managed Care, Other (non HMO)

## 2014-07-10 ENCOUNTER — Ambulatory Visit (INDEPENDENT_AMBULATORY_CARE_PROVIDER_SITE_OTHER)
Admission: RE | Admit: 2014-07-10 | Discharge: 2014-07-10 | Disposition: A | Discharge status: Home / Self Care | Payer: Managed Care, Other (non HMO) | Source: Ambulatory Visit | Attending: Internal Medicine | Admitting: Internal Medicine

## 2014-07-10 VITALS — BP 118/76 | HR 94 | Temp 98.9°F | Ht 63.0 in | Wt 154.0 lb

## 2014-07-10 DIAGNOSIS — R0609 Other forms of dyspnea: Secondary | ICD-10-CM

## 2014-07-10 DIAGNOSIS — R011 Cardiac murmur, unspecified: Secondary | ICD-10-CM | POA: Insufficient documentation

## 2014-07-10 DIAGNOSIS — R7302 Impaired glucose tolerance (oral): Secondary | ICD-10-CM

## 2014-07-10 LAB — CBC WITH DIFFERENTIAL/PLATELET
BASOS ABS: 0 10*3/uL (ref 0.0–0.1)
Basophils Relative: 0.4 % (ref 0.0–3.0)
EOS PCT: 1.4 % (ref 0.0–5.0)
Eosinophils Absolute: 0.1 10*3/uL (ref 0.0–0.7)
HCT: 42.6 % (ref 39.0–52.0)
Hemoglobin: 14.4 g/dL (ref 13.0–17.0)
LYMPHS PCT: 36.9 % (ref 12.0–46.0)
Lymphs Abs: 2.3 10*3/uL (ref 0.7–4.0)
MCHC: 33.7 g/dL (ref 30.0–36.0)
MCV: 86.4 fl (ref 78.0–100.0)
Monocytes Absolute: 0.4 10*3/uL (ref 0.1–1.0)
Monocytes Relative: 6.1 % (ref 3.0–12.0)
NEUTROS PCT: 55.2 % (ref 43.0–77.0)
Neutro Abs: 3.5 10*3/uL (ref 1.4–7.7)
PLATELETS: 246 10*3/uL (ref 150.0–400.0)
RBC: 4.93 Mil/uL (ref 4.22–5.81)
RDW: 12.7 % (ref 11.5–15.5)
WBC: 6.3 10*3/uL (ref 4.0–10.5)

## 2014-07-10 MED ORDER — PREDNISONE 10 MG PO TABS: ORAL_TABLET | ORAL | Status: DC

## 2014-07-10 MED ORDER — ATORVASTATIN CALCIUM 40 MG PO TABS: 40.0000 mg | ORAL_TABLET | Freq: Every day | ORAL | Status: DC

## 2014-07-10 MED ORDER — ALBUTEROL SULFATE HFA 108 (90 BASE) MCG/ACT IN AERS: 2.0000 | INHALATION_SPRAY | Freq: Four times a day (QID) | RESPIRATORY_TRACT | Status: DC | PRN

## 2014-07-10 NOTE — Progress Notes (Signed)
Subjective:    Patient ID: Nicolas Cantu, male    DOB: February 14, 1966, 49 y.o.   MRN: [id number]  HPI  49 yo M with hx of Lung ca s/p Left upper lobectomy; Here with 1-2 wk gradually worsening doe and fatigue; normally quite active and can do 25 min running in treadmill most days of the week; but in the last few days not able to complete this with doe and fatigue/weakness;  Has some mild occas non prod cough and sense of not getting enough air/hard to take deep breaths;, overall symptoms worse with running, as well as bending at the waist where the abd pushes up into the chest, and sleeps better on side now, cannot sleep well on his back as before due to sob and waking him up.   Pt denies fever, wt loss, night sweats, loss of appetite, or other constitutional symptoms  No prior hx of same.  Pt denies chest pain, orthopnea, PND, increased LE swelling, palpitations, dizziness or syncope.  No hx of heart murmur per pt. No recent labs/cxr/echo/ecg.  Did have Ct Chest w/ CM may 2015 - FINDINGS: The patient has had a previous left upper lobectomy. There are no infiltrates or effusions. There are no lung masses. There is no hilar or mediastinal adenopathy. Heart size normal. No osseous abnormality. The visualized portion of the upper abdomen is normal except for 6 mm low-density lesion in the lower pole of the kidney consistent with a cyst.  IMPRESSION: No acute abnormalities. No evidence of recurrent tumor or other new pulmonary abnormalities. Past Medical History  Diagnosis Date  . DIABETES MELLITUS, UNCONTROLLED 02/17/2009  . Diarrhea 02/17/2009  . HEMATOCHEZIA 02/17/2009  . HYPERLIPIDEMIA 02/17/2009  . LEG PAIN, BILATERAL 02/17/2009  . OSTEOARTHRITIS, KNEES, BILATERAL 05/14/2010  . Polyuria 03/24/2010  . Preventative health care 11/15/2010  . Allergic rhinitis, cause unspecified 02/26/2011  . Asthma 02/26/2011  . Shortness of breath   . Lung mass   . 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 Diabetes mellitus      ?  Marland Kitchen Hepatitis     hx  . Lung cancer 2013    LUL  . History of syphilis 08/24/2012    Remote tx  . Abdominal pain   . Colon polyp    Past Surgical History  Procedure Laterality Date  . Breath tek h pylori N/A 04/19/2013    Procedure: BREATH TEK Kandis Ban;  Surgeon: Jerene Bears, MD;  Location: [facility];  Service: Gastroenterology;  Laterality: N/A;    reports that he has never smoked. He has never used smokeless tobacco. He reports that he does not drink alcohol or use illicit drugs. family history is negative for Colon cancer, Esophageal cancer, Rectal cancer, and Stomach cancer. No Known Allergies Current Outpatient Prescriptions on File Prior to Visit  Medication Sig Dispense Refill  . albuterol (PROVENTIL HFA;VENTOLIN HFA) 108 (90 BASE) MCG/ACT inhaler Inhale 2 puffs into the lungs every 6 (six) hours as needed for wheezing or shortness of breath. 1 Inhaler 5  . aspirin 81 MG EC tablet Take 81 mg by mouth daily.      . chlorpheniramine-HYDROcodone (TUSSIONEX PENNKINETIC ER) 10-8 MG/5ML LQCR Take 5 mLs by mouth every 12 (twelve) hours as needed for cough. 115 mL 0  . dexlansoprazole (DEXILANT) 60 MG capsule Take 1 capsule (60 mg total) by mouth daily. 30 capsule 9  . predniSONE (DELTASONE) 10 MG tablet 3 tabs by mouth per day  for 3 days,2tabs per day for 3 days,1tab per day for 3 days 18 tablet 0   No current facility-administered medications on file prior to visit.   Review of Systems  Constitutional: Negative for unusual diaphoresis or other sweats  HENT: Negative for ringing in ear Eyes: Negative for double vision or worsening visual disturbance.  Respiratory: Negative for choking and stridor.   Gastrointestinal: Negative for vomiting or other signifcant bowel change Genitourinary: Negative for hematuria or decreased urine volume.  Musculoskeletal: Negative for other MSK pain or swelling Skin: Negative for color change and worsening wound.  Neurological: Negative for  tremors and numbness other than noted  Psychiatric/Behavioral: Negative for decreased concentration or agitation other than above       Objective:   Physical Exam BP 118/76 mmHg  Pulse 94  Temp(Src) 98.9 F (37.2 C) (Oral)  Ht 5\' 3"  (1.6 m)  Wt 154 lb (69.854 kg)  BMI 27.29 kg/m2  SpO2 95% VS noted, non toxic in NAD at rest Constitutional: Pt appears well-developed, well-nourished.  HENT: Head: NCAT.  Right Ear: External ear normal.  Left Ear: External ear normal.  Eyes: . Pupils are equal, round, and reactive to light. Conjunctivae and EOM are normal Neck: Normal range of motion. Neck supple.  Cardiovascular: Normal rate and regular rhythm.  with sys murmur RUSB gr 2/6 Pulmonary/Chest: Effort normal and breath sounds somewhat decreased but without rales or wheezing.  Abd:  Soft, NT, ND, + BS Neurological: Pt is alert. Not confused , motor grossly intact Skin: Skin is warm. No rash Psychiatric: Pt behavior is normal. No agitation.   Last cxr on record: CLINICAL DATA: Cough, shortness of breath and history of lung cancer with prior left upper lobectomy.  EXAM: CHEST - 2 VIEW  COMPARISON: CT CHEST W/O CM dated 10/18/2012; DG CHEST 2 VIEW dated 02/23/2012  FINDINGS: Stable postoperative volume loss of the left lung following prior upper lobectomy. There is no evidence of pulmonary edema, consolidation, pneumothorax, nodule or pleural fluid. Visualized bony structures are unremarkable.  IMPRESSION: No active disease.   Electronically Signed  By: Aletta Edouard M.D.  On: 07/02/2013 20:27     Assessment & Plan:

## 2014-07-10 NOTE — Progress Notes (Signed)
Pre visit review using our clinic review tool, if applicable. No additional management support is needed unless otherwise documented below in the visit note. 

## 2014-07-10 NOTE — Patient Instructions (Signed)
Your EKG was Ok today  You have done the chest xray and blood work which is now pending  Please take all new medication as prescribed - the inhaler refill, as well as a trial of prednisone  Please continue all other medications as before  Please have the pharmacy call with any other refills you may need.  Please keep your appointments with your specialists as you may have planned  You will be contacted regarding the referral for: echocardiogram   We will need to consider referral to pulmonary or cardiology depending on the results  Please return in 2 weeks if you are not referred to specialist

## 2014-07-10 NOTE — Assessment & Plan Note (Addendum)
ECG reviewed as per emr, etiology unclear, differential includes bronchospasm possibly viral related vs recurrent lung ca vs other, for inhaler re-start, predpac asd but will also need f/u cxr with hx of lung ca; does not appear to have volume overload, also for cbc and labs

## 2014-07-11 LAB — HEPATIC FUNCTION PANEL
ALT: 40 U/L (ref 0–53)
AST: 53 U/L — ABNORMAL HIGH (ref 0–37)
Albumin: 4.4 g/dL (ref 3.5–5.2)
Alkaline Phosphatase: 79 U/L (ref 39–117)
BILIRUBIN DIRECT: 0 mg/dL (ref 0.0–0.3)
Total Bilirubin: 0.4 mg/dL (ref 0.2–1.2)
Total Protein: 7.5 g/dL (ref 6.0–8.3)

## 2014-07-11 LAB — BASIC METABOLIC PANEL
BUN: 21 mg/dL (ref 6–23)
CALCIUM: 9.6 mg/dL (ref 8.4–10.5)
CO2: 26 meq/L (ref 19–32)
Chloride: 102 mEq/L (ref 96–112)
Creatinine, Ser: 1.1 mg/dL (ref 0.40–1.50)
GFR: 75.26 mL/min (ref >60.00)
GLUCOSE: 129 mg/dL — AB (ref 70–99)
POTASSIUM: 3.9 meq/L (ref 3.5–5.1)
Sodium: 138 mEq/L (ref 135–145)

## 2014-07-11 NOTE — Assessment & Plan Note (Signed)
Asympt, for f/u with onset symptoms polys or cbg > 200 with steroid tx

## 2014-07-11 NOTE — Assessment & Plan Note (Signed)
?   Unclear significance, but will need echocardiogram for further eval

## 2014-07-19 ENCOUNTER — Telehealth: Payer: Self-pay | Admitting: Internal Medicine

## 2014-07-19 NOTE — Telephone Encounter (Signed)
All labs and cxr were negative for acute problems, no need for change of tx based on this  Message for all was left on MyChart when testing was done

## 2014-07-19 NOTE — Telephone Encounter (Signed)
Pt called and wanted to know the results of xrays and labs.

## 2014-07-22 NOTE — Telephone Encounter (Signed)
Patient called back.  Gave message.

## 2014-07-29 ENCOUNTER — Other Ambulatory Visit (HOSPITAL_COMMUNITY): Payer: Managed Care, Other (non HMO)

## 2014-07-31 ENCOUNTER — Ambulatory Visit (HOSPITAL_COMMUNITY): Payer: Managed Care, Other (non HMO) | Attending: Internal Medicine

## 2014-07-31 DIAGNOSIS — R0609 Other forms of dyspnea: Secondary | ICD-10-CM

## 2014-07-31 DIAGNOSIS — R0600 Dyspnea, unspecified: Secondary | ICD-10-CM | POA: Insufficient documentation

## 2014-07-31 DIAGNOSIS — E785 Hyperlipidemia, unspecified: Secondary | ICD-10-CM | POA: Diagnosis not present

## 2014-07-31 DIAGNOSIS — R011 Cardiac murmur, unspecified: Secondary | ICD-10-CM | POA: Insufficient documentation

## 2014-07-31 NOTE — Progress Notes (Signed)
2D Echo completed. 07/31/2014

## 2014-09-27 ENCOUNTER — Other Ambulatory Visit: Payer: Self-pay | Admitting: *Deleted

## 2014-09-27 DIAGNOSIS — C3490 Malignant neoplasm of unspecified part of unspecified bronchus or lung: Secondary | ICD-10-CM

## 2014-10-08 ENCOUNTER — Ambulatory Visit (INDEPENDENT_AMBULATORY_CARE_PROVIDER_SITE_OTHER): Payer: Managed Care, Other (non HMO) | Admitting: Internal Medicine

## 2014-10-08 VITALS — BP 128/80 | HR 90 | Temp 98.7°F | Resp 18 | Ht 63.0 in | Wt 146.1 lb

## 2014-10-08 DIAGNOSIS — R05 Cough: Secondary | ICD-10-CM | POA: Diagnosis not present

## 2014-10-08 DIAGNOSIS — R062 Wheezing: Secondary | ICD-10-CM | POA: Diagnosis not present

## 2014-10-08 DIAGNOSIS — R7302 Impaired glucose tolerance (oral): Secondary | ICD-10-CM | POA: Diagnosis not present

## 2014-10-08 DIAGNOSIS — R059 Cough, unspecified: Secondary | ICD-10-CM | POA: Insufficient documentation

## 2014-10-08 MED ORDER — HYDROCOD POLST-CPM POLST ER 10-8 MG/5ML PO SUER: 5.0000 mL | Freq: Two times a day (BID) | ORAL | Status: DC | PRN

## 2014-10-08 MED ORDER — PREDNISONE 10 MG PO TABS: ORAL_TABLET | ORAL | Status: DC

## 2014-10-08 MED ORDER — LEVOFLOXACIN 500 MG PO TABS: 500.0000 mg | ORAL_TABLET | Freq: Every day | ORAL | Status: DC

## 2014-10-08 NOTE — Patient Instructions (Signed)
Please take all new medication as prescribed - the antibiotic, cough medicine, and prednisone ° °Please continue all other medications as before, and refills have been done if requested. ° °Please have the pharmacy call with any other refills you may need. ° °Please keep your appointments with your specialists as you may have planned ° ° ° °

## 2014-10-08 NOTE — Progress Notes (Signed)
Subjective:    Patient ID: Nicolas Cantu, male    DOB: [birth year], 49 y.o.   MRN: [id number]  HPI  Here with acute onset mild to mod 2-3 days ST, HA, general weakness and malaise, with prod cough greenish sputum, but Pt denies chest pain, increased sob or doe, wheezing, orthopnea, PND, increased LE swelling, palpitations, dizziness or syncope, except for mild wheezing and tightness since last PM with sob.  Pt denies polydipsia, polyuria,  Past Medical History  Diagnosis Date  . DIABETES MELLITUS, UNCONTROLLED 02/17/2009  . Diarrhea 02/17/2009  . HEMATOCHEZIA 02/17/2009  . HYPERLIPIDEMIA 02/17/2009  . LEG PAIN, BILATERAL 02/17/2009  . OSTEOARTHRITIS, KNEES, BILATERAL 05/14/2010  . Polyuria 03/24/2010  . Preventative health care 11/15/2010  . Allergic rhinitis, cause unspecified 02/26/2011  . Asthma 02/26/2011  . Shortness of breath   . Lung mass   . 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 Diabetes mellitus     ?  Marland Kitchen Hepatitis     hx  . Lung cancer 2013    LUL  . History of syphilis 08/24/2012    Remote tx  . Abdominal pain   . Colon polyp    Past Surgical History  Procedure Laterality Date  . Breath tek h pylori N/A 04/19/2013    Procedure: BREATH TEK Kandis Ban;  Surgeon: Jerene Bears, MD;  Location: [facility];  Service: Gastroenterology;  Laterality: N/A;    reports that he has never smoked. He has never used smokeless tobacco. He reports that he does not drink alcohol or use illicit drugs. family history is negative for Colon cancer, Esophageal cancer, Rectal cancer, and Stomach cancer. No Known Allergies Current Outpatient Prescriptions on File Prior to Visit  Medication Sig Dispense Refill  . albuterol (PROVENTIL HFA;VENTOLIN HFA) 108 (90 BASE) MCG/ACT inhaler Inhale 2 puffs into the lungs every 6 (six) hours as needed for wheezing or shortness of breath. 1 Inhaler 5  . aspirin 81 MG EC tablet Take 81 mg by mouth daily.      Marland Kitchen atorvastatin (LIPITOR) 40 MG tablet Take 1 tablet (40 mg  total) by mouth daily. 90 tablet 3  . dexlansoprazole (DEXILANT) 60 MG capsule Take 1 capsule (60 mg total) by mouth daily. 30 capsule 9   No current facility-administered medications on file prior to visit.   Review of Systems  Constitutional: Negative for unusual diaphoresis or night sweats HENT: Negative for ringing in ear or discharge Eyes: Negative for double vision or worsening visual disturbance.  Respiratory: Negative for choking and stridor.   Gastrointestinal: Negative for vomiting or other signifcant bowel change Genitourinary: Negative for hematuria or change in urine volume.  Musculoskeletal: Negative for other MSK pain or swelling Skin: Negative for color change and worsening wound.  Neurological: Negative for tremors and numbness other than noted  Psychiatric/Behavioral: Negative for decreased concentration or agitation other than above       Objective:   Physical Exam BP 128/80 mmHg  Pulse 90  Temp(Src) 98.7 F (37.1 C) (Oral)  Resp 18  Ht '5\' 3"'$  (1.6 m)  Wt 146 lb 1.9 oz (66.28 kg)  BMI 25.89 kg/m2  SpO2 98% VS noted, mild ill Constitutional: Pt appears in no significant distress HENT: Head: NCAT.  Right Ear: External ear normal.  Left Ear: External ear normal.  Eyes: . Pupils are equal, round, and reactive to light. Conjunctivae and EOM are normal Bilat tm's with mild erythema.  Max sinus areas none  tender.  Pharynx with mild erythema, no exudate Neck: Normal range of motion. Neck supple.  Cardiovascular: Normal rate and regular rhythm.   Pulmonary/Chest: Effort normal and breath sounds without rales but with mild wheeze Neurological: Pt is alert. Not confused , motor grossly intact Skin: Skin is warm. No rash, no LE edema Psychiatric: Pt behavior is normal. No agitation.      Assessment & Plan:

## 2014-10-09 ENCOUNTER — Ambulatory Visit: Payer: Self-pay | Admitting: Internal Medicine

## 2014-10-09 DIAGNOSIS — R062 Wheezing: Secondary | ICD-10-CM | POA: Insufficient documentation

## 2014-10-09 NOTE — Assessment & Plan Note (Signed)
Mild , for predpac course,  to f/u any worsening symptoms or concerns

## 2014-10-09 NOTE — Assessment & Plan Note (Signed)
C/w bronchitis, cant r/p pna, declines cxr, Mild to mod, for antibx course,  to f/u any worsening symptoms or concerns

## 2014-10-09 NOTE — Assessment & Plan Note (Signed)
stable overall by history and exam, recent data reviewed with pt, and pt to continue medical treatment as before,  to f/u any worsening symptoms or concerns Lab Results  Component Value Date   HGBA1C 5.9 03/19/2013   Pt to call for onset polys or sugar > 200 with steroid tx

## 2014-10-29 ENCOUNTER — Other Ambulatory Visit: Payer: Self-pay | Admitting: Cardiothoracic Surgery

## 2014-10-30 ENCOUNTER — Ambulatory Visit
Admission: RE | Admit: 2014-10-30 | Discharge: 2014-10-30 | Disposition: A | Discharge status: Home / Self Care | Payer: Managed Care, Other (non HMO) | Source: Ambulatory Visit | Attending: Cardiothoracic Surgery | Admitting: Cardiothoracic Surgery

## 2014-10-30 ENCOUNTER — Encounter: Payer: Self-pay | Admitting: Cardiothoracic Surgery

## 2014-10-30 ENCOUNTER — Ambulatory Visit (INDEPENDENT_AMBULATORY_CARE_PROVIDER_SITE_OTHER): Payer: Managed Care, Other (non HMO) | Admitting: Cardiothoracic Surgery

## 2014-10-30 VITALS — BP 123/80 | HR 83 | Resp 20 | Ht 63.0 in | Wt 146.0 lb

## 2014-10-30 DIAGNOSIS — Z85118 Personal history of other malignant neoplasm of bronchus and lung: Secondary | ICD-10-CM

## 2014-10-30 DIAGNOSIS — Z9889 Other specified postprocedural states: Secondary | ICD-10-CM

## 2014-10-30 DIAGNOSIS — Z902 Acquired absence of lung [part of]: Secondary | ICD-10-CM

## 2014-10-30 DIAGNOSIS — C3490 Malignant neoplasm of unspecified part of unspecified bronchus or lung: Secondary | ICD-10-CM

## 2014-10-30 MED ORDER — IOPAMIDOL (ISOVUE-300) INJECTION 61%
75.0000 mL | Freq: Once | INTRAVENOUS | Status: AC | PRN
  Administered 2014-10-30: 75 mL via INTRAVENOUS

## 2014-10-30 NOTE — Progress Notes (Signed)
PCP is Cathlean Cower, MD Referring Provider is Clance, Armando Reichert, MD  Chief Complaint  Patient presents with  . Follow-up    1 year f/u with Chest CT    HPI: Patient returns with CT scan for 3 year followup status post left upper lobectomy for stage I non-small cell carcinoma lung. Patient continues to be a nonsmoker. He denies recurrent cough weight loss or chest pain.  The patient has noted some shortness of breath orthopnea and a 2-D echo cardiogram was normal EF no valvular disease. The patient may have symptoms of GERD.  CT scan of the chest shows no significant lung disease, no evidence recurrent or new lung cancer. No evidence of heart failure or pleural effusion.  Past Medical History  Diagnosis Date  . DIABETES MELLITUS, UNCONTROLLED 02/17/2009  . Diarrhea 02/17/2009  . HEMATOCHEZIA 02/17/2009  . HYPERLIPIDEMIA 02/17/2009  . LEG PAIN, BILATERAL 02/17/2009  . OSTEOARTHRITIS, KNEES, BILATERAL 05/14/2010  . Polyuria 03/24/2010  . Preventative health care 11/15/2010  . Allergic rhinitis, cause unspecified 02/26/2011  . Asthma 02/26/2011  . Shortness of breath   . Lung mass   . 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 Diabetes mellitus     ?  Marland Kitchen Hepatitis     hx  . Lung cancer 2013    LUL  . History of syphilis 08/24/2012    Remote tx  . Abdominal pain   . Colon polyp     Past Surgical History  Procedure Laterality Date  . Breath tek h pylori N/A 04/19/2013    Procedure: BREATH TEK Kandis Ban;  Surgeon: Jerene Bears, MD;  Location: [facility];  Service: Gastroenterology;  Laterality: N/A;    Family History  Problem Relation Age of Onset  . Colon cancer Neg Hx   . Esophageal cancer Neg Hx   . Rectal cancer Neg Hx   . Stomach cancer Neg Hx     Social History History  Substance Use Topics  . Smoking status: Never Smoker   . Smokeless tobacco: Never Used  . Alcohol Use: No     Comment: rare    Current Outpatient Prescriptions  Medication Sig Dispense Refill  .  albuterol (PROVENTIL HFA;VENTOLIN HFA) 108 (90 BASE) MCG/ACT inhaler Inhale 2 puffs into the lungs every 6 (six) hours as needed for wheezing or shortness of breath. 1 Inhaler 5  . aspirin 81 MG EC tablet Take 81 mg by mouth daily.      Marland Kitchen atorvastatin (LIPITOR) 40 MG tablet Take 1 tablet (40 mg total) by mouth daily. 90 tablet 3  . chlorpheniramine-HYDROcodone (TUSSIONEX PENNKINETIC ER) 10-8 MG/5ML SUER Take 5 mLs by mouth every 12 (twelve) hours as needed for cough. 180 mL 0  . dexlansoprazole (DEXILANT) 60 MG capsule Take 1 capsule (60 mg total) by mouth daily. 30 capsule 9   No current facility-administered medications for this visit.    No Known Allergies  Review of Systems   Review of Systems  General:  No weight loss   no fever   no decreased energy  no night sweats Cardiac:-  Chest pain with exertion- resting chest pain  - SOB with exertion  - Orthopnea                -  PND  -ankle edema-  syncope Pulmonary:  no dyspnea,no cough, no productive cough no home oxygen no hemoptysis GI: no difficulty swallowing  no GERD no jaundice  no melena  no hematemesis no        abdominal pain GU:  no dysuria  no hematuria  no frequent UTI no BPH Vascular:  no claudication  No TIA  No varicose veins no DVT Neuro:  no sroke no seizures no TIA no head trauma no vision changes Musculoskeletal:  normal mobility no arthritis  no gout  no joint swelling Skin: no rash  no skin ulceration  no skin cancer Endocrine: diabetes  no thyroid didease Hematologic: no easy bruising  no blood transfusions  no frequent epistaxis ENT : no painful teeth no dentures no loose teeth Psych : no anxiety  no depression  o psych hospitalizations        BP 123/80 mmHg  Pulse 83  Resp 20  Ht '5\' 3"'$  (1.6 m)  Wt 146 lb (66.225 kg)  BMI 25.87 kg/m2  SpO2 98% Physical Exam     Physical Exam  General: middle-aged patient male no acute distress HEENT: Normocephalic pupils equal , dentition adequate Neck: Supple  without JVD, adenopathy, or bruit Chest: Clear to auscultation, symmetrical breath sounds, no rhonchi, no tenderness             or deformity Cardiovascular: Regular rate and rhythm, no murmur, no gallop, peripheral pulses             palpable in all extremities Abdomen:  Soft, nontender, no palpable mass or organomegaly Extremities: Warm, well-perfused, no clubbing cyanosis edema or tenderness,              no venous stasis changes of the legs Rectal/GU: Deferred Neuro: Grossly non--focal and symmetrical throughout Skin: Clean and dry without rash or ulceration   Diagnostic Tests: CT scan images personally reviewed and discussed in detail the patient  Impression: No evidence of recurrent non-small cell carcinoma Patient concern regarding cough and some shortness of breath when lying supine-probably related to GERD Patient concern regarding shortness of breath when he runs. Echocardiogram normal. Denies associated chest pain. May need cardiac stress test to be decided by his primary physician if symptoms persist or worsen.  Plan:return for CT scan of chest in one year to finish post  stop surveillance of recurrent lung cancer   Len Childs, MD [facility] [phone]

## 2015-02-06 ENCOUNTER — Telehealth: Payer: Self-pay | Admitting: Internal Medicine

## 2015-02-06 DIAGNOSIS — Z0000 Encounter for general adult medical examination without abnormal findings: Secondary | ICD-10-CM

## 2015-02-06 NOTE — Telephone Encounter (Signed)
Pt has a cpe next week and would like to do his labs before his appt.  Can the orders be put in ?

## 2015-02-06 NOTE — Telephone Encounter (Signed)
Labs ordered, can you inform pt?

## 2015-02-07 ENCOUNTER — Other Ambulatory Visit (INDEPENDENT_AMBULATORY_CARE_PROVIDER_SITE_OTHER): Payer: Managed Care, Other (non HMO)

## 2015-02-07 DIAGNOSIS — Z0000 Encounter for general adult medical examination without abnormal findings: Secondary | ICD-10-CM

## 2015-02-07 LAB — URINALYSIS, ROUTINE W REFLEX MICROSCOPIC
Bilirubin Urine: NEGATIVE
Hgb urine dipstick: NEGATIVE
Ketones, ur: NEGATIVE
Leukocytes, UA: NEGATIVE
NITRITE: NEGATIVE
RBC / HPF: NONE SEEN (ref 0–?)
Specific Gravity, Urine: 1.025 (ref 1.000–1.030)
TOTAL PROTEIN, URINE-UPE24: NEGATIVE
Urine Glucose: NEGATIVE
Urobilinogen, UA: 0.2 (ref 0.0–1.0)
WBC, UA: NONE SEEN (ref 0–?)
pH: 5.5 (ref 5.0–8.0)

## 2015-02-07 LAB — BASIC METABOLIC PANEL
BUN: 14 mg/dL (ref 6–23)
CALCIUM: 9.2 mg/dL (ref 8.4–10.5)
CO2: 29 mEq/L (ref 19–32)
Chloride: 102 mEq/L (ref 96–112)
Creatinine, Ser: 0.87 mg/dL (ref 0.40–1.50)
GFR: 98.42 mL/min (ref >60.00)
Glucose, Bld: 100 mg/dL — ABNORMAL HIGH (ref 70–99)
Potassium: 3.9 mEq/L (ref 3.5–5.1)
SODIUM: 139 meq/L (ref 135–145)

## 2015-02-07 LAB — CBC WITH DIFFERENTIAL/PLATELET
Basophils Absolute: 0 10*3/uL (ref 0.0–0.1)
Basophils Relative: 0.4 % (ref 0.0–3.0)
Eosinophils Absolute: 0.1 10*3/uL (ref 0.0–0.7)
Eosinophils Relative: 1.4 % (ref 0.0–5.0)
HEMATOCRIT: 41 % (ref 39.0–52.0)
HEMOGLOBIN: 14 g/dL (ref 13.0–17.0)
LYMPHS ABS: 2.2 10*3/uL (ref 0.7–4.0)
Lymphocytes Relative: 46.1 % — ABNORMAL HIGH (ref 12.0–46.0)
MCHC: 34.2 g/dL (ref 30.0–36.0)
MCV: 86.4 fl (ref 78.0–100.0)
MONOS PCT: 6.8 % (ref 3.0–12.0)
Monocytes Absolute: 0.3 10*3/uL (ref 0.1–1.0)
Neutro Abs: 2.2 10*3/uL (ref 1.4–7.7)
Neutrophils Relative %: 45.3 % (ref 43.0–77.0)
Platelets: 237 10*3/uL (ref 150.0–400.0)
RBC: 4.75 Mil/uL (ref 4.22–5.81)
RDW: 12.3 % (ref 11.5–15.5)
WBC: 4.8 10*3/uL (ref 4.0–10.5)

## 2015-02-07 LAB — HEPATIC FUNCTION PANEL
ALBUMIN: 4.3 g/dL (ref 3.5–5.2)
ALT: 23 U/L (ref 0–53)
AST: 19 U/L (ref 0–37)
Alkaline Phosphatase: 80 U/L (ref 39–117)
Bilirubin, Direct: 0.1 mg/dL (ref 0.0–0.3)
TOTAL PROTEIN: 7.3 g/dL (ref 6.0–8.3)
Total Bilirubin: 0.8 mg/dL (ref 0.2–1.2)

## 2015-02-07 LAB — LIPID PANEL
Cholesterol: 208 mg/dL — ABNORMAL HIGH (ref 0–200)
HDL: 50.8 mg/dL (ref >39.00)
LDL Cholesterol: 127 mg/dL — ABNORMAL HIGH (ref 0–99)
NonHDL: 156.9
Total CHOL/HDL Ratio: 4
Triglycerides: 148 mg/dL (ref 0.0–149.0)
VLDL: 29.6 mg/dL (ref 0.0–40.0)

## 2015-02-07 LAB — TSH: TSH: 0.98 u[IU]/mL (ref 0.35–4.50)

## 2015-02-11 ENCOUNTER — Ambulatory Visit (INDEPENDENT_AMBULATORY_CARE_PROVIDER_SITE_OTHER): Payer: Managed Care, Other (non HMO) | Admitting: Internal Medicine

## 2015-02-11 ENCOUNTER — Encounter: Payer: Self-pay | Admitting: Internal Medicine

## 2015-02-11 ENCOUNTER — Other Ambulatory Visit (INDEPENDENT_AMBULATORY_CARE_PROVIDER_SITE_OTHER): Payer: Managed Care, Other (non HMO)

## 2015-02-11 VITALS — BP 118/76 | HR 74 | Temp 97.8°F | Ht 63.0 in | Wt 149.0 lb

## 2015-02-11 DIAGNOSIS — Z23 Encounter for immunization: Secondary | ICD-10-CM

## 2015-02-11 DIAGNOSIS — Z0000 Encounter for general adult medical examination without abnormal findings: Secondary | ICD-10-CM

## 2015-02-11 DIAGNOSIS — R7302 Impaired glucose tolerance (oral): Secondary | ICD-10-CM | POA: Diagnosis not present

## 2015-02-11 LAB — PSA: PSA: 0.64 ng/mL (ref 0.10–4.00)

## 2015-02-11 LAB — HEPATITIS C ANTIBODY: HCV AB: NEGATIVE

## 2015-02-11 LAB — HEMOGLOBIN A1C: HEMOGLOBIN A1C: 6.1 % (ref 4.6–6.5)

## 2015-02-11 MED ORDER — ATORVASTATIN CALCIUM 40 MG PO TABS: 40.0000 mg | ORAL_TABLET | Freq: Every day | ORAL | Status: DC

## 2015-02-11 NOTE — Addendum Note (Signed)
Addended by: Lyman Bishop on: 02/11/2015 09:39 AM   Modules accepted: Orders

## 2015-02-11 NOTE — Assessment & Plan Note (Signed)
Also for a1c, but suspect may be normal

## 2015-02-11 NOTE — Progress Notes (Signed)
Pre visit review using our clinic review tool, if applicable. No additional management support is needed unless otherwise documented below in the visit note. 

## 2015-02-11 NOTE — Assessment & Plan Note (Signed)

## 2015-02-11 NOTE — Progress Notes (Signed)
Subjective:    Patient ID: Nicolas Cantu, male    DOB: [birth year], 49 y.o.   MRN: [id number]  HPI  Here for wellness and f/u;  Overall doing ok;  Pt denies Chest pain, worsening SOB, DOE, wheezing, orthopnea, PND, worsening LE edema, palpitations, dizziness or syncope.  Pt denies neurological change such as new headache, facial or extremity weakness.  Pt denies polydipsia, polyuria, or low sugar symptoms. Pt states overall good compliance with treatment and medications, good tolerability, and has been trying to follow appropriate diet.  Pt denies worsening depressive symptoms, suicidal ideation or panic. No fever, night sweats, wt loss, loss of appetite, or other constitutional symptoms.  Pt states good ability with ADL's, has low fall risk, home safety reviewed and adequate, no other significant changes in hearing or vision, and only occasionally active with exercise.  No new complaints. Has f/u with oncology in 1 yr, then likely to be released if no recurrent lung ca.  Did not get psa and hep c testing with labs.  Past Medical History  Diagnosis Date  . DIABETES MELLITUS, UNCONTROLLED 02/17/2009  . Diarrhea 02/17/2009  . HEMATOCHEZIA 02/17/2009  . HYPERLIPIDEMIA 02/17/2009  . LEG PAIN, BILATERAL 02/17/2009  . OSTEOARTHRITIS, KNEES, BILATERAL 05/14/2010  . Polyuria 03/24/2010  . Preventative health care 11/15/2010  . Allergic rhinitis, cause unspecified 02/26/2011  . Asthma 02/26/2011  . Shortness of breath   . Lung mass   . 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 Diabetes mellitus     ?  Marland Kitchen Hepatitis     hx  . Lung cancer 2013    LUL  . History of syphilis 08/24/2012    Remote tx  . Abdominal pain   . Colon polyp    Past Surgical History  Procedure Laterality Date  . Breath tek h pylori N/A 04/19/2013    Procedure: BREATH TEK Kandis Ban;  Surgeon: Jerene Bears, MD;  Location: [facility];  Service: Gastroenterology;  Laterality: N/A;    reports that he has never smoked. He has never used  smokeless tobacco. He reports that he does not drink alcohol or use illicit drugs. family history is negative for Colon cancer, Esophageal cancer, Rectal cancer, and Stomach cancer. No Known Allergies Current Outpatient Prescriptions on File Prior to Visit  Medication Sig Dispense Refill  . albuterol (PROVENTIL HFA;VENTOLIN HFA) 108 (90 BASE) MCG/ACT inhaler Inhale 2 puffs into the lungs every 6 (six) hours as needed for wheezing or shortness of breath. 1 Inhaler 5  . aspirin 81 MG EC tablet Take 81 mg by mouth daily.      . chlorpheniramine-HYDROcodone (TUSSIONEX PENNKINETIC ER) 10-8 MG/5ML SUER Take 5 mLs by mouth every 12 (twelve) hours as needed for cough. (Patient not taking: Reported on 02/11/2015) 180 mL 0  . dexlansoprazole (DEXILANT) 60 MG capsule Take 1 capsule (60 mg total) by mouth daily. (Patient not taking: Reported on 02/11/2015) 30 capsule 9   No current facility-administered medications on file prior to visit.   Review of Systems Constitutional: Negative for increased diaphoresis, other activity, appetite or siginficant weight change other than noted HENT: Negative for worsening hearing loss, ear pain, facial swelling, mouth sores and neck stiffness.   Eyes: Negative for other worsening pain, redness or visual disturbance.  Respiratory: Negative for shortness of breath and wheezing  Cardiovascular: Negative for chest pain and palpitations.  Gastrointestinal: Negative for diarrhea, blood in stool, abdominal distention or other pain Genitourinary: Negative for  hematuria, flank pain or change in urine volume.  Musculoskeletal: Negative for myalgias or other joint complaints.  Skin: Negative for color change and wound or drainage.  Neurological: Negative for syncope and numbness. other than noted Hematological: Negative for adenopathy. or other swelling Psychiatric/Behavioral: Negative for hallucinations, SI, self-injury, decreased concentration or other worsening agitation.        Objective:   Physical Exam BP 118/76 mmHg  Pulse 74  Temp(Src) 97.8 F (36.6 C) (Oral)  Ht '5\' 3"'$  (1.6 m)  Wt 149 lb (67.586 kg)  BMI 26.40 kg/m2  SpO2 97% VS noted,  Constitutional: Pt is oriented to person, place, and time. Appears well-developed and well-nourished, in no significant distress Head: Normocephalic and atraumatic.  Right Ear: External ear normal.  Left Ear: External ear normal.  Nose: Nose normal.  Mouth/Throat: Oropharynx is clear and moist.  Eyes: Conjunctivae and EOM are normal. Pupils are equal, round, and reactive to light.  Neck: Normal range of motion. Neck supple. No JVD present. No tracheal deviation present or significant neck LA or mass Cardiovascular: Normal rate, regular rhythm, normal heart sounds and intact distal pulses.   Pulmonary/Chest: Effort normal and breath sounds without rales or wheezing  Abdominal: Soft. Bowel sounds are normal. NT. No HSM  Musculoskeletal: Normal range of motion. Exhibits no edema.  Lymphadenopathy:  Has no cervical adenopathy.  Neurological: Pt is alert and oriented to person, place, and time. Pt has normal reflexes. No cranial nerve deficit. Motor grossly intact Skin: Skin is warm and dry. No rash noted.  Psychiatric:  Has normal mood and affect. Behavior is normal.     Assessment & Plan:

## 2015-02-11 NOTE — Patient Instructions (Addendum)
You had the flu shot today, and the Pneumovax shot today  Please continue all other medications as before, and refills have been done if requested.  Please have the pharmacy call with any other refills you may need.  Please continue your efforts at being more active, low cholesterol diet, and weight control.  You are otherwise up to date with prevention measures today.  Please keep your appointments with your specialists as you may have planned  Please go to the LAB in the Basement (turn left off the elevator) for the tests to be done today - just the hepatitis testing  .You will be contacted by phone if any changes need to be made immediately.  Otherwise, you will receive a letter about your results with an explanation, but please check with MyChart first.  Please remember to sign up for MyChart if you have not done so, as this will be important to you in the future with finding out test results, communicating by private email, and scheduling acute appointments online when needed.  Please return in 1 year for your yearly visit, or sooner if needed, with Lab testing done 3-5 days before

## 2015-03-27 ENCOUNTER — Encounter: Payer: Self-pay | Admitting: Internal Medicine

## 2015-03-27 ENCOUNTER — Ambulatory Visit (INDEPENDENT_AMBULATORY_CARE_PROVIDER_SITE_OTHER): Payer: Managed Care, Other (non HMO) | Admitting: Internal Medicine

## 2015-03-27 VITALS — BP 110/68 | HR 89 | Temp 98.2°F | Wt 149.5 lb

## 2015-03-27 DIAGNOSIS — R7302 Impaired glucose tolerance (oral): Secondary | ICD-10-CM

## 2015-03-27 DIAGNOSIS — J209 Acute bronchitis, unspecified: Secondary | ICD-10-CM

## 2015-03-27 DIAGNOSIS — C3490 Malignant neoplasm of unspecified part of unspecified bronchus or lung: Secondary | ICD-10-CM | POA: Diagnosis not present

## 2015-03-27 MED ORDER — AZITHROMYCIN 250 MG PO TABS: ORAL_TABLET | ORAL | Status: DC

## 2015-03-27 MED ORDER — ATORVASTATIN CALCIUM 40 MG PO TABS: 40.0000 mg | ORAL_TABLET | Freq: Every day | ORAL | Status: DC

## 2015-03-27 MED ORDER — HYDROCOD POLST-CPM POLST ER 10-8 MG/5ML PO SUER: 5.0000 mL | Freq: Two times a day (BID) | ORAL | Status: DC | PRN

## 2015-03-27 NOTE — Progress Notes (Signed)
Patient received education resource, including the self-management goal and tool. Patient verbalized understanding. 

## 2015-03-27 NOTE — Patient Instructions (Signed)
Please take all new medication as prescribed - the antibiotic, and cough medicine  Please continue all other medications as before, and refills have been done if requested.  Please have the pharmacy call with any other refills you may need.  Please keep your appointments with your specialists as you may have planned      

## 2015-03-27 NOTE — Progress Notes (Signed)
Subjective:    Patient ID: Nicolas Cantu, male    DOB: 11-30-1965, 49 y.o.   MRN: [id number]  HPI Here with acute onset mild to mod 2-3 days ST, HA, general weakness and malaise, with prod cough greenish sputum, but Pt denies chest pain, increased sob or doe, wheezing, orthopnea, PND, increased LE swelling, palpitations, dizziness or syncope. Pt denies new neurological symptoms such as new headache, or facial or extremity weakness or numbness   Pt denies polydipsia, polyuria,  No hemetemisis and  Pt denies recent wt loss, night sweats, loss of appetite, or other constitutional symptoms Past Medical History  Diagnosis Date  . DIABETES MELLITUS, UNCONTROLLED 02/17/2009  . Diarrhea 02/17/2009  . HEMATOCHEZIA 02/17/2009  . HYPERLIPIDEMIA 02/17/2009  . LEG PAIN, BILATERAL 02/17/2009  . OSTEOARTHRITIS, KNEES, BILATERAL 05/14/2010  . Polyuria 03/24/2010  . Preventative health care 11/15/2010  . Allergic rhinitis, cause unspecified 02/26/2011  . Asthma 02/26/2011  . Shortness of breath   . Lung mass   . 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 Diabetes mellitus     ?  Marland Kitchen Hepatitis     hx  . Lung cancer ([place]) 2013    LUL  . History of syphilis 08/24/2012    Remote tx  . Abdominal pain   . Colon polyp    Past Surgical History  Procedure Laterality Date  . Breath tek h pylori N/A 04/19/2013    Procedure: BREATH TEK Kandis Ban;  Surgeon: Jerene Bears, MD;  Location: [facility];  Service: Gastroenterology;  Laterality: N/A;    reports that he has never smoked. He has never used smokeless tobacco. He reports that he does not drink alcohol or use illicit drugs. family history is negative for Colon cancer, Esophageal cancer, Rectal cancer, and Stomach cancer. No Known Allergies Current Outpatient Prescriptions on File Prior to Visit  Medication Sig Dispense Refill  . albuterol (PROVENTIL HFA;VENTOLIN HFA) 108 (90 BASE) MCG/ACT inhaler Inhale 2 puffs into the lungs every 6 (six) hours as needed for wheezing or  shortness of breath. 1 Inhaler 5  . aspirin 81 MG EC tablet Take 81 mg by mouth daily.       No current facility-administered medications on file prior to visit.   Review of Systems  Constitutional: Negative for unusual diaphoresis or night sweats HENT: Negative for ringing in ear or discharge Eyes: Negative for double vision or worsening visual disturbance.  Respiratory: Negative for choking and stridor.   Gastrointestinal: Negative for vomiting or other signifcant bowel change Genitourinary: Negative for hematuria or change in urine volume.  Musculoskeletal: Negative for other MSK pain or swelling Skin: Negative for color change and worsening wound.  Neurological: Negative for tremors and numbness other than noted  Psychiatric/Behavioral: Negative for decreased concentration or agitation other than above       Objective:   Physical Exam BP 110/68 mmHg  Pulse 89  Temp(Src) 98.2 F (36.8 C)  Wt 149 lb 8 oz (67.813 kg)  SpO2 96% VS noted, mild ill Constitutional: Pt appears in no significant distress HENT: Head: NCAT.  Right Ear: External ear normal.  Left Ear: External ear normal.  Bilat tm's with mild erythema.  Max sinus areas non tender.  Pharynx with mild erythema, no exudate Eyes: . Pupils are equal, round, and reactive to light. Conjunctivae and EOM are normal Neck: Normal range of motion. Neck supple.  Cardiovascular: Normal rate and regular rhythm.   Pulmonary/Chest: Effort normal and  breath sounds without rales or wheezing.  Neurological: Pt is alert. Not confused , motor grossly intact Skin: Skin is warm. No rash, no LE edema Psychiatric: Pt behavior is normal. No agitation.  CLINICAL DATA: History of lung carcinoma, followup, some shortness of breath and fatigue with exercise  EXAM: CT CHEST WITH CONTRAST  TECHNIQUE: Multidetector CT imaging of the chest was performed during intravenous contrast administration.  CONTRAST: 46m ISOVUE-300 IOPAMIDOL  (ISOVUE-300) INJECTION 61%  COMPARISON: Chest x-ray of 07/10/2014 and CT chest of 10/31/2013  FINDINGS: On lung window images, no evidence of metastatic disease involving the lungs is seen. No infiltrate or effusion is noted. The central airway is patent. Postoperative changes are noted from left upper lobectomy for non-small cell carcinoma of the left lobe. No pleural effusion is seen.  On soft tissue window images, the thyroid gland is unremarkable. The pulmonary arteries opacify with no significant abnormality. The thoracic aorta also opacifies with no abnormality noted. No mediastinal or hilar adenopathy is seen. Mild cardiomegaly is stable. There may be fatty infiltration of the liver diffusely. No bony abnormality is seen.  IMPRESSION: 1. Stable postoperative change from prior left upper lobectomy. No evidence of metastatic involvement of the lungs. 2. No mediastinal or hilar adenopathy is seen. 3. Question fatty infiltration of the liver.   Electronically Signed  By: PIvar DrapeM.D.  On: 10/30/2014 15:25    Assessment & Plan:

## 2015-03-29 DIAGNOSIS — J209 Acute bronchitis, unspecified: Secondary | ICD-10-CM | POA: Insufficient documentation

## 2015-03-29 NOTE — Assessment & Plan Note (Signed)
Sp resection 2013, neg for recurrence last CT, cont to follow, declines cxr today

## 2015-03-29 NOTE — Assessment & Plan Note (Signed)
Mild to mod, for antibx course,  to f/u any worsening symptoms or concerns 

## 2015-03-29 NOTE — Assessment & Plan Note (Signed)
stable overall by history and exam, recent data reviewed with pt, and pt to continue medical treatment as before,  to f/u any worsening symptoms or concerns Lab Results  Component Value Date   HGBA1C 6.1 02/11/2015   Pt to call for onset polys or cbg > 200 with current illness

## 2015-03-31 ENCOUNTER — Telehealth: Payer: Self-pay | Admitting: Internal Medicine

## 2015-03-31 DIAGNOSIS — R059 Cough, unspecified: Secondary | ICD-10-CM

## 2015-03-31 DIAGNOSIS — R05 Cough: Secondary | ICD-10-CM

## 2015-03-31 NOTE — Telephone Encounter (Signed)
Patient was in last week with cough and it has not gotten any better. The cough syrup you gave him made him throw up. Please call to discuss. [phone]

## 2015-04-01 ENCOUNTER — Ambulatory Visit (INDEPENDENT_AMBULATORY_CARE_PROVIDER_SITE_OTHER)
Admission: RE | Admit: 2015-04-01 | Discharge: 2015-04-01 | Disposition: A | Discharge status: Home / Self Care | Payer: Managed Care, Other (non HMO) | Source: Ambulatory Visit | Attending: Internal Medicine | Admitting: Internal Medicine

## 2015-04-01 DIAGNOSIS — R05 Cough: Secondary | ICD-10-CM | POA: Diagnosis not present

## 2015-04-01 DIAGNOSIS — R059 Cough, unspecified: Secondary | ICD-10-CM

## 2015-04-01 MED ORDER — PROMETHAZINE-CODEINE 6.25-10 MG/5ML PO SYRP: 5.0000 mL | ORAL_SOLUTION | ORAL | Status: DC | PRN

## 2015-04-01 NOTE — Telephone Encounter (Signed)
Left detailed message of the instructions below.

## 2015-04-01 NOTE — Telephone Encounter (Signed)
Ok to change the rx -   phenergancodeine  - Done hardcopy to [facility]  Also for CXR = 2V - ordered for pt to have done at his convenience  Dahlia to inform pt

## 2015-04-08 ENCOUNTER — Ambulatory Visit (INDEPENDENT_AMBULATORY_CARE_PROVIDER_SITE_OTHER): Payer: Managed Care, Other (non HMO) | Admitting: Internal Medicine

## 2015-04-08 ENCOUNTER — Encounter: Payer: Self-pay | Admitting: Internal Medicine

## 2015-04-08 VITALS — BP 116/70 | HR 90 | Temp 98.1°F | Ht 63.0 in | Wt 145.0 lb

## 2015-04-08 DIAGNOSIS — J309 Allergic rhinitis, unspecified: Secondary | ICD-10-CM | POA: Diagnosis not present

## 2015-04-08 DIAGNOSIS — R062 Wheezing: Secondary | ICD-10-CM | POA: Diagnosis not present

## 2015-04-08 DIAGNOSIS — J45901 Unspecified asthma with (acute) exacerbation: Secondary | ICD-10-CM | POA: Insufficient documentation

## 2015-04-08 DIAGNOSIS — J209 Acute bronchitis, unspecified: Secondary | ICD-10-CM | POA: Diagnosis not present

## 2015-04-08 DIAGNOSIS — R7302 Impaired glucose tolerance (oral): Secondary | ICD-10-CM

## 2015-04-08 MED ORDER — METHYLPREDNISOLONE ACETATE 80 MG/ML IJ SUSP
80.0000 mg | Freq: Once | INTRAMUSCULAR | Status: AC
  Administered 2015-04-08: 80 mg via INTRAMUSCULAR

## 2015-04-08 MED ORDER — PROMETHAZINE-CODEINE 6.25-10 MG/5ML PO SYRP: 5.0000 mL | ORAL_SOLUTION | ORAL | Status: DC | PRN

## 2015-04-08 MED ORDER — TRIAMCINOLONE ACETONIDE 55 MCG/ACT NA AERO: 2.0000 | INHALATION_SPRAY | Freq: Every day | NASAL | Status: DC

## 2015-04-08 MED ORDER — CETIRIZINE HCL 10 MG PO TABS: 10.0000 mg | ORAL_TABLET | Freq: Every day | ORAL | Status: DC

## 2015-04-08 MED ORDER — ALBUTEROL SULFATE HFA 108 (90 BASE) MCG/ACT IN AERS: 2.0000 | INHALATION_SPRAY | Freq: Four times a day (QID) | RESPIRATORY_TRACT | Status: DC | PRN

## 2015-04-08 MED ORDER — PREDNISONE 10 MG PO TABS: ORAL_TABLET | ORAL | Status: DC

## 2015-04-08 NOTE — Patient Instructions (Signed)
You had the steroid shot today  Please take all new medication as prescribed - the prednisone, as well as zyrtec and nasacort AQ for allergies  Please continue all other medications as before, and refills have been done if requested - including the cough medicine, and inhaler  Please have the pharmacy call with any other refills you may need.  Please keep your appointments with your specialists as you may have planned

## 2015-04-08 NOTE — Progress Notes (Signed)
Subjective:    Patient ID: Nicolas Cantu, male    DOB: 22-Jun-1965, 49 y.o.   MRN: [id number]  HPI  Here to f/u, has done well without fever, but unfortunately with persistent nonprod cough and wheezing, sob/doe, diffuse chest aching with cough only.  Pt denies other chest pain, orthopnea, PND, increased LE swelling, palpitations, dizziness or syncope. Pt denies new neurological symptoms such as new headache, or facial or extremity weakness or numbness   Pt denies polydipsia, polyuria  Does have several wks ongoing nasal allergy symptoms with clearish congestion, itch and sneezing, without fever, pain, ST, cough, swelling or wheezing. Past Medical History  Diagnosis Date  . DIABETES MELLITUS, UNCONTROLLED 02/17/2009  . Diarrhea 02/17/2009  . HEMATOCHEZIA 02/17/2009  . HYPERLIPIDEMIA 02/17/2009  . LEG PAIN, BILATERAL 02/17/2009  . OSTEOARTHRITIS, KNEES, BILATERAL 05/14/2010  . Polyuria 03/24/2010  . Preventative health care 11/15/2010  . Allergic rhinitis, cause unspecified 02/26/2011  . Asthma 02/26/2011  . Shortness of breath   . Lung mass   . 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 Diabetes mellitus     ?  Marland Kitchen Hepatitis     hx  . Lung cancer ([place]) 2013    LUL  . History of syphilis 08/24/2012    Remote tx  . Abdominal pain   . Colon polyp    Past Surgical History  Procedure Laterality Date  . Breath tek h pylori N/A 04/19/2013    Procedure: BREATH TEK Kandis Ban;  Surgeon: Jerene Bears, MD;  Location: [facility];  Service: Gastroenterology;  Laterality: N/A;    reports that he has never smoked. He has never used smokeless tobacco. He reports that he does not drink alcohol or use illicit drugs. family history is negative for Colon cancer, Esophageal cancer, Rectal cancer, and Stomach cancer. No Known Allergies Current Outpatient Prescriptions on File Prior to Visit  Medication Sig Dispense Refill  . aspirin 81 MG EC tablet Take 81 mg by mouth daily.      Marland Kitchen atorvastatin (LIPITOR) 40 MG tablet  Take 1 tablet (40 mg total) by mouth daily. 90 tablet 3   No current facility-administered medications on file prior to visit.   Review of Systems  Constitutional: Negative for unusual diaphoresis or night sweats HENT: Negative for ringing in ear or discharge Eyes: Negative for double vision or worsening visual disturbance.  Respiratory: Negative for choking and stridor.   Gastrointestinal: Negative for vomiting or other signifcant bowel change Genitourinary: Negative for hematuria or change in urine volume.  Musculoskeletal: Negative for other MSK pain or swelling Skin: Negative for color change and worsening wound.  Neurological: Negative for tremors and numbness other than noted  Psychiatric/Behavioral: Negative for decreased concentration or agitation other than above       Objective:   Physical Exam BP 116/70 mmHg  Pulse 90  Temp(Src) 98.1 F (36.7 C) (Oral)  Ht '5\' 3"'$  (1.6 m)  Wt 145 lb (65.772 kg)  BMI 25.69 kg/m2  SpO2 94% VS noted,  Constitutional: Pt appears in no significant distress HENT: Head: NCAT.  Right Ear: External ear normal.  Left Ear: External ear normal.  Eyes: . Pupils are equal, round, and reactive to light. Conjunctivae and EOM are normal Neck: Normal range of motion. Neck supple.  Cardiovascular: Normal rate and regular rhythm.   Pulmonary/Chest: Effort normal and breath sounds decreased without rales but + mild bilat wheezing.  Neurological: Pt is alert. Not confused , motor  grossly intact Skin: Skin is warm. No rash, no LE edema Psychiatric: Pt behavior is normal. No agitation.     Assessment & Plan:

## 2015-04-12 NOTE — Assessment & Plan Note (Signed)
Mild to mod, for depomedrol, also antihist/nasal steroid tx as well,  to f/u any worsening symptoms or concerns

## 2015-04-12 NOTE — Assessment & Plan Note (Signed)
stable overall by history and exam, recent data reviewed with pt, and pt to continue medical treatment as before,  to f/u any worsening symptoms or concerns Lab Results  Component Value Date   HGBA1C 6.1 02/11/2015   Pt to call for onset polys with treatment and illness, or cbg > 200

## 2015-04-12 NOTE — Assessment & Plan Note (Signed)
C/w likely bronchospasm likely infectious related with recent illness, for depomedrol IM, predpac asd, albut MDI prn, to f/u any worsening symptoms or concerns

## 2015-04-12 NOTE — Assessment & Plan Note (Signed)
S/p antibx, improved,  to f/u any worsening symptoms or concerns

## 2015-07-01 ENCOUNTER — Encounter: Payer: Self-pay | Admitting: Internal Medicine

## 2015-08-12 ENCOUNTER — Ambulatory Visit (AMBULATORY_SURGERY_CENTER): Payer: Self-pay | Admitting: *Deleted

## 2015-08-12 VITALS — Ht 63.0 in | Wt 150.8 lb

## 2015-08-12 DIAGNOSIS — Z86010 Personal history of colonic polyps: Secondary | ICD-10-CM

## 2015-08-12 MED ORDER — NA SULFATE-K SULFATE-MG SULF 17.5-3.13-1.6 GM/177ML PO SOLN: 1.0000 | Freq: Once | ORAL | Status: DC

## 2015-08-12 NOTE — Progress Notes (Signed)
No egg or soy allergy known to patient  No issues with past sedation with any surgeries  or procedures, no intubation problems  No diet pills per patient No home 02 use per patient  No blood thinners per patient    

## 2015-08-26 ENCOUNTER — Encounter: Payer: Managed Care, Other (non HMO) | Admitting: Internal Medicine

## 2015-08-28 ENCOUNTER — Encounter: Payer: Self-pay | Admitting: Internal Medicine

## 2015-08-28 ENCOUNTER — Ambulatory Visit (AMBULATORY_SURGERY_CENTER): Payer: Managed Care, Other (non HMO) | Admitting: Internal Medicine

## 2015-08-28 VITALS — BP 101/67 | HR 80 | Temp 98.6°F | Resp 16 | Ht 63.0 in | Wt 150.0 lb

## 2015-08-28 DIAGNOSIS — Z86010 Personal history of colonic polyps: Secondary | ICD-10-CM

## 2015-08-28 DIAGNOSIS — D129 Benign neoplasm of anus and anal canal: Secondary | ICD-10-CM

## 2015-08-28 DIAGNOSIS — D120 Benign neoplasm of cecum: Secondary | ICD-10-CM

## 2015-08-28 DIAGNOSIS — D128 Benign neoplasm of rectum: Secondary | ICD-10-CM | POA: Diagnosis not present

## 2015-08-28 DIAGNOSIS — D122 Benign neoplasm of ascending colon: Secondary | ICD-10-CM

## 2015-08-28 MED ORDER — SODIUM CHLORIDE 0.9 % IV SOLN: 500.0000 mL | INTRAVENOUS | Status: DC

## 2015-08-28 NOTE — Progress Notes (Signed)
Called to room to assist during endoscopic procedure.  Patient ID and intended procedure confirmed with present staff. Received instructions for my participation in the procedure from the performing physician.  

## 2015-08-28 NOTE — Progress Notes (Signed)
Report to PACU, RN, vss, BBS= Clear.  

## 2015-08-28 NOTE — Patient Instructions (Signed)
Discharge instructions given. Handouts on polyps and hemorrhoids. Resume previous medications. YOU HAD AN ENDOSCOPIC PROCEDURE TODAY AT [facility]:   Refer to the procedure report that was given to you for any specific questions about what was found during the examination.  If the procedure report does not answer your questions, please call your gastroenterologist to clarify.  If you requested that your care partner not be given the details of your procedure findings, then the procedure report has been included in a sealed envelope for you to review at your convenience later.  YOU SHOULD EXPECT: Some feelings of bloating in the abdomen. Passage of more gas than usual.  Walking can help get rid of the air that was put into your GI tract during the procedure and reduce the bloating. If you had a lower endoscopy (such as a colonoscopy or flexible sigmoidoscopy) you may notice spotting of blood in your stool or on the toilet paper. If you underwent a bowel prep for your procedure, you may not have a normal bowel movement for a few days.  Please Note:  You might notice some irritation and congestion in your nose or some drainage.  This is from the oxygen used during your procedure.  There is no need for concern and it should clear up in a day or so.  SYMPTOMS TO REPORT IMMEDIATELY:   Following lower endoscopy (colonoscopy or flexible sigmoidoscopy):  Excessive amounts of blood in the stool  Significant tenderness or worsening of abdominal pains  Swelling of the abdomen that is new, acute  Fever of 100F or higher   For urgent or emergent issues, a gastroenterologist can be reached at any hour by calling [phone].   DIET: Your first meal following the procedure should be a small meal and then it is ok to progress to your normal diet. Heavy or fried foods are harder to digest and may make you feel nauseous or bloated.  Likewise, meals heavy in dairy and vegetables can increase  bloating.  Drink plenty of fluids but you should avoid alcoholic beverages for 24 hours.  ACTIVITY:  You should plan to take it easy for the rest of today and you should NOT DRIVE or use heavy machinery until tomorrow (because of the sedation medicines used during the test).    FOLLOW UP: Our staff will call the number listed on your records the next business day following your procedure to check on you and address any questions or concerns that you may have regarding the information given to you following your procedure. If we do not reach you, we will leave a message.  However, if you are feeling well and you are not experiencing any problems, there is no need to return our call.  We will assume that you have returned to your regular daily activities without incident.  If any biopsies were taken you will be contacted by phone or by letter within the next 1-3 weeks.  Please call us at [phone] if you have not heard about the biopsies in 3 weeks.    SIGNATURES/CONFIDENTIALITY: You and/or your care partner have signed paperwork which will be entered into your electronic medical record.  These signatures attest to the fact that that the information above on your After Visit Summary has been reviewed and is understood.  Full responsibility of the confidentiality of this discharge information lies with you and/or your care-partner.

## 2015-08-28 NOTE — Op Note (Signed)
[place] Patient Name: Nicolas Cantu Procedure Date: 08/28/2015 9:55 AM MRN: [id number] Endoscopist: Jerene Bears , MD Age: 50 Referring MD:  Date of Birth: 10/24/1965 Gender: Male Procedure:                Colonoscopy Indications:              Surveillance: Personal history of adenomatous                            polyps on last colonoscopy 3 years ago Medicines:                Monitored Anesthesia Care Procedure:                Pre-Anesthesia Assessment:                           - Prior to the procedure, a History and Physical                            was performed, and patient medications and                            allergies were reviewed. The patient's tolerance of                            previous anesthesia was also reviewed. The risks                            and benefits of the procedure and the sedation                            options and risks were discussed with the patient.                            All questions were answered, and informed consent                            was obtained. Prior Anticoagulants: The patient has                            taken no previous anticoagulant or antiplatelet                            agents. ASA Grade Assessment: III - A patient with                            severe systemic disease. After reviewing the risks                            and benefits, the patient was deemed in                            satisfactory condition to undergo the procedure.  After obtaining informed consent, the colonoscope                            was passed under direct vision. Throughout the                            procedure, the patient's blood pressure, pulse, and                            oxygen saturations were monitored continuously. The                            Model CF-HQ190L [id number]) scope was introduced                            through the anus and advanced to the the cecum,                          identified by appendiceal orifice and ileocecal                            valve. The colonoscopy was performed without                            difficulty. The patient tolerated the procedure                            well. The quality of the bowel preparation was                            excellent. The ileocecal valve, appendiceal                            orifice, and rectum were photographed. Scope In: 10:09:19 AM Scope Out: 10:20:37 AM Scope Withdrawal Time: 0 hours 10 minutes 12 seconds  Total Procedure Duration: 0 hours 11 minutes 18 seconds  Findings:      The digital rectal exam was normal.      A 6 mm polyp was found in the cecum. The polyp was semi-pedunculated.       The polyp was removed with a cold snare. Resection and retrieval were       complete.      A 4 mm polyp was found in the ascending colon. The polyp was sessile.       The polyp was removed with a cold snare. Resection and retrieval were       complete.      A 3 mm polyp was found in the rectum. The polyp was sessile. The polyp       was removed with a cold snare. Resection and retrieval were complete.      Internal hemorrhoids were found during retroflexion. The hemorrhoids       were small. Complications:            No immediate complications. Estimated Blood Loss:     Estimated blood loss was minimal. Impression:               -  One 6 mm polyp in the cecum, removed with a cold                            snare. Resected and retrieved.                           - One 4 mm polyp in the ascending colon, removed                            with a cold snare. Resected and retrieved.                           - One 3 mm polyp in the rectum, removed with a cold                            snare. Resected and retrieved.                           - Internal hemorrhoids. Recommendation:           - Patient has a contact number available for                            emergencies. The signs  and symptoms of potential                            delayed complications were discussed with the                            patient. Return to normal activities tomorrow.                            Written discharge instructions were provided to the                            patient.                           - Resume previous diet.                           - Continue present medications.                           - Repeat colonoscopy is recommended for                            surveillance. The colonoscopy date will be                            determined after pathology results from today's                            exam become available for review. Procedure Code(s):        --- Professional ---  45385, Colonoscopy, flexible; with removal of                            tumor(s), polyp(s), or other lesion(s) by snare                            technique CPT copyright 2016 [employer] Medical Association. All rights reserved. Nicolas Cantu. Nicolas Fredrickson, MD Jerene Bears, MD 08/28/2015 10:24:18 AM This report has been signed electronically. Number of Addenda: 0 Referring MD:      Biagio Borg

## 2015-08-29 ENCOUNTER — Telehealth: Payer: Self-pay | Admitting: *Deleted

## 2015-08-29 NOTE — Telephone Encounter (Signed)
No answer, left message to call if questions or concerns. 

## 2015-09-02 ENCOUNTER — Encounter: Payer: Self-pay | Admitting: Internal Medicine

## 2015-09-25 ENCOUNTER — Other Ambulatory Visit: Payer: Self-pay | Admitting: *Deleted

## 2015-09-25 DIAGNOSIS — C3412 Malignant neoplasm of upper lobe, left bronchus or lung: Secondary | ICD-10-CM

## 2015-11-05 ENCOUNTER — Ambulatory Visit
Admission: RE | Admit: 2015-11-05 | Discharge: 2015-11-05 | Disposition: A | Discharge status: Home / Self Care | Payer: Managed Care, Other (non HMO) | Source: Ambulatory Visit | Attending: Cardiothoracic Surgery | Admitting: Cardiothoracic Surgery

## 2015-11-05 ENCOUNTER — Encounter: Payer: Self-pay | Admitting: Cardiothoracic Surgery

## 2015-11-05 ENCOUNTER — Ambulatory Visit (INDEPENDENT_AMBULATORY_CARE_PROVIDER_SITE_OTHER): Payer: Managed Care, Other (non HMO) | Admitting: Cardiothoracic Surgery

## 2015-11-05 VITALS — BP 123/75 | HR 107 | Resp 18 | Ht 63.0 in | Wt 153.0 lb

## 2015-11-05 DIAGNOSIS — C3412 Malignant neoplasm of upper lobe, left bronchus or lung: Secondary | ICD-10-CM | POA: Diagnosis not present

## 2015-11-05 DIAGNOSIS — Z902 Acquired absence of lung [part of]: Secondary | ICD-10-CM | POA: Diagnosis not present

## 2015-11-05 MED ORDER — IOPAMIDOL (ISOVUE-300) INJECTION 61%
75.0000 mL | Freq: Once | INTRAVENOUS | Status: AC | PRN
Start: 2015-11-05 — End: 2015-11-05
  Administered 2015-11-05: 75 mL via INTRAVENOUS

## 2015-11-05 NOTE — Progress Notes (Signed)
PCP is Cathlean Cower, MD Referring Provider is Biagio Borg, MD  Chief Complaint  Patient presents with  . 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 He has no pulmonary symptoms. He is working full-time His CT scan performed is personally reviewed , counseled with patient There is no evidence of recurrent or new areas of malignancy in his lungs or mediastinal nodes. Past Medical History  Diagnosis Date  . DIABETES MELLITUS, UNCONTROLLED 02/17/2009  . Diarrhea 02/17/2009  . HEMATOCHEZIA 02/17/2009  . HYPERLIPIDEMIA 02/17/2009  . LEG PAIN, BILATERAL 02/17/2009  . OSTEOARTHRITIS, KNEES, BILATERAL 05/14/2010  . Polyuria 03/24/2010  . Preventative health care 11/15/2010  . Allergic rhinitis, cause unspecified 02/26/2011  . Asthma 02/26/2011  . Shortness of breath   . Lung mass   . 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 Diabetes mellitus     ?  Marland Kitchen Hepatitis     hx  . Lung cancer ([place]) 2013    LUL  . History of syphilis 08/24/2012    Remote tx  . Abdominal pain   . Colon polyp   . Allergy     Past Surgical History  Procedure Laterality Date  . Breath tek h pylori N/A 04/19/2013    Procedure: BREATH TEK Kandis Ban;  Surgeon: Jerene Bears, MD;  Location: [facility];  Service: Gastroenterology;  Laterality: N/A;  . Colonoscopy    . Esophagogastroduodenoscopy    . Lung removal, partial  10-18-2011    LUL    Family History  Problem Relation Age of Onset  . Colon cancer Neg Hx   . Esophageal cancer Neg Hx   . Rectal cancer Neg Hx   . Stomach cancer Neg Hx   . Colon polyps Neg Hx     Social History Social History  Substance Use Topics  . Smoking status: Never Smoker   . Smokeless tobacco: Never Used  . Alcohol Use: No     Comment: rare    Current Outpatient Prescriptions  Medication Sig Dispense  Refill  . aspirin 81 MG EC tablet Take 81 mg by mouth daily.      Marland Kitchen atorvastatin (LIPITOR) 40 MG tablet Take 1 tablet (40 mg total) by mouth daily. 90 tablet 3  . albuterol (PROVENTIL HFA;VENTOLIN HFA) 108 (90 BASE) MCG/ACT inhaler Inhale 2 puffs into the lungs every 6 (six) hours as needed for wheezing or shortness of breath. (Patient not taking: Reported on 08/12/2015) 1 Inhaler 5  . cetirizine (ZYRTEC) 10 MG tablet Take 1 tablet (10 mg total) by mouth daily. (Patient not taking: Reported on 08/12/2015) 30 tablet 11  . triamcinolone (NASACORT AQ) 55 MCG/ACT AERO nasal inhaler Place 2 sprays into the nose daily. (Patient not taking: Reported on 08/12/2015) 1 Inhaler 12   No current facility-administered medications for this visit.    No Known Allergies  Review of Systems  Nonsmoker Has intermittent cough but no hemoptysis  BP 123/75 mmHg  Pulse 107  Resp 18  Ht '5\' 3"'$  (1.6 m)  Wt 153 lb (69.4 kg)  BMI 27.11 kg/m2  SpO2 98% Physical Exam Alert and comfortable No adenopathy the neck Breath sounds with some wheezes on the left side Heart rate regular Abdomen soft  Diagnostic Tests: CT scan shows no evidence recurrent cancer. He is  considered cancer free now almost 5 years after surgery.  Impression: No evidence recurrent cancer status post left upper lobectomy for lung cancer 2013  Plan:return as needed   Ivin Poot III, MD [facility] [phone] Patient returns for further followup and review of a surveillance CT scan after left upper lobectomy for stage I A. Bronchogenic carcinoma 2013

## 2015-11-19 ENCOUNTER — Ambulatory Visit (INDEPENDENT_AMBULATORY_CARE_PROVIDER_SITE_OTHER): Payer: Managed Care, Other (non HMO) | Admitting: Internal Medicine

## 2015-11-19 ENCOUNTER — Encounter: Payer: Self-pay | Admitting: Internal Medicine

## 2015-11-19 VITALS — BP 130/70 | HR 100 | Temp 98.6°F | Resp 20 | Wt 152.0 lb

## 2015-11-19 DIAGNOSIS — J4521 Mild intermittent asthma with (acute) exacerbation: Secondary | ICD-10-CM | POA: Diagnosis not present

## 2015-11-19 DIAGNOSIS — J309 Allergic rhinitis, unspecified: Secondary | ICD-10-CM

## 2015-11-19 DIAGNOSIS — L989 Disorder of the skin and subcutaneous tissue, unspecified: Secondary | ICD-10-CM | POA: Insufficient documentation

## 2015-11-19 DIAGNOSIS — H1013 Acute atopic conjunctivitis, bilateral: Secondary | ICD-10-CM

## 2015-11-19 MED ORDER — HYDROCODONE-HOMATROPINE 5-1.5 MG/5ML PO SYRP: 5.0000 mL | ORAL_SOLUTION | Freq: Four times a day (QID) | ORAL | Status: DC | PRN

## 2015-11-19 MED ORDER — AZITHROMYCIN 250 MG PO TABS: ORAL_TABLET | ORAL | Status: DC

## 2015-11-19 MED ORDER — PREDNISONE 10 MG PO TABS: ORAL_TABLET | ORAL | Status: DC

## 2015-11-19 MED ORDER — ALBUTEROL SULFATE HFA 108 (90 BASE) MCG/ACT IN AERS: 2.0000 | INHALATION_SPRAY | Freq: Four times a day (QID) | RESPIRATORY_TRACT | Status: DC | PRN

## 2015-11-19 NOTE — Progress Notes (Signed)
Pre visit review using our clinic review tool, if applicable. No additional management support is needed unless otherwise documented below in the visit note. 

## 2015-11-19 NOTE — Assessment & Plan Note (Signed)
Very large warty lesion distal post right 4th finger - for derm referral

## 2015-11-19 NOTE — Progress Notes (Signed)
Subjective:    Patient ID: Nicolas Cantu, male    DOB: Oct 08, 1965, 50 y.o.   MRN: [id number]  HPI  Here to f/u ? Allergy symptoms, with c/o mild eye itching and burning, redness and puffiness for 2 wks, assoc with some Does have several wks ongoing nasal allergy symptoms with clearish congestion, itch and sneezing, with ? Mild low grade temp and worsening pain, but has had also significant post nasal gtt, worsening clearish to yellow color prod cough without blood, and onset last 2 days mild wheezing and sob/doe.  Also has a chronic but recently worsening rather large wart to 4th finger right hand and asks for removal.  Nonpainful,but getting in way of using hand at work.   Recent CT chest neg for lung ca recurrence, now for prn f/u only. Past Medical History  Diagnosis Date  . DIABETES MELLITUS, UNCONTROLLED 02/17/2009  . Diarrhea 02/17/2009  . HEMATOCHEZIA 02/17/2009  . HYPERLIPIDEMIA 02/17/2009  . LEG PAIN, BILATERAL 02/17/2009  . OSTEOARTHRITIS, KNEES, BILATERAL 05/14/2010  . Polyuria 03/24/2010  . Preventative health care 11/15/2010  . Allergic rhinitis, cause unspecified 02/26/2011  . Asthma 02/26/2011  . Shortness of breath   . Lung mass   . 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 Diabetes mellitus     ?  Marland Kitchen Hepatitis     hx  . Lung cancer ([place]) 2013    LUL  . History of syphilis 08/24/2012    Remote tx  . Abdominal pain   . Colon polyp   . Allergy    Past Surgical History  Procedure Laterality Date  . Breath tek h pylori N/A 04/19/2013    Procedure: BREATH TEK Kandis Ban;  Surgeon: Jerene Bears, MD;  Location: [facility];  Service: Gastroenterology;  Laterality: N/A;  . Colonoscopy    . Esophagogastroduodenoscopy    . Lung removal, partial  10-18-2011    LUL    reports that he has never smoked. He has never used smokeless tobacco. He reports that he does not drink alcohol or use illicit drugs. family history is negative for Colon cancer, Esophageal cancer, Rectal cancer, Stomach  cancer, and Colon polyps. No Known Allergies Current Outpatient Prescriptions on File Prior to Visit  Medication Sig Dispense Refill  . aspirin 81 MG EC tablet Take 81 mg by mouth daily.      Marland Kitchen atorvastatin (LIPITOR) 40 MG tablet Take 1 tablet (40 mg total) by mouth daily. 90 tablet 3  . cetirizine (ZYRTEC) 10 MG tablet Take 1 tablet (10 mg total) by mouth daily. 30 tablet 11  . triamcinolone (NASACORT AQ) 55 MCG/ACT AERO nasal inhaler Place 2 sprays into the nose daily. 1 Inhaler 12   No current facility-administered medications on file prior to visit.   Review of Systems  Constitutional: Negative for unusual diaphoresis or night sweats HENT: Negative for ear swelling or discharge Eyes: Negative for worsening visual haziness  Respiratory: Negative for choking and stridor.   Gastrointestinal: Negative for distension or worsening eructation Genitourinary: Negative for retention or change in urine volume.  Musculoskeletal: Negative for other MSK pain or swelling Skin: Negative for color change and worsening wound Neurological: Negative for tremors and numbness other than noted  Psychiatric/Behavioral: Negative for decreased concentration or agitation other than above       Objective:   Physical Exam BP 130/70 mmHg  Pulse 100  Temp(Src) 98.6 F (37 C) (Oral)  Resp 20  Wt 152 lb (  68.947 kg)  SpO2 97% VS noted, mild ill appearing Constitutional: Pt appears in no apparent distress HENT: Head: NCAT.  Right Ear: External ear normal.  Left Ear: External ear normal.  Eyes: . Pupils are equal, round, and reactive to light. Conjunctivae bilat with mild erythema, eyelids somewhat swollen bilat upper and lower, and EOM are normal Neck: Normal range of motion. Neck supple.  Cardiovascular: Normal rate and regular rhythm.   Pulmonary/Chest: Effort normal and breath sounds without rales but with mild bilat course BS and wheezing.  Abd:  Soft, NT, ND, + BS Neurological: Pt is alert. Not  confused , motor grossly intact Skin: Skin is warm. No rash, no LE edema, but with > 10 mm raised warty lesion to distal 4th finger right hand just distal to the DIP posteriorly, NT, no drainage Psychiatric: Pt behavior is normal. No agitation.      Assessment & Plan:

## 2015-11-19 NOTE — Patient Instructions (Signed)
Please take all new medication as prescribed - the antibiotic, prednisone and inhaler as needed  Also you are given the cough medicine to use as needed  OK to use the OTC Zaditor eye drops for the eyes, and continue the zyrtec  Please continue all other medications as before, and refills have been done if requested.  Please have the pharmacy call with any other refills you may need.  Please keep your appointments with your specialists as you may have planned

## 2015-11-20 DIAGNOSIS — H1010 Acute atopic conjunctivitis, unspecified eye: Secondary | ICD-10-CM | POA: Insufficient documentation

## 2015-11-20 NOTE — Assessment & Plan Note (Signed)
Mild to mod, for depomedrol IM, predpac asd, inhaler prn, cough med prn for comfort, to f/u any worsening symptoms or concerns

## 2015-11-20 NOTE — Assessment & Plan Note (Addendum)
Mild to mod, for zyrtec prn,  to f/u any worsening symptoms or concerns 

## 2015-11-20 NOTE — Assessment & Plan Note (Signed)
Mild to mod, for otc zaditor prn,,  to f/u any worsening symptoms or concerns

## 2016-02-11 ENCOUNTER — Other Ambulatory Visit (INDEPENDENT_AMBULATORY_CARE_PROVIDER_SITE_OTHER): Payer: Managed Care, Other (non HMO)

## 2016-02-11 ENCOUNTER — Telehealth: Payer: Self-pay

## 2016-02-11 DIAGNOSIS — Z0000 Encounter for general adult medical examination without abnormal findings: Secondary | ICD-10-CM | POA: Diagnosis not present

## 2016-02-11 DIAGNOSIS — R7302 Impaired glucose tolerance (oral): Secondary | ICD-10-CM | POA: Diagnosis not present

## 2016-02-11 LAB — HEPATIC FUNCTION PANEL
ALK PHOS: 81 U/L (ref 39–117)
ALT: 36 U/L (ref 0–53)
AST: 38 U/L — AB (ref 0–37)
Albumin: 4.5 g/dL (ref 3.5–5.2)
BILIRUBIN TOTAL: 0.8 mg/dL (ref 0.2–1.2)
Bilirubin, Direct: 0.2 mg/dL (ref 0.0–0.3)
Total Protein: 7.5 g/dL (ref 6.0–8.3)

## 2016-02-11 LAB — BASIC METABOLIC PANEL
BUN: 15 mg/dL (ref 6–23)
CALCIUM: 9.4 mg/dL (ref 8.4–10.5)
CO2: 32 meq/L (ref 19–32)
CREATININE: 0.93 mg/dL (ref 0.40–1.50)
Chloride: 102 mEq/L (ref 96–112)
GFR: 90.77 mL/min (ref >60.00)
GLUCOSE: 105 mg/dL — AB (ref 70–99)
Potassium: 4.8 mEq/L (ref 3.5–5.1)
Sodium: 138 mEq/L (ref 135–145)

## 2016-02-11 LAB — LIPID PANEL
Cholesterol: 173 mg/dL (ref 0–200)
HDL: 67.9 mg/dL (ref >39.00)
LDL Cholesterol: 75 mg/dL (ref 0–99)
NONHDL: 104.83
Total CHOL/HDL Ratio: 3
Triglycerides: 150 mg/dL — ABNORMAL HIGH (ref 0.0–149.0)
VLDL: 30 mg/dL (ref 0.0–40.0)

## 2016-02-11 LAB — CBC
HEMATOCRIT: 41.9 % (ref 39.0–52.0)
HEMOGLOBIN: 14.5 g/dL (ref 13.0–17.0)
MCHC: 34.5 g/dL (ref 30.0–36.0)
MCV: 86.8 fl (ref 78.0–100.0)
PLATELETS: 234 10*3/uL (ref 150.0–400.0)
RBC: 4.83 Mil/uL (ref 4.22–5.81)
RDW: 12.5 % (ref 11.5–15.5)
WBC: 5.6 10*3/uL (ref 4.0–10.5)

## 2016-02-11 LAB — HEMOGLOBIN A1C: HEMOGLOBIN A1C: 6.2 % (ref 4.6–6.5)

## 2016-02-11 NOTE — Telephone Encounter (Signed)
Orders placed.

## 2016-02-12 ENCOUNTER — Other Ambulatory Visit (INDEPENDENT_AMBULATORY_CARE_PROVIDER_SITE_OTHER): Payer: Managed Care, Other (non HMO)

## 2016-02-12 ENCOUNTER — Encounter: Payer: Self-pay | Admitting: Internal Medicine

## 2016-02-12 ENCOUNTER — Ambulatory Visit (INDEPENDENT_AMBULATORY_CARE_PROVIDER_SITE_OTHER): Payer: Managed Care, Other (non HMO) | Admitting: Internal Medicine

## 2016-02-12 VITALS — BP 124/72 | HR 69 | Temp 97.6°F | Resp 20 | Wt 152.0 lb

## 2016-02-12 DIAGNOSIS — R6889 Other general symptoms and signs: Secondary | ICD-10-CM | POA: Diagnosis not present

## 2016-02-12 DIAGNOSIS — Z0001 Encounter for general adult medical examination with abnormal findings: Secondary | ICD-10-CM | POA: Diagnosis not present

## 2016-02-12 DIAGNOSIS — Z23 Encounter for immunization: Secondary | ICD-10-CM

## 2016-02-12 DIAGNOSIS — Z7184 Encounter for health counseling related to travel: Secondary | ICD-10-CM | POA: Insufficient documentation

## 2016-02-12 DIAGNOSIS — E785 Hyperlipidemia, unspecified: Secondary | ICD-10-CM

## 2016-02-12 DIAGNOSIS — R7302 Impaired glucose tolerance (oral): Secondary | ICD-10-CM | POA: Diagnosis not present

## 2016-02-12 DIAGNOSIS — Z0000 Encounter for general adult medical examination without abnormal findings: Secondary | ICD-10-CM

## 2016-02-12 DIAGNOSIS — Z7189 Other specified counseling: Secondary | ICD-10-CM

## 2016-02-12 DIAGNOSIS — K219 Gastro-esophageal reflux disease without esophagitis: Secondary | ICD-10-CM | POA: Diagnosis not present

## 2016-02-12 LAB — TSH: TSH: 0.76 u[IU]/mL (ref 0.35–4.50)

## 2016-02-12 LAB — PSA: PSA: 0.66 ng/mL (ref 0.10–4.00)

## 2016-02-12 MED ORDER — MEFLOQUINE HCL 250 MG PO TABS: 250.0000 mg | ORAL_TABLET | ORAL | 0 refills | Status: DC

## 2016-02-12 NOTE — Patient Instructions (Addendum)
You had the Tetanus shot today  Please take all new medication as prescribed - the mefloquine (to start one wk prior to travel, then once per wk for the 2 wks you are in [place], then once per wk for 2 weeks after)  Please continue all other medications as before, and refills have been done if requested.  Please have the pharmacy call with any other refills you may need.  Please continue your efforts at being more active, low cholesterol diet, and weight control.  You are otherwise up to date with prevention measures today.  Please keep your appointments with your specialists as you may have planned  Your PSA is ordered for today if you want to do this, otherwise we will have ti done for sure next year  Please go to the LAB in the Basement (turn left off the elevator) for the tests to be done today  You will be contacted by phone if any changes need to be made immediately.  Otherwise, you will receive a letter about your results with an explanation, but please check with MyChart first.  Please remember to sign up for MyChart if you have not done so, as this will be important to you in the future with finding out test results, communicating by private email, and scheduling acute appointments online when needed.  Please return in 1 year for your yearly visit, or sooner if needed, with Lab testing done 3-5 days before

## 2016-02-12 NOTE — Progress Notes (Signed)
Subjective:    Patient ID: Nicolas Cantu, male    DOB: Jan 08, 1966, 50 y.o.   MRN: [id number]  HPI  Here for wellness and f/u;  Overall doing ok;  Pt denies Chest pain, worsening SOB, DOE, wheezing, orthopnea, PND, worsening LE edema, palpitations, dizziness or syncope.  Pt denies neurological change such as new headache, facial or extremity weakness.  Pt denies polydipsia, polyuria, or low sugar symptoms. Pt states overall good compliance with treatment and medications, good tolerability, and has been trying to follow appropriate diet.  Pt denies worsening depressive symptoms, suicidal ideation or panic. No fever, night sweats, wt loss, loss of appetite, or other constitutional symptoms.  Pt states good ability with ADL's, has low fall risk, home safety reviewed and adequate, no other significant changes in hearing or vision, and only occasionally active with exercise.  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  Denies urinary symptoms such as dysuria, frequency, urgency, flank pain, hematuria or n/v, fever, chills; did not have PSA done with labs for some reason.  Denies worsening reflux, abd pain, dysphagia, n/v, bowel change or blood.  Trying to follow lower chol diet. Past Medical History:  Diagnosis Date  . Abdominal pain   . Allergic rhinitis, cause unspecified 02/26/2011  . Allergy   . Asthma 02/26/2011  . Colon polyp   . Diabetes mellitus    ?  Marland Kitchen DIABETES MELLITUS, UNCONTROLLED 02/17/2009  . Diarrhea 02/17/2009  . HEMATOCHEZIA 02/17/2009  . Hepatitis    hx  . History of syphilis 08/24/2012   Remote tx  . HYPERLIPIDEMIA 02/17/2009  . LEG PAIN, BILATERAL 02/17/2009  . Lung cancer ([place]) 2013   LUL  . Lung mass   . OSTEOARTHRITIS, KNEES, BILATERAL 05/14/2010  . Polyuria 03/24/2010  . Preventative health care 11/15/2010  . Shortness of breath   . Tuberculosis    hx of tb 93 tx   Past Surgical History:  Procedure Laterality Date  . BREATH TEK H PYLORI N/A 04/19/2013   Procedure: BREATH TEK H PYLORI;  Surgeon: Jerene Bears, MD;  Location: [facility];  Service: Gastroenterology;  Laterality: N/A;  . COLONOSCOPY    . ESOPHAGOGASTRODUODENOSCOPY    . LUNG REMOVAL, PARTIAL  10-18-2011   LUL    reports that he has never smoked. He has never used smokeless tobacco. He reports that he does not drink alcohol or use drugs. family history is not on file. No Known Allergies  Review of Systems Constitutional: Negative for increased diaphoresis, or other activity, appetite or siginficant weight change other than noted HENT: Negative for worsening hearing loss, ear pain, facial swelling, mouth sores and neck stiffness.   Eyes: Negative for other worsening pain, redness or visual disturbance.  Respiratory: Negative for choking or stridor Cardiovascular: Negative for other chest pain and palpitations.  Gastrointestinal: Negative for worsening diarrhea, blood in stool, or abdominal distention Genitourinary: Negative for hematuria, flank pain or change in urine volume.  Musculoskeletal: Negative for myalgias or other joint complaints.  Skin: Negative for other color change and wound or drainage.  Neurological: Negative for syncope and numbness. other than noted Hematological: Negative for adenopathy. or other swelling Psychiatric/Behavioral: Negative for hallucinations, SI, self-injury, decreased concentration or other worsening agitation.      Objective:   Physical Exam BP 124/72   Pulse 69   Temp 97.6 F (  36.4 C) (Oral)   Resp 20   Wt 152 lb (68.9 kg)   SpO2 98%   BMI 26.93 kg/m  VS noted,  Constitutional: Pt is oriented to person, place, and time. Appears well-developed and well-nourished, in no significant distress Head: Normocephalic and atraumatic  Eyes: Conjunctivae and EOM are normal. Pupils are equal, round, and reactive to light Right Ear:  External ear normal.  Left Ear: External ear normal Nose: Nose normal.  Mouth/Throat: Oropharynx is clear and moist  Neck: Normal range of motion. Neck supple. No JVD present. No tracheal deviation present or significant neck LA or mass Cardiovascular: Normal rate, regular rhythm, normal heart sounds and intact distal pulses.   Pulmonary/Chest: Effort normal and breath sounds without rales or wheezing  Abdominal: Soft. Bowel sounds are normal. NT. No HSM  Musculoskeletal: Normal range of motion. Exhibits no edema Lymphadenopathy: Has no cervical adenopathy.  Neurological: Pt is alert and oriented to person, place, and time. Pt has normal reflexes. No cranial nerve deficit. Motor grossly intact Skin: Skin is warm and dry. No rash noted or new ulcers Psychiatric:  Has normal mood and affect. Behavior is normal.     Assessment & Plan:

## 2016-02-12 NOTE — Progress Notes (Signed)
Pre visit review using our clinic review tool, if applicable. No additional management support is needed unless otherwise documented below in the visit note. 

## 2016-02-12 NOTE — Progress Notes (Signed)
   Subjective:    Patient ID: Nicolas Cantu, male    DOB: February 15, 1966, 50 y.o.   MRN: [id number]  HPI  Here for wellness and f/u;  Overall doing ok;  Pt denies Chest pain, worsening SOB, DOE, wheezing, orthopnea, PND, worsening LE edema, palpitations, dizziness or syncope.  Pt denies neurological change such as new headache, facial or extremity weakness.  Pt denies polydipsia, polyuria, or low sugar symptoms. Pt states overall good compliance with treatment and medications, good tolerability, and has been trying to follow appropriate diet.  Pt denies worsening depressive symptoms, suicidal ideation or panic. No fever, night sweats, wt loss, loss of appetite, or other constitutional symptoms.  Pt states good ability with ADL's, has low fall risk, home safety reviewed and adequate, no other significant changes in hearing or vision, and only occasionally active with exercise.     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  Has already had cholera and typhoid shot prior to last trip, and is due today for tetanus as well    Review of Systems     Objective:   Physical Exam        Assessment & Plan:

## 2016-02-15 NOTE — Assessment & Plan Note (Signed)

## 2016-02-15 NOTE — Assessment & Plan Note (Signed)
stable overall by history and exam, recent data reviewed with pt, and pt to continue medical treatment as before,  to f/u any worsening symptoms or concerns Lab Results  Component Value Date   LDLCALC 75 02/11/2016

## 2016-02-15 NOTE — Assessment & Plan Note (Signed)
Stable without PPI or other tx at this time,  to f/u any worsening symptoms or concerns

## 2016-02-15 NOTE — Assessment & Plan Note (Addendum)
[facility] for mefloquine asd,  to f/u any worsening symptoms or concerns In addition to the time spent performing CPE, I spent an additional 15 minutes face to face,in which greater than 50% of this time was spent in counseling and coordination of care for patient's illness as documented.

## 2016-02-15 NOTE — Assessment & Plan Note (Signed)
stable overall by history and exam, recent data reviewed with pt, and pt to continue medical treatment as before,  to f/u any worsening symptoms or concerns Lab Results  Component Value Date   HGBA1C 6.2 02/11/2016

## 2016-10-07 ENCOUNTER — Ambulatory Visit (INDEPENDENT_AMBULATORY_CARE_PROVIDER_SITE_OTHER): Payer: Managed Care, Other (non HMO) | Admitting: Internal Medicine

## 2016-10-07 ENCOUNTER — Encounter: Payer: Self-pay | Admitting: Internal Medicine

## 2016-10-07 VITALS — BP 102/66 | HR 80 | Ht 63.0 in | Wt 149.0 lb

## 2016-10-07 DIAGNOSIS — J069 Acute upper respiratory infection, unspecified: Secondary | ICD-10-CM | POA: Insufficient documentation

## 2016-10-07 DIAGNOSIS — H1013 Acute atopic conjunctivitis, bilateral: Secondary | ICD-10-CM | POA: Diagnosis not present

## 2016-10-07 DIAGNOSIS — J309 Allergic rhinitis, unspecified: Secondary | ICD-10-CM | POA: Diagnosis not present

## 2016-10-07 DIAGNOSIS — R220 Localized swelling, mass and lump, head: Secondary | ICD-10-CM | POA: Diagnosis not present

## 2016-10-07 DIAGNOSIS — K148 Other diseases of tongue: Secondary | ICD-10-CM | POA: Insufficient documentation

## 2016-10-07 MED ORDER — METHYLPREDNISOLONE ACETATE 80 MG/ML IJ SUSP
80.0000 mg | Freq: Once | INTRAMUSCULAR | Status: AC
  Administered 2016-10-07: 80 mg via INTRAMUSCULAR

## 2016-10-07 MED ORDER — TRIAMCINOLONE ACETONIDE 55 MCG/ACT NA AERO: 2.0000 | INHALATION_SPRAY | Freq: Every day | NASAL | 12 refills | Status: DC

## 2016-10-07 MED ORDER — AMOXICILLIN 500 MG PO CAPS: 1000.0000 mg | ORAL_CAPSULE | Freq: Two times a day (BID) | ORAL | 0 refills | Status: DC

## 2016-10-07 MED ORDER — CETIRIZINE HCL 10 MG PO TABS: 10.0000 mg | ORAL_TABLET | Freq: Every day | ORAL | 11 refills | Status: DC

## 2016-10-07 NOTE — Assessment & Plan Note (Addendum)
Mild to mod, for depomedrol IM 80, for zyrtec and nasacort asd,  to f/u any worsening symptoms or concerns

## 2016-10-07 NOTE — Progress Notes (Signed)
Subjective:    Patient ID: Nicolas Cantu, male    DOB: 1965/10/03, 51 y.o.   MRN: [id number]  HPI   Here with 2-3 days acute onset fever, facial pain, pressure, headache, general weakness and malaise, and greenish d/c, with mild ST and cough, but pt denies chest pain, wheezing, increased sob or doe, orthopnea, PND, increased LE swelling, palpitations, dizziness or syncope.  Has also noted a mass to the base of the tongue in the midline for 1 mo that has not resolved.  Also, Does have several wks ongoing nasal allergy symptoms with clearish congestion, itch and sneezing, without fever, pain, ST, cough, swelling or wheezing., but also with bilat eye itching and burning with dc.  No fever with this aspect  No other hx Past Medical History:  Diagnosis Date  . Abdominal pain   . Allergic rhinitis, cause unspecified 02/26/2011  . Allergy   . Asthma 02/26/2011  . Colon polyp   . Diabetes mellitus    ?  Marland Kitchen DIABETES MELLITUS, UNCONTROLLED 02/17/2009  . Diarrhea 02/17/2009  . HEMATOCHEZIA 02/17/2009  . Hepatitis    hx  . History of syphilis 08/24/2012   Remote tx  . HYPERLIPIDEMIA 02/17/2009  . LEG PAIN, BILATERAL 02/17/2009  . Lung cancer ([place]) 2013   LUL  . Lung mass   . OSTEOARTHRITIS, KNEES, BILATERAL 05/14/2010  . Polyuria 03/24/2010  . Preventative health care 11/15/2010  . Shortness of breath   . Tuberculosis    hx of tb 93 tx   Past Surgical History:  Procedure Laterality Date  . BREATH TEK H PYLORI N/A 04/19/2013   Procedure: BREATH TEK H PYLORI;  Surgeon: Jerene Bears, MD;  Location: [facility];  Service: Gastroenterology;  Laterality: N/A;  . COLONOSCOPY    . ESOPHAGOGASTRODUODENOSCOPY    . LUNG REMOVAL, PARTIAL  10-18-2011   LUL    reports that he has never smoked. He has never used smokeless tobacco. He reports that he does not drink alcohol or use drugs. family history is not on file. No Known Allergies Current Outpatient Prescriptions on File Prior to Visit  Medication Sig  Dispense Refill  . albuterol (PROVENTIL HFA;VENTOLIN HFA) 108 (90 Base) MCG/ACT inhaler Inhale 2 puffs into the lungs every 6 (six) hours as needed for wheezing or shortness of breath. 1 Inhaler 5  . aspirin 81 MG EC tablet Take 81 mg by mouth daily.      Marland Kitchen atorvastatin (LIPITOR) 40 MG tablet Take 1 tablet (40 mg total) by mouth daily. 90 tablet 3  . mefloquine (LARIAM) 250 MG tablet Take 1 tablet (250 mg total) by mouth every 7 (seven) days. 5 tablet 0   No current facility-administered medications on file prior to visit.    Review of Systems  Constitutional: Negative for other unusual diaphoresis or sweats HENT: Negative for ear discharge or swelling Eyes: Negative for other worsening visual disturbances Respiratory: Negative for stridor or other swelling  Gastrointestinal: Negative for worsening distension or other blood Genitourinary: Negative for retention or other urinary change Musculoskeletal: Negative for other MSK pain or swelling Skin: Negative for color change or other new lesions Neurological: Negative for worsening tremors and other numbness  Psychiatric/Behavioral: Negative for worsening agitation or other fatigue All other system neg per pt    Objective:   Physical Exam BP 102/66   Pulse 80   Ht '5\' 3"'$  (1.6 m)   Wt 149 lb (67.6 kg)   SpO2 98%   BMI 26.39 kg/m  VS noted, mild ill Constitutional: Pt appears in NAD HENT: Head: NCAT.  Right Ear: External ear normal.  Left Ear: External ear normal.  Tongue with < 1/2 cm enlarged erythem NT mass to base of tongue in the midline Bilat tm's with mild erythema.  Max sinus areas mild tender.  Pharynx with mild erythema, no exudate Eyes: . Pupils are equal, round, and reactive to light. Conjunctivae and EOM are normal Nose: without d/c or deformity Neck: Neck supple. Gross normal ROM Cardiovascular: Normal rate and regular rhythm.   Pulmonary/Chest: Effort normal and breath sounds without rales or wheezing.    Neurological: Pt is alert. At baseline orientation, motor grossly intact Skin: Skin is warm. No rashes, other new lesions, no LE edema Psychiatric: Pt behavior is normal without agitation  No other exam findings    Assessment & Plan:

## 2016-10-07 NOTE — Assessment & Plan Note (Signed)
Also ok for otc zaditor asd,  to f/u any worsening symptoms or concerns

## 2016-10-07 NOTE — Progress Notes (Signed)
Pre visit review using our clinic review tool, if applicable. No additional management support is needed unless otherwise documented below in the visit note. 

## 2016-10-07 NOTE — Addendum Note (Signed)
Addended by: Juliet Rude on: 10/07/2016 04:37 PM   Modules accepted: Orders

## 2016-10-07 NOTE — Assessment & Plan Note (Signed)
Mild to mod, for antibx course,  to f/u any worsening symptoms or concerns 

## 2016-10-07 NOTE — Assessment & Plan Note (Signed)
?   Significance, cant r/o malignancy - for ENT referral

## 2016-10-07 NOTE — Patient Instructions (Signed)
You had the steroid shot today  Please take all new medication as prescribed - the antibiotic  Please take all new medication as prescribed - the zyrtec and Nasacort for allergies  You can also use OTC Zaditor as eye drops for allergies as well  You will be contacted regarding the referral for: ENT for the base of the tongue swelling  Please continue all other medications as before, and refills have been done if requested.  Please have the pharmacy call with any other refills you may need.  Please keep your appointments with your specialists as you may have planned

## 2016-11-25 ENCOUNTER — Encounter: Payer: Self-pay | Admitting: Internal Medicine

## 2017-03-09 ENCOUNTER — Encounter: Payer: Self-pay | Admitting: Internal Medicine

## 2017-04-13 ENCOUNTER — Other Ambulatory Visit (INDEPENDENT_AMBULATORY_CARE_PROVIDER_SITE_OTHER): Payer: Managed Care, Other (non HMO)

## 2017-04-13 ENCOUNTER — Telehealth: Payer: Self-pay

## 2017-04-13 DIAGNOSIS — Z0000 Encounter for general adult medical examination without abnormal findings: Secondary | ICD-10-CM | POA: Diagnosis not present

## 2017-04-13 LAB — URINALYSIS, ROUTINE W REFLEX MICROSCOPIC
Bilirubin Urine: NEGATIVE
Hgb urine dipstick: NEGATIVE
Ketones, ur: NEGATIVE
Leukocytes, UA: NEGATIVE
NITRITE: NEGATIVE
RBC / HPF: NONE SEEN (ref 0–?)
Total Protein, Urine: NEGATIVE
URINE GLUCOSE: NEGATIVE
Urobilinogen, UA: 0.2 (ref 0.0–1.0)
WBC, UA: NONE SEEN (ref 0–?)
pH: 6 (ref 5.0–8.0)

## 2017-04-13 LAB — CBC WITH DIFFERENTIAL/PLATELET
BASOS PCT: 0.4 % (ref 0.0–3.0)
Basophils Absolute: 0 10*3/uL (ref 0.0–0.1)
EOS PCT: 1.2 % (ref 0.0–5.0)
Eosinophils Absolute: 0.1 10*3/uL (ref 0.0–0.7)
HEMATOCRIT: 41.3 % (ref 39.0–52.0)
HEMOGLOBIN: 13.7 g/dL (ref 13.0–17.0)
LYMPHS PCT: 39.9 % (ref 12.0–46.0)
Lymphs Abs: 2.2 10*3/uL (ref 0.7–4.0)
MCHC: 33.2 g/dL (ref 30.0–36.0)
MCV: 88.4 fl (ref 78.0–100.0)
Monocytes Absolute: 0.4 10*3/uL (ref 0.1–1.0)
Monocytes Relative: 7.1 % (ref 3.0–12.0)
Neutro Abs: 2.9 10*3/uL (ref 1.4–7.7)
Neutrophils Relative %: 51.4 % (ref 43.0–77.0)
Platelets: 240 10*3/uL (ref 150.0–400.0)
RBC: 4.67 Mil/uL (ref 4.22–5.81)
RDW: 12.2 % (ref 11.5–15.5)
WBC: 5.6 10*3/uL (ref 4.0–10.5)

## 2017-04-13 LAB — TSH: TSH: 0.9 u[IU]/mL (ref 0.35–4.50)

## 2017-04-13 LAB — BASIC METABOLIC PANEL
BUN: 15 mg/dL (ref 6–23)
CHLORIDE: 99 meq/L (ref 96–112)
CO2: 32 mEq/L (ref 19–32)
Calcium: 9.6 mg/dL (ref 8.4–10.5)
Creatinine, Ser: 0.96 mg/dL (ref 0.40–1.50)
GFR: 87.11 mL/min (ref >60.00)
Glucose, Bld: 103 mg/dL — ABNORMAL HIGH (ref 70–99)
POTASSIUM: 3.8 meq/L (ref 3.5–5.1)
SODIUM: 138 meq/L (ref 135–145)

## 2017-04-13 LAB — HEPATIC FUNCTION PANEL
ALK PHOS: 83 U/L (ref 39–117)
ALT: 26 U/L (ref 0–53)
AST: 21 U/L (ref 0–37)
Albumin: 4.3 g/dL (ref 3.5–5.2)
BILIRUBIN DIRECT: 0.1 mg/dL (ref 0.0–0.3)
BILIRUBIN TOTAL: 0.9 mg/dL (ref 0.2–1.2)
Total Protein: 7.2 g/dL (ref 6.0–8.3)

## 2017-04-13 LAB — LIPID PANEL
CHOL/HDL RATIO: 5
CHOLESTEROL: 263 mg/dL — AB (ref 0–200)
HDL: 50.4 mg/dL (ref >39.00)
NONHDL: 212.55
Triglycerides: 263 mg/dL — ABNORMAL HIGH (ref 0.0–149.0)
VLDL: 52.6 mg/dL — ABNORMAL HIGH (ref 0.0–40.0)

## 2017-04-13 LAB — PSA: PSA: 0.45 ng/mL (ref 0.10–4.00)

## 2017-04-13 LAB — LDL CHOLESTEROL, DIRECT: Direct LDL: 146 mg/dL

## 2017-04-13 NOTE — Telephone Encounter (Signed)
Labs had expired. New orders needed for upcoming appt.

## 2017-04-14 ENCOUNTER — Encounter: Payer: Self-pay | Admitting: Internal Medicine

## 2017-04-14 ENCOUNTER — Ambulatory Visit (INDEPENDENT_AMBULATORY_CARE_PROVIDER_SITE_OTHER): Payer: Managed Care, Other (non HMO) | Admitting: Internal Medicine

## 2017-04-14 VITALS — BP 116/72 | HR 104 | Temp 98.4°F | Ht 63.0 in | Wt 151.0 lb

## 2017-04-14 DIAGNOSIS — Z0001 Encounter for general adult medical examination with abnormal findings: Secondary | ICD-10-CM

## 2017-04-14 DIAGNOSIS — E785 Hyperlipidemia, unspecified: Secondary | ICD-10-CM | POA: Diagnosis not present

## 2017-04-14 DIAGNOSIS — J309 Allergic rhinitis, unspecified: Secondary | ICD-10-CM

## 2017-04-14 DIAGNOSIS — Z23 Encounter for immunization: Secondary | ICD-10-CM | POA: Diagnosis not present

## 2017-04-14 DIAGNOSIS — R7302 Impaired glucose tolerance (oral): Secondary | ICD-10-CM | POA: Diagnosis not present

## 2017-04-14 DIAGNOSIS — K121 Other forms of stomatitis: Secondary | ICD-10-CM | POA: Insufficient documentation

## 2017-04-14 MED ORDER — TRIAMCINOLONE ACETONIDE 0.1 % MT PSTE: 1.0000 "application " | PASTE | Freq: Two times a day (BID) | OROMUCOSAL | 12 refills | Status: DC

## 2017-04-14 MED ORDER — CETIRIZINE HCL 10 MG PO TABS: 10.0000 mg | ORAL_TABLET | Freq: Every day | ORAL | 3 refills | Status: AC

## 2017-04-14 MED ORDER — ATORVASTATIN CALCIUM 80 MG PO TABS: 80.0000 mg | ORAL_TABLET | Freq: Every day | ORAL | 3 refills | Status: DC

## 2017-04-14 MED ORDER — ATORVASTATIN CALCIUM 40 MG PO TABS: 40.0000 mg | ORAL_TABLET | Freq: Every day | ORAL | 3 refills | Status: DC

## 2017-04-14 MED ORDER — TRIAMCINOLONE ACETONIDE 55 MCG/ACT NA AERO: 2.0000 | INHALATION_SPRAY | Freq: Every day | NASAL | 12 refills | Status: DC

## 2017-04-14 NOTE — Assessment & Plan Note (Signed)

## 2017-04-14 NOTE — Assessment & Plan Note (Signed)
Asympt, stable overall by history and exam, recent data reviewed with pt, and pt to continue medical treatment as before,  to f/u any worsening symptoms or concerns Lab Results  Component Value Date   HGBA1C 6.2 02/11/2016

## 2017-04-14 NOTE — Assessment & Plan Note (Signed)
Mild uncontrolled, for lower cholesterol diet, ok to increase the lipitor 80 qd

## 2017-04-14 NOTE — Assessment & Plan Note (Addendum)
Etiology unclear, recurrent per pt - for kenalog in orabase  In addition to the time spent performing CPE, I spent an additional 15 minutes face to face,in which greater than 50% of this time was spent in counseling and coordination of care for patient's illness as documented, including the differential dx, tx, further evaluation and other management of mouth ulceration, allergic rhinitis, HTN, Hyperglycemia

## 2017-04-14 NOTE — Progress Notes (Signed)
Subjective:    Patient ID: Nicolas Cantu, male    DOB: [birth year], 51 y.o.   MRN: [id number]  HPI  Here for wellness and f/u;  Overall doing ok;  Pt denies Chest pain, worsening SOB, DOE, wheezing, orthopnea, PND, worsening LE edema, palpitations, dizziness or syncope.  Pt denies neurological change such as new headache, facial or extremity weakness.  Pt denies polydipsia, polyuria, or low sugar symptoms. Pt states overall good compliance with treatment and medications, good tolerability, and has been trying to follow appropriate diet.  Pt denies worsening depressive symptoms, suicidal ideation or panic. No fever, night sweats, wt loss, loss of appetite, or other constitutional symptoms.  Pt states good ability with ADL's, has low fall risk, home safety reviewed and adequate, no other significant changes in hearing or vision, and only occasionally active with exercise.  Has eye exam scheduled dec 4.  Having trouble with glare outside, wears sunglasses often.  Does have 2-3 wks onset sore ulceration type lesion just under the tongue, without fever, trauma, hot food or other exposure.  Does have several wks ongoing nasal allergy symptoms with clearish congestion, itch and sneezing, without fever, pain, ST, cough, swelling or wheezing.  No other interval hx Past Medical History:  Diagnosis Date  . Abdominal pain   . Allergic rhinitis, cause unspecified 02/26/2011  . Allergy   . Asthma 02/26/2011  . Colon polyp   . Diabetes mellitus    ?  Marland Kitchen DIABETES MELLITUS, UNCONTROLLED 02/17/2009  . Diarrhea 02/17/2009  . HEMATOCHEZIA 02/17/2009  . Hepatitis    hx  . History of syphilis 08/24/2012   Remote tx  . HYPERLIPIDEMIA 02/17/2009  . LEG PAIN, BILATERAL 02/17/2009  . Lung cancer ([place]) 2013   LUL  . Lung mass   . OSTEOARTHRITIS, KNEES, BILATERAL 05/14/2010  . Polyuria 03/24/2010  . Preventative health care 11/15/2010  . Shortness of breath   . Tuberculosis    hx of tb 93 tx   Past Surgical History:    Procedure Laterality Date  . COLONOSCOPY    . ESOPHAGOGASTRODUODENOSCOPY    . LUNG REMOVAL, PARTIAL  10-18-2011   LUL    reports that  has never smoked. he has never used smokeless tobacco. He reports that he does not drink alcohol or use drugs. family history is not on file. No Known Allergies Current Outpatient Medications on File Prior to Visit  Medication Sig Dispense Refill  . albuterol (PROVENTIL HFA;VENTOLIN HFA) 108 (90 Base) MCG/ACT inhaler Inhale 2 puffs into the lungs every 6 (six) hours as needed for wheezing or shortness of breath. 1 Inhaler 5  . aspirin 81 MG EC tablet Take 81 mg by mouth daily.       No current facility-administered medications on file prior to visit.    Review of Systems Constitutional: Negative for other unusual diaphoresis, sweats, appetite or weight changes HENT: Negative for other worsening hearing loss, ear pain, facial swelling, mouth sores or neck stiffness.   Eyes: Negative for other worsening pain, redness or other visual disturbance.  Respiratory: Negative for other stridor or swelling Cardiovascular: Negative for other palpitations or other chest pain  Gastrointestinal: Negative for worsening diarrhea or loose stools, blood in stool, distention or other pain Genitourinary: Negative for hematuria, flank pain or other change in urine volume.  Musculoskeletal: Negative for myalgias or other joint swelling.  Skin: Negative for other color change, or other wound or worsening drainage.  Neurological: Negative for other syncope or numbness. Hematological: Negative  for other adenopathy or swelling Psychiatric/Behavioral: Negative for hallucinations, other worsening agitation, SI, self-injury, or new decreased concentration All other system neg     Objective:   Physical Exam BP 116/72   Pulse (!) 104   Temp 98.4 F (36.9 C) (Oral)   Ht 5\' 3"  (1.6 m)   Wt 151 lb (68.5 kg)   SpO2 100%   BMI 26.75 kg/m  VS noted, not ill  appearing Constitutional: Pt is oriented to person, place, and time. Appears well-developed and well-nourished, in no significant distress and comfortable Tongue with 5 mm ulceration at the frenulam, mild tender Bilat tm's with mild erythema.  Max sinus areas non tender.  Pharynx with mild erythema, no exudate Head: Normocephalic and atraumatic  Eyes: Conjunctivae and EOM are normal. Pupils are equal, round, and reactive to light Right Ear: External ear normal without discharge Left Ear: External ear normal without discharge Nose: Nose without discharge or deformity Mouth/Throat: Oropharynx is without other ulcerations and moist  Neck: Normal range of motion. Neck supple. No JVD present. No tracheal deviation present or significant neck LA or mass Cardiovascular: Normal rate, regular rhythm, normal heart sounds and intact distal pulses.   Pulmonary/Chest: WOB normal and breath sounds without rales or wheezing  Abdominal: Soft. Bowel sounds are normal. NT. No HSM  Musculoskeletal: Normal range of motion. Exhibits no edema Lymphadenopathy: Has no other cervical adenopathy.  Neurological: Pt is alert and oriented to person, place, and time. Pt has normal reflexes. No cranial nerve deficit. Motor grossly intact, Gait intact Skin: Skin is warm and dry. No rash noted or new ulcerations Psychiatric:  Has normal mood and affect. Behavior is normal without agitation No other exam findings Lab Results  Component Value Date   WBC 5.6 04/13/2017   HGB 13.7 04/13/2017   HCT 41.3 04/13/2017   PLT 240.0 04/13/2017   GLUCOSE 103 (H) 04/13/2017   CHOL 263 (H) 04/13/2017   TRIG 263.0 (H) 04/13/2017   HDL 50.40 04/13/2017   LDLDIRECT 146.0 04/13/2017   LDLCALC 75 02/11/2016   ALT 26 04/13/2017   AST 21 04/13/2017   NA 138 04/13/2017   K 3.8 04/13/2017   CL 99 04/13/2017   CREATININE 0.96 04/13/2017   BUN 15 04/13/2017   CO2 32 04/13/2017   TSH 0.90 04/13/2017   PSA 0.45 04/13/2017   INR 0.93  10/15/2011   HGBA1C 6.2 02/11/2016   MICROALBUR 0.5 03/16/2012       Assessment & Plan:

## 2017-04-14 NOTE — Assessment & Plan Note (Signed)
Mild to mod, for zyrtec and nasacort asd,,  to f/u any worsening symptoms or concerns

## 2017-04-14 NOTE — Patient Instructions (Addendum)
You had the flu shot today  Please take all new medication as prescribed - the kenalog in orabase for the tongue issue  OK to increase the lipitor to 80 mg per day  Please continue all other medications as before, and refills have been done if requested - including the zyrtec and nasacort for the allergies  Please have the pharmacy call with any other refills you may need.  Please continue your efforts at being more active, low cholesterol diet, and weight control.  You are otherwise up to date with prevention measures today.  Please keep your appointments with your specialists as you may have planned  Please return in 1 year for your yearly visit, or sooner if needed, with Lab testing done 3-5 days before

## 2018-04-18 ENCOUNTER — Encounter: Payer: Managed Care, Other (non HMO) | Admitting: Internal Medicine

## 2018-06-19 LAB — HM DIABETES EYE EXAM

## 2018-06-23 ENCOUNTER — Encounter: Payer: Self-pay | Admitting: Internal Medicine

## 2018-06-23 ENCOUNTER — Telehealth: Payer: Self-pay

## 2018-06-23 ENCOUNTER — Other Ambulatory Visit (INDEPENDENT_AMBULATORY_CARE_PROVIDER_SITE_OTHER): Payer: No Typology Code available for payment source

## 2018-06-23 ENCOUNTER — Ambulatory Visit (INDEPENDENT_AMBULATORY_CARE_PROVIDER_SITE_OTHER): Payer: No Typology Code available for payment source | Admitting: Internal Medicine

## 2018-06-23 VITALS — BP 106/68 | HR 92 | Temp 98.2°F | Ht 63.0 in | Wt 151.0 lb

## 2018-06-23 DIAGNOSIS — Z0000 Encounter for general adult medical examination without abnormal findings: Secondary | ICD-10-CM

## 2018-06-23 DIAGNOSIS — G4700 Insomnia, unspecified: Secondary | ICD-10-CM

## 2018-06-23 DIAGNOSIS — R202 Paresthesia of skin: Secondary | ICD-10-CM | POA: Diagnosis not present

## 2018-06-23 DIAGNOSIS — Z23 Encounter for immunization: Secondary | ICD-10-CM | POA: Diagnosis not present

## 2018-06-23 DIAGNOSIS — R7302 Impaired glucose tolerance (oral): Secondary | ICD-10-CM | POA: Diagnosis not present

## 2018-06-23 DIAGNOSIS — Z125 Encounter for screening for malignant neoplasm of prostate: Secondary | ICD-10-CM | POA: Diagnosis not present

## 2018-06-23 DIAGNOSIS — K068 Other specified disorders of gingiva and edentulous alveolar ridge: Secondary | ICD-10-CM

## 2018-06-23 DIAGNOSIS — M79602 Pain in left arm: Secondary | ICD-10-CM | POA: Insufficient documentation

## 2018-06-23 DIAGNOSIS — Z0001 Encounter for general adult medical examination with abnormal findings: Secondary | ICD-10-CM

## 2018-06-23 LAB — URINALYSIS, ROUTINE W REFLEX MICROSCOPIC
Bilirubin Urine: NEGATIVE
Hgb urine dipstick: NEGATIVE
Ketones, ur: NEGATIVE
Leukocytes, UA: NEGATIVE
NITRITE: NEGATIVE
RBC / HPF: NONE SEEN (ref 0–?)
Specific Gravity, Urine: <=1.005 — AB (ref 1.000–1.030)
Total Protein, Urine: NEGATIVE
Urine Glucose: NEGATIVE
Urobilinogen, UA: 0.2 (ref 0.0–1.0)
pH: 6 (ref 5.0–8.0)

## 2018-06-23 LAB — HEPATIC FUNCTION PANEL
ALBUMIN: 4.6 g/dL (ref 3.5–5.2)
ALT: 29 U/L (ref 0–53)
AST: 23 U/L (ref 0–37)
Alkaline Phosphatase: 98 U/L (ref 39–117)
Bilirubin, Direct: 0.2 mg/dL (ref 0.0–0.3)
TOTAL PROTEIN: 7.5 g/dL (ref 6.0–8.3)
Total Bilirubin: 0.9 mg/dL (ref 0.2–1.2)

## 2018-06-23 LAB — LIPID PANEL
CHOLESTEROL: 197 mg/dL (ref 0–200)
HDL: 66.5 mg/dL (ref >39.00)
LDL Cholesterol: 105 mg/dL — ABNORMAL HIGH (ref 0–99)
NonHDL: 130.74
TRIGLYCERIDES: 131 mg/dL (ref 0.0–149.0)
Total CHOL/HDL Ratio: 3
VLDL: 26.2 mg/dL (ref 0.0–40.0)

## 2018-06-23 LAB — CBC WITH DIFFERENTIAL/PLATELET
Basophils Absolute: 0 10*3/uL (ref 0.0–0.1)
Basophils Relative: 0.6 % (ref 0.0–3.0)
EOS ABS: 0.1 10*3/uL (ref 0.0–0.7)
Eosinophils Relative: 1.7 % (ref 0.0–5.0)
HCT: 43.9 % (ref 39.0–52.0)
Hemoglobin: 14.8 g/dL (ref 13.0–17.0)
Lymphocytes Relative: 42 % (ref 12.0–46.0)
Lymphs Abs: 2.2 10*3/uL (ref 0.7–4.0)
MCHC: 33.8 g/dL (ref 30.0–36.0)
MCV: 87.7 fl (ref 78.0–100.0)
Monocytes Absolute: 0.4 10*3/uL (ref 0.1–1.0)
Monocytes Relative: 7 % (ref 3.0–12.0)
Neutro Abs: 2.5 10*3/uL (ref 1.4–7.7)
Neutrophils Relative %: 48.7 % (ref 43.0–77.0)
Platelets: 256 10*3/uL (ref 150.0–400.0)
RBC: 5.01 Mil/uL (ref 4.22–5.81)
RDW: 12.3 % (ref 11.5–15.5)
WBC: 5.2 10*3/uL (ref 4.0–10.5)

## 2018-06-23 LAB — BASIC METABOLIC PANEL
BUN: 17 mg/dL (ref 6–23)
CO2: 27 mEq/L (ref 19–32)
Calcium: 9.8 mg/dL (ref 8.4–10.5)
Chloride: 100 mEq/L (ref 96–112)
Creatinine, Ser: 0.85 mg/dL (ref 0.40–1.50)
GFR: 93.89 mL/min (ref >60.00)
Glucose, Bld: 98 mg/dL (ref 70–99)
Potassium: 3.9 mEq/L (ref 3.5–5.1)
Sodium: 137 mEq/L (ref 135–145)

## 2018-06-23 LAB — TSH: TSH: 0.55 u[IU]/mL (ref 0.35–4.50)

## 2018-06-23 LAB — PSA: PSA: 0.41 ng/mL (ref 0.10–4.00)

## 2018-06-23 MED ORDER — ZOLPIDEM TARTRATE 10 MG PO TABS: 10.0000 mg | ORAL_TABLET | Freq: Every evening | ORAL | 1 refills | Status: DC | PRN

## 2018-06-23 MED ORDER — NAPROXEN 500 MG PO TABS: 500.0000 mg | ORAL_TABLET | Freq: Two times a day (BID) | ORAL | 5 refills | Status: DC | PRN

## 2018-06-23 NOTE — Telephone Encounter (Signed)
Orders has expired. New orders needed per lab.

## 2018-06-23 NOTE — Progress Notes (Signed)
Subjective:    Patient ID: Nicolas Cantu, male    DOB: 1966/01/22, 53 y.o.   MRN: [id number]  HPI  Here for wellness and f/u;  Overall doing ok;  Pt denies Chest pain, worsening SOB, DOE, wheezing, orthopnea, PND, worsening LE edema, palpitations, dizziness or syncope.  Pt denies neurological change such as new headache, facial or extremity weakness.  Pt denies polydipsia, polyuria, or low sugar symptoms. Pt states overall good compliance with treatment and medications, good tolerability, and has been trying to follow appropriate diet.  Pt denies worsening depressive symptoms, suicidal ideation or panic. No fever, night sweats, wt loss, loss of appetite, or other constitutional symptoms.  Pt states good ability with ADL's, has low fall risk, home safety reviewed and adequate, no other significant changes in hearing or vision, and only occasionally active with exercise. Divorced from nancy who is also a pt here, just more stress lately, over 4 children. 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  Also with increased difficulty most nights getting to sleep and staying asleep, thinks maybe due to stressors,  Ongoing persistent for 2 mo.  Also has a fleshy nontender oral lesion to right upper mid gumline near a molar with a cap, thinks it may be simply a reaction to an ill fitting cap next to the gum.  Has not changed in size in several months, and non ulcerated, no pain.  Pt also c/o distal LUE intermittent numbness without pain or weakness, worse in the AM, has to shake the hand to make it better. Past Medical History:  Diagnosis Date  . Abdominal pain   . Allergic rhinitis, cause unspecified 02/26/2011  . Allergy   . Asthma 02/26/2011  . Colon polyp   . Diabetes mellitus    ?  Marland Kitchen DIABETES MELLITUS, UNCONTROLLED 02/17/2009  .  Diarrhea 02/17/2009  . HEMATOCHEZIA 02/17/2009  . Hepatitis    hx  . History of syphilis 08/24/2012   Remote tx  . HYPERLIPIDEMIA 02/17/2009  . LEG PAIN, BILATERAL 02/17/2009  . Lung cancer ([place]) 2013   LUL  . Lung mass   . OSTEOARTHRITIS, KNEES, BILATERAL 05/14/2010  . Polyuria 03/24/2010  . Preventative health care 11/15/2010  . Shortness of breath   . Tuberculosis    hx of tb 93 tx   Past Surgical History:  Procedure Laterality Date  . BREATH TEK H PYLORI N/A 04/19/2013   Procedure: BREATH TEK H PYLORI;  Surgeon: Jerene Bears, MD;  Location: [facility];  Service: Gastroenterology;  Laterality: N/A;  . COLONOSCOPY    . ESOPHAGOGASTRODUODENOSCOPY    . LUNG REMOVAL, PARTIAL  10-18-2011   LUL    reports that he has never smoked. He has never used smokeless tobacco. He reports that he does not drink alcohol or use drugs. family history is not on file. No Known Allergies Current Outpatient Medications on File Prior to Visit  Medication Sig Dispense Refill  . albuterol (PROVENTIL HFA;VENTOLIN HFA) 108 (90 Base) MCG/ACT inhaler Inhale 2 puffs into the lungs every 6 (six) hours as needed for wheezing or shortness of breath. 1 Inhaler 5  . aspirin 81 MG EC tablet Take 81 mg by mouth daily.      Marland Kitchen atorvastatin (LIPITOR) 80 MG tablet Take 1 tablet (80 mg total) daily by mouth. 90 tablet  3  . cetirizine (ZYRTEC) 10 MG tablet Take 1 tablet (10 mg total) daily by mouth. 90 tablet 3  . triamcinolone (KENALOG) 0.1 % paste Use as directed 1 application 2 (two) times daily in the mouth or throat. 5 g 12  . triamcinolone (NASACORT AQ) 55 MCG/ACT AERO nasal inhaler Place 2 sprays daily into the nose. 1 Inhaler 12   No current facility-administered medications on file prior to visit.    Review of Systems Constitutional: Negative for other unusual diaphoresis, sweats, appetite or weight changes HENT: Negative for other worsening hearing loss, ear pain, facial swelling, mouth sores or neck stiffness.    Eyes: Negative for other worsening pain, redness or other visual disturbance.  Respiratory: Negative for other stridor or swelling Cardiovascular: Negative for other palpitations or other chest pain  Gastrointestinal: Negative for worsening diarrhea or loose stools, blood in stool, distention or other pain Genitourinary: Negative for hematuria, flank pain or other change in urine volume.  Musculoskeletal: Negative for myalgias or other joint swelling.  Skin: Negative for other color change, or other wound or worsening drainage.  Neurological: Negative for other syncope or numbness. Hematological: Negative for other adenopathy or swelling Psychiatric/Behavioral: Negative for hallucinations, other worsening agitation, SI, self-injury, or new decreased concentration All other system neg per pt    Objective:   Physical Exam BP 106/68   Pulse 92   Temp 98.2 F (36.8 C) (Oral)   Ht 5\' 3"  (1.6 m)   Wt 151 lb (68.5 kg)   SpO2 94%   BMI 26.75 kg/m  VS noted,  Constitutional: Pt is oriented to person, place, and time. Appears well-developed and well-nourished, in no significant distress and comfortable Head: Normocephalic and atraumatic  Eyes: Conjunctivae and EOM are normal. Pupils are equal, round, and reactive to light Right Ear: External ear normal without discharge Left Ear: External ear normal without discharge Nose: Nose without discharge or deformity Mouth/Throat: Oropharynx is without other ulcerations and moist , has about 5 mm raised fleshy normal colored area of tissue mid right upper gum line near a cap, without ulcer or tenderness Neck: Normal range of motion. Neck supple. No JVD present. No tracheal deviation present or significant neck LA or mass Cardiovascular: Normal rate, regular rhythm, normal heart sounds and intact distal pulses.   Pulmonary/Chest: WOB normal and breath sounds without rales or wheezing  Abdominal: Soft. Bowel sounds are normal. NT. No HSM    Musculoskeletal: Normal range of motion. Exhibits no edema, tender area to left lateral bicep insertion site without swelling or skin change Lymphadenopathy: Has no other cervical adenopathy.  Neurological: Pt is alert and oriented to person, place, and time. Pt has normal reflexes. No cranial nerve deficit. Motor grossly intact, Gait intact Skin: Skin is warm and dry. No rash noted or new ulcerations Psychiatric:  Has normal mood and affect. Behavior is normal without agitation No other exam findings  Lab Results  Component Value Date   WBC 5.6 04/13/2017   HGB 13.7 04/13/2017   HCT 41.3 04/13/2017   PLT 240.0 04/13/2017   GLUCOSE 103 (H) 04/13/2017   CHOL 263 (H) 04/13/2017   TRIG 263.0 (H) 04/13/2017   HDL 50.40 04/13/2017   LDLDIRECT 146.0 04/13/2017   LDLCALC 75 02/11/2016   ALT 26 04/13/2017   AST 21 04/13/2017   NA 138 04/13/2017   K 3.8 04/13/2017   CL 99 04/13/2017   CREATININE 0.96 04/13/2017   BUN 15 04/13/2017   CO2 32 04/13/2017  TSH 0.90 04/13/2017   PSA 0.45 04/13/2017   INR 0.93 10/15/2011   HGBA1C 6.2 02/11/2016   MICROALBUR 0.5 03/16/2012   Labs today still pending    Assessment & Plan:

## 2018-06-23 NOTE — Patient Instructions (Addendum)
You had the flu shot today  Please take all new medication as prescribed - the naproxen twice per day as needed for pain, and Ambien for sleep  Please use a left wrist splint at night only to help the morning numbness  Please continue all other medications as before, and refills have been done if requested.  Please have the pharmacy call with any other refills you may need.  Please continue your efforts at being more active, low cholesterol diet, and weight control.  You are otherwise up to date with prevention measures today.  Please keep your appointments with your specialists as you may have planned - Dental on Jan 20  Your lab work was done earlier today  You will be contacted by phone if any changes need to be made immediately.  Otherwise, you will receive a letter about your results with an explanation, but please check with MyChart first.  Please remember to sign up for MyChart if you have not done so, as this will be important to you in the future with finding out test results, communicating by private email, and scheduling acute appointments online when needed.  Please return in 1 year for your yearly visit, or sooner if needed, with Lab testing done 3-5 days before

## 2018-06-25 ENCOUNTER — Encounter: Payer: Self-pay | Admitting: Internal Medicine

## 2018-06-25 NOTE — Assessment & Plan Note (Signed)
Benign appearing, has f/u with dental jan 20, consider oral surgury referral for any worsening

## 2018-06-25 NOTE — Assessment & Plan Note (Signed)
C/w bicipital lasteral insertion site tendonitis at the elbow; for nsaid prn, avoid overuse, consider ortho if not improved

## 2018-06-25 NOTE — Assessment & Plan Note (Signed)
Ok for [facility] [facility] [employer] qhs prn, declines counseling for stress

## 2018-06-25 NOTE — Assessment & Plan Note (Signed)

## 2018-06-25 NOTE — Assessment & Plan Note (Addendum)
stable overall by history and exam, recent data reviewed with pt, and pt to continue medical treatment as before,  to f/u any worsening symptoms or concerns, for a1c with labs  In addition to the time spent performing CPE, I spent an additional 25 minutes face to face,in which greater than 50% of this time was spent in counseling and coordination of care for patient's acute illness as documented, including the differential dx, treatment, further evaluation and other management of hyperglycemia, left arm paresthesia, gum lesion, insomnia, and left arm pain

## 2018-06-25 NOTE — Assessment & Plan Note (Signed)
Also to LUE, but separate issue, most c/w mild CTS, and advised to wear left wrist splint at night until improved

## 2018-06-28 ENCOUNTER — Encounter: Payer: Self-pay | Admitting: Internal Medicine

## 2018-07-19 ENCOUNTER — Encounter: Payer: Self-pay | Admitting: *Deleted

## 2018-08-20 ENCOUNTER — Encounter: Payer: Self-pay | Admitting: Internal Medicine

## 2018-11-22 ENCOUNTER — Encounter: Payer: Self-pay | Admitting: Internal Medicine

## 2018-12-22 ENCOUNTER — Other Ambulatory Visit: Payer: Self-pay

## 2018-12-22 ENCOUNTER — Ambulatory Visit (AMBULATORY_SURGERY_CENTER): Payer: Self-pay | Admitting: *Deleted

## 2018-12-22 VITALS — Ht 63.0 in | Wt 150.0 lb

## 2018-12-22 DIAGNOSIS — Z86010 Personal history of colonic polyps: Secondary | ICD-10-CM

## 2018-12-22 MED ORDER — SUPREP BOWEL PREP KIT 17.5-3.13-1.6 GM/177ML PO SOLN: 1.0000 | Freq: Once | ORAL | 0 refills | Status: AC

## 2018-12-22 NOTE — Progress Notes (Signed)
No egg or soy allergy known to patient  No issues with past sedation with any surgeries  or procedures, no intubation problems  No diet pills per patient No home 02 use per patient  No blood thinners per patient  Pt denies issues with constipation  No A fib or A flutter  EMMI video sent to pt's e mail  In person PV today  Suprep $15 coupon to pt in PV today  Pt is aware that care partner will wait in the car during procedure; if they feel like they will be too hot to wait in the car; they may wait in the lobby.  We want them to wear a mask (we do not have any that we can provide them), practice social distancing, and we will check their temperatures when they get here.  I did remind patient that their care partner needs to stay in the parking lot the entire time. Pt will wear mask into building.

## 2019-01-04 ENCOUNTER — Telehealth: Payer: Self-pay | Admitting: Internal Medicine

## 2019-01-04 NOTE — Telephone Encounter (Signed)
Left message for patient regarding Covid-19 screening questions. Covid-19 Screening Questions:   Do you now or have you had a fever in the last 14 days?    Do you have any respiratory symptoms of shortness of breath or cough now or in the last 14 days?    Do you have any family members or close contacts with diagnosed or suspected Covid-19 in the past 14 days?    Have you been tested for Covid-19 and found to be positive?

## 2019-01-04 NOTE — Telephone Encounter (Signed)
No to all answers. °

## 2019-01-05 ENCOUNTER — Other Ambulatory Visit: Payer: Self-pay

## 2019-01-05 ENCOUNTER — Encounter: Payer: Self-pay | Admitting: Internal Medicine

## 2019-01-05 ENCOUNTER — Ambulatory Visit (AMBULATORY_SURGERY_CENTER): Payer: No Typology Code available for payment source | Admitting: Internal Medicine

## 2019-01-05 VITALS — BP 98/68 | HR 68 | Temp 98.2°F | Resp 15 | Ht 63.0 in | Wt 151.0 lb

## 2019-01-05 DIAGNOSIS — D124 Benign neoplasm of descending colon: Secondary | ICD-10-CM

## 2019-01-05 DIAGNOSIS — D128 Benign neoplasm of rectum: Secondary | ICD-10-CM

## 2019-01-05 DIAGNOSIS — D122 Benign neoplasm of ascending colon: Secondary | ICD-10-CM

## 2019-01-05 DIAGNOSIS — Z86010 Personal history of colonic polyps: Secondary | ICD-10-CM

## 2019-01-05 DIAGNOSIS — K621 Rectal polyp: Secondary | ICD-10-CM | POA: Diagnosis not present

## 2019-01-05 MED ORDER — SODIUM CHLORIDE 0.9 % IV SOLN: 500.0000 mL | Freq: Once | INTRAVENOUS | Status: DC

## 2019-01-05 NOTE — Op Note (Signed)
[place] Patient Name: Nicolas Cantu Procedure Date: 01/05/2019 8:47 AM MRN: [id number] Endoscopist: Jerene Bears , MD Age: 53 Referring MD:  Date of Birth: 14-Apr-1966 Gender: Male Account #: [id number] Procedure:                Colonoscopy Indications:              Surveillance: Personal history of adenomatous                            polyps on last colonoscopy 3 years ago Medicines:                Monitored Anesthesia Care Procedure:                Pre-Anesthesia Assessment:                           - Prior to the procedure, a History and Physical                            was performed, and patient medications and                            allergies were reviewed. The patient's tolerance of                            previous anesthesia was also reviewed. The risks                            and benefits of the procedure and the sedation                            options and risks were discussed with the patient.                            All questions were answered, and informed consent                            was obtained. Prior Anticoagulants: The patient has                            taken no previous anticoagulant or antiplatelet                            agents. ASA Grade Assessment: II - A patient with                            mild systemic disease. After reviewing the risks                            and benefits, the patient was deemed in                            satisfactory condition to undergo the procedure.  After obtaining informed consent, the colonoscope                            was passed under direct vision. Throughout the                            procedure, the patient's blood pressure, pulse, and                            oxygen saturations were monitored continuously. The                            Colonoscope was introduced through the anus and                            advanced to the cecum, identified  by appendiceal                            orifice and ileocecal valve. The colonoscopy was                            performed without difficulty. The patient tolerated                            the procedure well. The quality of the bowel                            preparation was good. The ileocecal valve,                            appendiceal orifice, and rectum were photographed. Scope In: 8:53:19 AM Scope Out: 9:04:58 AM Scope Withdrawal Time: 0 hours 10 minutes 43 seconds  Total Procedure Duration: 0 hours 11 minutes 39 seconds  Findings:                 The digital rectal exam was normal.                           Four sessile polyps were found in the ascending                            colon. The polyps were 2 to 4 mm in size. These                            polyps were removed with a cold snare. Resection                            and retrieval were complete.                           A 4 mm polyp was found in the descending colon. The                            polyp was sessile.  The polyp was removed with a                            cold snare. Resection and retrieval were complete.                           A 5 mm polyp was found in the rectum. The polyp was                            sessile. The polyp was removed with a cold snare.                            Resection and retrieval were complete.                           Internal hemorrhoids were found during                            retroflexion. The hemorrhoids were small. Complications:            No immediate complications. Estimated Blood Loss:     Estimated blood loss was minimal. Impression:               - Four 2 to 4 mm polyps in the ascending colon,                            removed with a cold snare. Resected and retrieved.                           - One 4 mm polyp in the descending colon, removed                            with a cold snare. Resected and retrieved.                           - One 5  mm polyp in the rectum, removed with a cold                            snare. Resected and retrieved.                           - Small internal hemorrhoids. Recommendation:           - Patient has a contact number available for                            emergencies. The signs and symptoms of potential                            delayed complications were discussed with the                            patient. Return to normal activities tomorrow.  Written discharge instructions were provided to the                            patient.                           - Resume previous diet.                           - Continue present medications.                           - Await pathology results.                           - Repeat colonoscopy is recommended for                            surveillance. The colonoscopy date will be                            determined after pathology results from today's                            exam become available for review. Jerene Bears, MD 01/05/2019 9:12:42 AM This report has been signed electronically.

## 2019-01-05 NOTE — Progress Notes (Signed)
To PACU, VSS. Report to [email or web address] 

## 2019-01-05 NOTE — Progress Notes (Signed)
Pt's states no medical or surgical changes since previsit or office visit.  Temp taken by JB VS taken by CW 

## 2019-01-05 NOTE — Progress Notes (Signed)
Called to room to assist during endoscopic procedure.  Patient ID and intended procedure confirmed with present staff. Received instructions for my participation in the procedure from the performing physician.  

## 2019-01-05 NOTE — Patient Instructions (Signed)
Impression/Recommendations:  Polyp handout given to patient. Hemorrhoid handout given to patient.  Resume previous diet. Continue present medications. Await pathology results. Repeat colonoscopy recommended for surveillance.  Date to be determined after pathology results reviewed.  YOU HAD AN ENDOSCOPIC PROCEDURE TODAY AT [facility]:   Refer to the procedure report that was given to you for any specific questions about what was found during the examination.  If the procedure report does not answer your questions, please call your gastroenterologist to clarify.  If you requested that your care partner not be given the details of your procedure findings, then the procedure report has been included in a sealed envelope for you to review at your convenience later.  YOU SHOULD EXPECT: Some feelings of bloating in the abdomen. Passage of more gas than usual.  Walking can help get rid of the air that was put into your GI tract during the procedure and reduce the bloating. If you had a lower endoscopy (such as a colonoscopy or flexible sigmoidoscopy) you may notice spotting of blood in your stool or on the toilet paper. If you underwent a bowel prep for your procedure, you may not have a normal bowel movement for a few days.  Please Note:  You might notice some irritation and congestion in your nose or some drainage.  This is from the oxygen used during your procedure.  There is no need for concern and it should clear up in a day or so.  SYMPTOMS TO REPORT IMMEDIATELY:   Following lower endoscopy (colonoscopy or flexible sigmoidoscopy):  Excessive amounts of blood in the stool  Significant tenderness or worsening of abdominal pains  Swelling of the abdomen that is new, acute  Fever of 100F or higher For urgent or emergent issues, a gastroenterologist can be reached at any hour by calling [phone].   DIET:  We do recommend a small meal at first, but then you may proceed to  your regular diet.  Drink plenty of fluids but you should avoid alcoholic beverages for 24 hours.  ACTIVITY:  You should plan to take it easy for the rest of today and you should NOT DRIVE or use heavy machinery until tomorrow (because of the sedation medicines used during the test).    FOLLOW UP: Our staff will call the number listed on your records 48-72 hours following your procedure to check on you and address any questions or concerns that you may have regarding the information given to you following your procedure. If we do not reach you, we will leave a message.  We will attempt to reach you two times.  During this call, we will ask if you have developed any symptoms of COVID 19. If you develop any symptoms (ie: fever, flu-like symptoms, shortness of breath, cough etc.) before then, please call [phone].  If you test positive for Covid 19 in the 2 weeks post procedure, please call and report this information to [place].    If any biopsies were taken you will be contacted by phone or by letter within the next 1-3 weeks.  Please call us at [phone] if you have not heard about the biopsies in 3 weeks.    SIGNATURES/CONFIDENTIALITY: You and/or your care partner have signed paperwork which will be entered into your electronic medical record.  These signatures attest to the fact that that the information above on your After Visit Summary has been reviewed and is understood.  Full responsibility of the confidentiality of this  discharge information lies with you and/or your care-partner.

## 2019-01-09 ENCOUNTER — Telehealth: Payer: Self-pay | Admitting: *Deleted

## 2019-01-09 ENCOUNTER — Telehealth: Payer: Self-pay

## 2019-01-09 NOTE — Telephone Encounter (Signed)
No answer, left message to call back later today, B.Toure Edmonds RN. 

## 2019-01-09 NOTE — Telephone Encounter (Signed)
  Follow up Call-  Call back number 01/05/2019  Post procedure Call Back phone  # [phone]  Permission to leave phone message Yes  Some recent data might be hidden     Patient questions:  Message left to call us if necessary. Call #2.

## 2019-01-11 ENCOUNTER — Encounter: Payer: Self-pay | Admitting: Internal Medicine

## 2019-06-25 ENCOUNTER — Encounter: Payer: No Typology Code available for payment source | Admitting: Internal Medicine

## 2019-06-25 DIAGNOSIS — Z0289 Encounter for other administrative examinations: Secondary | ICD-10-CM

## 2019-08-20 ENCOUNTER — Encounter: Payer: Self-pay | Admitting: Internal Medicine

## 2019-08-20 ENCOUNTER — Ambulatory Visit (INDEPENDENT_AMBULATORY_CARE_PROVIDER_SITE_OTHER): Payer: No Typology Code available for payment source

## 2019-08-20 ENCOUNTER — Ambulatory Visit (INDEPENDENT_AMBULATORY_CARE_PROVIDER_SITE_OTHER): Payer: No Typology Code available for payment source | Admitting: Internal Medicine

## 2019-08-20 ENCOUNTER — Other Ambulatory Visit: Payer: Self-pay

## 2019-08-20 VITALS — BP 118/72 | HR 88 | Temp 98.4°F | Ht 63.0 in | Wt 160.4 lb

## 2019-08-20 DIAGNOSIS — H02403 Unspecified ptosis of bilateral eyelids: Secondary | ICD-10-CM | POA: Diagnosis not present

## 2019-08-20 DIAGNOSIS — E538 Deficiency of other specified B group vitamins: Secondary | ICD-10-CM

## 2019-08-20 DIAGNOSIS — C3412 Malignant neoplasm of upper lobe, left bronchus or lung: Secondary | ICD-10-CM | POA: Diagnosis not present

## 2019-08-20 DIAGNOSIS — E611 Iron deficiency: Secondary | ICD-10-CM | POA: Diagnosis not present

## 2019-08-20 DIAGNOSIS — G4700 Insomnia, unspecified: Secondary | ICD-10-CM

## 2019-08-20 DIAGNOSIS — E559 Vitamin D deficiency, unspecified: Secondary | ICD-10-CM

## 2019-08-20 DIAGNOSIS — R7302 Impaired glucose tolerance (oral): Secondary | ICD-10-CM

## 2019-08-20 DIAGNOSIS — E785 Hyperlipidemia, unspecified: Secondary | ICD-10-CM

## 2019-08-20 DIAGNOSIS — Z0001 Encounter for general adult medical examination with abnormal findings: Secondary | ICD-10-CM | POA: Diagnosis not present

## 2019-08-20 LAB — CBC WITH DIFFERENTIAL/PLATELET
Basophils Absolute: 0 10*3/uL (ref 0.0–0.1)
Basophils Relative: 0.4 % (ref 0.0–3.0)
Eosinophils Absolute: 0.1 10*3/uL (ref 0.0–0.7)
Eosinophils Relative: 1.2 % (ref 0.0–5.0)
HCT: 41.6 % (ref 39.0–52.0)
Hemoglobin: 14 g/dL (ref 13.0–17.0)
Lymphocytes Relative: 40.4 % (ref 12.0–46.0)
Lymphs Abs: 1.9 10*3/uL (ref 0.7–4.0)
MCHC: 33.6 g/dL (ref 30.0–36.0)
MCV: 88.9 fl (ref 78.0–100.0)
Monocytes Absolute: 0.4 10*3/uL (ref 0.1–1.0)
Monocytes Relative: 7.9 % (ref 3.0–12.0)
Neutro Abs: 2.3 10*3/uL (ref 1.4–7.7)
Neutrophils Relative %: 50.1 % (ref 43.0–77.0)
Platelets: 239 10*3/uL (ref 150.0–400.0)
RBC: 4.68 Mil/uL (ref 4.22–5.81)
RDW: 12.4 % (ref 11.5–15.5)
WBC: 4.7 10*3/uL (ref 4.0–10.5)

## 2019-08-20 LAB — BASIC METABOLIC PANEL
BUN: 17 mg/dL (ref 6–23)
CO2: 28 mEq/L (ref 19–32)
Calcium: 9 mg/dL (ref 8.4–10.5)
Chloride: 104 mEq/L (ref 96–112)
Creatinine, Ser: 0.72 mg/dL (ref 0.40–1.50)
GFR: 113.22 mL/min (ref >60.00)
Glucose, Bld: 132 mg/dL — ABNORMAL HIGH (ref 70–99)
Potassium: 3.7 mEq/L (ref 3.5–5.1)
Sodium: 138 mEq/L (ref 135–145)

## 2019-08-20 LAB — HEPATIC FUNCTION PANEL
ALT: 23 U/L (ref 0–53)
AST: 16 U/L (ref 0–37)
Albumin: 4 g/dL (ref 3.5–5.2)
Alkaline Phosphatase: 67 U/L (ref 39–117)
Bilirubin, Direct: 0.1 mg/dL (ref 0.0–0.3)
Total Bilirubin: 0.4 mg/dL (ref 0.2–1.2)
Total Protein: 7 g/dL (ref 6.0–8.3)

## 2019-08-20 LAB — LIPID PANEL
Cholesterol: 259 mg/dL — ABNORMAL HIGH (ref 0–200)
HDL: 49.1 mg/dL (ref >39.00)
Total CHOL/HDL Ratio: 5
Triglycerides: 405 mg/dL — ABNORMAL HIGH (ref 0.0–149.0)

## 2019-08-20 LAB — IBC PANEL
Iron: 91 ug/dL (ref 42–165)
Saturation Ratios: 25.7 % (ref 20.0–50.0)
Transferrin: 253 mg/dL (ref 212.0–360.0)

## 2019-08-20 LAB — HEMOGLOBIN A1C: Hgb A1c MFr Bld: 6.5 % (ref 4.6–6.5)

## 2019-08-20 LAB — TSH: TSH: 0.63 u[IU]/mL (ref 0.35–4.50)

## 2019-08-20 LAB — VITAMIN D 25 HYDROXY (VIT D DEFICIENCY, FRACTURES): VITD: 21.59 ng/mL — ABNORMAL LOW (ref 30.00–100.00)

## 2019-08-20 LAB — VITAMIN B12: Vitamin B-12: 598 pg/mL (ref 211–911)

## 2019-08-20 LAB — PSA: PSA: 0.4 ng/mL (ref 0.10–4.00)

## 2019-08-20 LAB — LDL CHOLESTEROL, DIRECT: Direct LDL: 135 mg/dL

## 2019-08-20 MED ORDER — ZOLPIDEM TARTRATE 10 MG PO TABS: 10.0000 mg | ORAL_TABLET | Freq: Every evening | ORAL | 1 refills | Status: DC | PRN

## 2019-08-20 NOTE — Assessment & Plan Note (Signed)
stable overall by history and exam, recent data reviewed with pt, and pt to continue medical treatment as before,  to f/u any worsening symptoms or concerns  

## 2019-08-20 NOTE — Assessment & Plan Note (Addendum)
Suspicious for myasthenia gravis, for neurology referral  I spent 30 minutes in addition to time for CPX wellness examination in preparing to see the patient by review of recent labs, imaging and procedures, obtaining and reviewing separately obtained history, communicating with the patient and family or caregiver, ordering medications, tests or procedures, and documenting clinical information in the EHR including the differential Dx, treatment, and any further evaluation and other management of droopy eyelids, HLD, hyperglycemia, hx of lung cancer, insomnia

## 2019-08-20 NOTE — Assessment & Plan Note (Signed)

## 2019-08-20 NOTE — Assessment & Plan Note (Signed)
For f/u cxr

## 2019-08-20 NOTE — Assessment & Plan Note (Signed)
Stable, for med refill

## 2019-08-20 NOTE — Progress Notes (Signed)
Subjective:    Patient ID: Nicolas Cantu, male    DOB: 23-Jan-1966, 54 y.o.   MRN: [id number]  HPI  Here for wellness and f/u;  Overall doing ok;  Pt denies Chest pain, worsening SOB, DOE, wheezing, orthopnea, PND, worsening LE edema, palpitations, dizziness or syncope.  Pt denies neurological change such as new headache, facial or extremity weakness.  Pt denies polydipsia, polyuria, or low sugar symptoms. Pt states overall good compliance with treatment and medications, good tolerability, and has been trying to follow appropriate diet.  Pt denies worsening depressive symptoms, suicidal ideation or panic. No fever, night sweats, wt loss, loss of appetite, or other constitutional symptoms.  Pt states good ability with ADL's, has low fall risk, home safety reviewed and adequate, no other significant changes in hearing or vision, and only occasionally active with exercise. Also had 1 yr onset droopy eyelids, has to purposefully frequently work at keeping them open, now becoming a real problem with driving. Past Medical History:  Diagnosis Date  . Abdominal pain   . Allergic rhinitis, cause unspecified 02/26/2011  . Allergy   . Asthma 02/26/2011  . Colon polyp   . Diabetes mellitus    ?  Marland Kitchen DIABETES MELLITUS, UNCONTROLLED 02/17/2009  . Diarrhea 02/17/2009  . HEMATOCHEZIA 02/17/2009  . Hepatitis    hx  . History of syphilis 08/24/2012   Remote tx  . HYPERLIPIDEMIA 02/17/2009  . LEG PAIN, BILATERAL 02/17/2009  . Lung cancer ([place]) 2013   LUL  . Lung mass   . OSTEOARTHRITIS, KNEES, BILATERAL 05/14/2010  . Polyuria 03/24/2010  . Preventative health care 11/15/2010  . Shortness of breath   . Tuberculosis    hx of tb 93 tx   Past Surgical History:  Procedure Laterality Date  . BREATH TEK H PYLORI N/A 04/19/2013   Procedure: BREATH TEK H PYLORI;  Surgeon: Jerene Bears, MD;  Location: [facility];  Service: Gastroenterology;  Laterality: N/A;  . COLONOSCOPY    . ESOPHAGOGASTRODUODENOSCOPY    . LUNG  REMOVAL, PARTIAL  10-18-2011   LUL  . POLYPECTOMY    . UPPER GASTROINTESTINAL ENDOSCOPY      reports that he has never smoked. He has never used smokeless tobacco. He reports current alcohol use. He reports that he does not use drugs. family history is not on file. No Known Allergies Current Outpatient Medications on File Prior to Visit  Medication Sig Dispense Refill  . aspirin 81 MG EC tablet Take 81 mg by mouth daily.      . cetirizine (ZYRTEC) 10 MG tablet Take 1 tablet (10 mg total) daily by mouth. 90 tablet 3  . albuterol (PROVENTIL HFA;VENTOLIN HFA) 108 (90 Base) MCG/ACT inhaler Inhale 2 puffs into the lungs every 6 (six) hours as needed for wheezing or shortness of breath. (Patient not taking: Reported on 08/20/2019) 1 Inhaler 5  . atorvastatin (LIPITOR) 80 MG tablet Take 1 tablet (80 mg total) daily by mouth. (Patient not taking: Reported on 08/20/2019) 90 tablet 3  . naproxen (NAPROSYN) 500 MG tablet Take 1 tablet (500 mg total) by mouth 2 (two) times daily as needed for mild pain or moderate pain. (Patient not taking: Reported on 08/20/2019) 60 tablet 5  . triamcinolone (KENALOG) 0.1 % paste Use as directed 1 application 2 (two) times daily in the mouth or throat. (Patient not taking: Reported on 08/20/2019) 5 g 12  . triamcinolone (NASACORT AQ) 55 MCG/ACT AERO nasal inhaler Place 2 sprays daily into the nose. (  Patient not taking: Reported on 08/20/2019) 1 Inhaler 12   No current facility-administered medications on file prior to visit.   Review of Systems All otherwise neg per pt     Objective:   Physical Exam BP 118/72   Pulse 88   Temp 98.4 F (36.9 C)   Ht 5\' 3"  (1.6 m)   Wt 160 lb 6.4 oz (72.8 kg)   SpO2 100%   BMI 28.41 kg/m  VS noted,  Constitutional: Pt appears in NAD HENT: Head: NCAT.  Right Ear: External ear normal.  Left Ear: External ear normal.  Eyes: . Pupils are equal, round, and reactive to light. Conjunctivae and EOM are normal Nose: without d/c or  deformity Neck: Neck supple. Gross normal ROM Cardiovascular: Normal rate and regular rhythm.   Pulmonary/Chest: Effort normal and breath sounds without rales or wheezing.  Abd:  Soft, NT, ND, + BS, no organomegaly Neurological: Pt is alert. At baseline orientation, motor grossly intact Skin: Skin is warm. No rashes, other new lesions, no LE edema Psychiatric: Pt behavior is normal without agitation  All otherwise neg per pt  Lab Results  Component Value Date   WBC 5.2 06/23/2018   HGB 14.8 06/23/2018   HCT 43.9 06/23/2018   PLT 256.0 06/23/2018   GLUCOSE 98 06/23/2018   CHOL 197 06/23/2018   TRIG 131.0 06/23/2018   HDL 66.50 06/23/2018   LDLDIRECT 146.0 04/13/2017   LDLCALC 105 (H) 06/23/2018   ALT 29 06/23/2018   AST 23 06/23/2018   NA 137 06/23/2018   K 3.9 06/23/2018   CL 100 06/23/2018   CREATININE 0.85 06/23/2018   BUN 17 06/23/2018   CO2 27 06/23/2018   TSH 0.55 06/23/2018   PSA 0.41 06/23/2018   INR 0.93 10/15/2011   HGBA1C 6.2 02/11/2016   MICROALBUR 0.5 03/16/2012      Assessment & Plan:

## 2019-08-20 NOTE — Patient Instructions (Signed)
.  Please continue all other medications as before, and refills have been done if requested.  Please have the pharmacy call with any other refills you may need.  Please continue your efforts at being more active, low cholesterol diet, and weight control.  You are otherwise up to date with prevention measures today.  Please keep your appointments with your specialists as you may have planned  You will be contacted regarding the referral for: Neurology to rule out myasthenia gravis  Please go to the XRAY Department in the first floor for the x-ray testing  Please go to the LAB at the blood drawing area for the tests to be done  You will be contacted by phone if any changes need to be made immediately.  Otherwise, you will receive a letter about your results with an explanation, but please check with MyChart first.  Please remember to sign up for MyChart if you have not done so, as this will be important to you in the future with finding out test results, communicating by private email, and scheduling acute appointments online when needed.  Please make an Appointment to return for your 1 year visit, or sooner if needed, with Lab testing by Appointment as well, to be done about 3-5 days before at the [place] (so this is for TWO appointments - please see the scheduling desk as you leave)

## 2019-08-21 ENCOUNTER — Other Ambulatory Visit: Payer: Self-pay | Admitting: Internal Medicine

## 2019-08-21 LAB — URINALYSIS, ROUTINE W REFLEX MICROSCOPIC
Bilirubin Urine: NEGATIVE
Hgb urine dipstick: NEGATIVE
Ketones, ur: NEGATIVE
Leukocytes,Ua: NEGATIVE
Nitrite: NEGATIVE
RBC / HPF: NONE SEEN (ref 0–?)
Specific Gravity, Urine: 1.02 (ref 1.000–1.030)
Total Protein, Urine: NEGATIVE
Urine Glucose: >=1000 — AB
Urobilinogen, UA: 0.2 (ref 0.0–1.0)
WBC, UA: NONE SEEN (ref 0–?)
pH: 6.5 (ref 5.0–8.0)

## 2019-08-21 MED ORDER — VITAMIN D (ERGOCALCIFEROL) 1.25 MG (50000 UNIT) PO CAPS: 50000.0000 [IU] | ORAL_CAPSULE | ORAL | 0 refills | Status: DC

## 2019-09-05 ENCOUNTER — Encounter: Payer: Self-pay | Admitting: Internal Medicine

## 2019-10-18 ENCOUNTER — Encounter: Payer: Self-pay | Admitting: Neurology

## 2020-01-31 NOTE — Progress Notes (Signed)
[facility] Clinic Note - Initial Visit   Date: 02/01/20  Nicolas Cantu MRN: [id number] DOB: 01-16-1966   Dear Dr. Jenny Reichmann:  Thank you for your kind referral of Nicolas Cantu for consultation of droopy eyelid. Although his history is well known to you, please allow [place] to reiterate it for the purpose of our medical record. The patient was accompanied to the clinic by self.   History of Present Illness: Nicolas Cantu is a 54 y.o. male with history of lung cancer, diabetes mellitus, and hyperlipidemia presenting for evaluation of droopy eyelids.  He reports having spells of droopiness of the eyelids for the past year.  It can occur any time of day and reports having complete eyelid closure.  He has to forcefully open his eyelids to get them to stay open.  Both eyes are affected at the same time.  There is no diurnal variation. No associated double vision, difficulty swallowing, talking, or weakness. Prior eye exam did not find any pathology to explain symptoms. He denies feeling sleepy during these spells.    Out-side paper records, electronic medical record, and images have been reviewed where available and summarized as:  Lab Results  Component Value Date   HGBA1C 6.5 08/20/2019   Lab Results  Component Value Date   ONGEXBMW41 324 08/20/2019   Lab Results  Component Value Date   TSH 0.63 08/20/2019   Lab Results  Component Value Date   ESRSEDRATE 15 07/02/2013    Past Medical History:  Diagnosis Date  . Abdominal pain   . Allergic rhinitis, cause unspecified 02/26/2011  . Allergy   . Asthma 02/26/2011  . Colon polyp   . Diabetes mellitus    ?  Marland Kitchen DIABETES MELLITUS, UNCONTROLLED 02/17/2009  . Diarrhea 02/17/2009  . HEMATOCHEZIA 02/17/2009  . Hepatitis    hx  . History of syphilis 08/24/2012   Remote tx  . HYPERLIPIDEMIA 02/17/2009  . LEG PAIN, BILATERAL 02/17/2009  . Lung cancer ([place]) 2013   LUL  . Lung mass   . OSTEOARTHRITIS, KNEES, BILATERAL 05/14/2010    . Polyuria 03/24/2010  . Preventative health care 11/15/2010  . Shortness of breath   . Tuberculosis    hx of tb 93 tx    Past Surgical History:  Procedure Laterality Date  . BREATH TEK H PYLORI N/A 04/19/2013   Procedure: BREATH TEK H PYLORI;  Surgeon: Jerene Bears, MD;  Location: [facility];  Service: Gastroenterology;  Laterality: N/A;  . COLONOSCOPY    . ESOPHAGOGASTRODUODENOSCOPY    . LUNG REMOVAL, PARTIAL  10-18-2011   LUL  . POLYPECTOMY    . UPPER GASTROINTESTINAL ENDOSCOPY       Medications:  Outpatient Encounter Medications as of 02/01/2020  Medication Sig  . aspirin 81 MG EC tablet Take 81 mg by mouth daily.    Marland Kitchen atorvastatin (LIPITOR) 80 MG tablet Take 1 tablet (80 mg total) daily by mouth.  . cetirizine (ZYRTEC) 10 MG tablet Take 1 tablet (10 mg total) daily by mouth.  . Vitamin D, Ergocalciferol, (DRISDOL) 1.25 MG (50000 UNIT) CAPS capsule Take 1 capsule (50,000 Units total) by mouth every 7 (seven) days.  Marland Kitchen zolpidem (AMBIEN) 10 MG tablet Take 1 tablet (10 mg total) by mouth at bedtime as needed for sleep.  . [DISCONTINUED] albuterol (PROVENTIL HFA;VENTOLIN HFA) 108 (90 Base) MCG/ACT inhaler Inhale 2 puffs into the lungs every 6 (six) hours as needed for wheezing or shortness of breath. (Patient not taking: Reported on 08/20/2019)  . [  DISCONTINUED] naproxen (NAPROSYN) 500 MG tablet Take 1 tablet (500 mg total) by mouth 2 (two) times daily as needed for mild pain or moderate pain. (Patient not taking: Reported on 08/20/2019)  . [DISCONTINUED] triamcinolone (KENALOG) 0.1 % paste Use as directed 1 application 2 (two) times daily in the mouth or throat. (Patient not taking: Reported on 08/20/2019)  . [DISCONTINUED] triamcinolone (NASACORT AQ) 55 MCG/ACT AERO nasal inhaler Place 2 sprays daily into the nose. (Patient not taking: Reported on 08/20/2019)   No facility-administered encounter medications on file as of 02/01/2020.    Allergies: No Known Allergies  Family  History: Family History  Problem Relation Age of Onset  . Colon cancer Neg Hx   . Esophageal cancer Neg Hx   . Rectal cancer Neg Hx   . Stomach cancer Neg Hx   . Colon polyps Neg Hx     Social History: Social History   Tobacco Use  . Smoking status: Never Smoker  . Smokeless tobacco: Never Used  Vaping Use  . Vaping Use: Never used  Substance Use Topics  . Alcohol use: Yes    Alcohol/week: 0.0 standard drinks    Comment: rare  . Drug use: No   Social History   Social History Narrative   Family history unknown as parents killed by [employer] in [place] when he was 54 yo.    Vital Signs:  BP 120/70   Pulse 82   Ht 5\' 3"  (1.6 m)   Wt 154 lb (69.9 kg)   SpO2 97%   BMI 27.28 kg/m    Neurological Exam: MENTAL STATUS including orientation to time, place, person, recent and remote memory, attention span and concentration, language, and fund of knowledge is normal.  Speech is not dysarthric.  CRANIAL NERVES: II:  No visual field defects.     III-IV-VI: Pupils equal round and reactive to light.  Normal conjugate, extra-ocular eye movements in all directions of gaze.  No nystagmus.  Mild right ptosis with redundant upper lid skin, no worsening with sustained upgaze.  No ptosis on the left. Marland Kitchen   VII:  Normal facial symmetry and movements.   VIII:  Normal hearing and vestibular function.   IX-X:  Normal palatal movement.   XI:  Normal shoulder shrug and head rotation.   XII:  Normal tongue strength and range of motion, no deviation or fasciculation.  MOTOR: Motor strength is 5/5 throughout.  No atrophy, fasciculations or abnormal movements.  No pronator drift.   MSRs:  Right        Left                  brachioradialis 2+  2+  biceps 2+  2+  triceps 2+  2+  patellar 2+  2+  ankle jerk 2+  2+  Hoffman no  no  plantar response down  down   SENSORY:  Normal and symmetric perception of light touch, pinprick, vibration, and proprioception.   COORDINATION/GAIT: Normal  finger-to- nose-finger.  Intact rapid alternating movements bilaterally.  Gait narrow based and stable. Tandem and stressed gait intact.    IMPRESSION: Spells eyelid closure. Exam shows trace right ptosis, which seems physiologic.  No ocular or bulbar weakness is appreciated.  His symptoms are not classic for myasthenia, but to be complete, I will check AChR antibodies, but my overall suspicion is low for myasthenia gravis.    Thank you for allowing me to participate in patient's care.  If I can answer any additional  questions, I would be pleased to do so.    Sincerely,    Kenichi Cassada K. Posey Pronto, DO

## 2020-02-01 ENCOUNTER — Ambulatory Visit (INDEPENDENT_AMBULATORY_CARE_PROVIDER_SITE_OTHER): Payer: No Typology Code available for payment source | Admitting: Neurology

## 2020-02-01 ENCOUNTER — Encounter: Payer: Self-pay | Admitting: Neurology

## 2020-02-01 ENCOUNTER — Other Ambulatory Visit: Payer: Self-pay

## 2020-02-01 VITALS — BP 120/70 | HR 82 | Ht 63.0 in | Wt 154.0 lb

## 2020-02-01 DIAGNOSIS — H02403 Unspecified ptosis of bilateral eyelids: Secondary | ICD-10-CM | POA: Diagnosis not present

## 2020-02-01 NOTE — Patient Instructions (Signed)
We will check labs and post the results to MyChart when they are available.

## 2020-02-06 ENCOUNTER — Other Ambulatory Visit: Payer: Self-pay

## 2020-02-06 ENCOUNTER — Other Ambulatory Visit (INDEPENDENT_AMBULATORY_CARE_PROVIDER_SITE_OTHER): Payer: No Typology Code available for payment source

## 2020-02-06 DIAGNOSIS — H02403 Unspecified ptosis of bilateral eyelids: Secondary | ICD-10-CM

## 2020-02-13 LAB — MYASTHENIA GRAVIS PANEL 2
A CHR BINDING ABS: <0.3 nmol/L
ACHR Blocking Abs: <15 % Inhibition (ref <15)
Acetylchol Modul Ab: 13 % Inhibition

## 2020-02-14 ENCOUNTER — Telehealth: Payer: Self-pay

## 2020-02-14 NOTE — Telephone Encounter (Signed)
Called patient and left a message for a call back.  

## 2020-02-14 NOTE — Telephone Encounter (Signed)
-----   Message from Alda Berthold, DO sent at 02/14/2020  2:29 PM EDT ----- Please notify patient lab are within normal limits, no evidence of neurological condition causing his eyes to close.  Thank you.

## 2020-02-15 NOTE — Telephone Encounter (Signed)
Patient called in returning [place] phone call.

## 2020-02-15 NOTE — Telephone Encounter (Signed)
-----   Message from Alda Berthold, DO sent at 02/14/2020  2:29 PM EDT ----- Please notify patient lab are within normal limits, no evidence of neurological condition causing his eyes to close.  Thank you.

## 2020-02-15 NOTE — Telephone Encounter (Signed)
Called patient and left message for a call back.  

## 2020-02-18 NOTE — Telephone Encounter (Signed)
Called patient and informed him of results. Patient expressed understanding.

## 2020-08-13 ENCOUNTER — Other Ambulatory Visit: Payer: No Typology Code available for payment source

## 2020-08-19 ENCOUNTER — Other Ambulatory Visit (INDEPENDENT_AMBULATORY_CARE_PROVIDER_SITE_OTHER): Payer: No Typology Code available for payment source

## 2020-08-19 DIAGNOSIS — Z0001 Encounter for general adult medical examination with abnormal findings: Secondary | ICD-10-CM

## 2020-08-19 DIAGNOSIS — Z125 Encounter for screening for malignant neoplasm of prostate: Secondary | ICD-10-CM | POA: Diagnosis not present

## 2020-08-19 DIAGNOSIS — R7302 Impaired glucose tolerance (oral): Secondary | ICD-10-CM | POA: Diagnosis not present

## 2020-08-19 LAB — LIPID PANEL
Cholesterol: 242 mg/dL — ABNORMAL HIGH (ref 0–200)
HDL: 66.2 mg/dL (ref >39.00)
NonHDL: 175.99
Total CHOL/HDL Ratio: 4
Triglycerides: 236 mg/dL — ABNORMAL HIGH (ref 0.0–149.0)
VLDL: 47.2 mg/dL — ABNORMAL HIGH (ref 0.0–40.0)

## 2020-08-19 LAB — CBC WITH DIFFERENTIAL/PLATELET
Basophils Absolute: 0 10*3/uL (ref 0.0–0.1)
Basophils Relative: 0.5 % (ref 0.0–3.0)
Eosinophils Absolute: 0.1 10*3/uL (ref 0.0–0.7)
Eosinophils Relative: 2.3 % (ref 0.0–5.0)
HCT: 41.6 % (ref 39.0–52.0)
Hemoglobin: 14.2 g/dL (ref 13.0–17.0)
Lymphocytes Relative: 35.4 % (ref 12.0–46.0)
Lymphs Abs: 2 10*3/uL (ref 0.7–4.0)
MCHC: 34.2 g/dL (ref 30.0–36.0)
MCV: 87.7 fl (ref 78.0–100.0)
Monocytes Absolute: 0.3 10*3/uL (ref 0.1–1.0)
Monocytes Relative: 6.1 % (ref 3.0–12.0)
Neutro Abs: 3.1 10*3/uL (ref 1.4–7.7)
Neutrophils Relative %: 55.7 % (ref 43.0–77.0)
Platelets: 250 10*3/uL (ref 150.0–400.0)
RBC: 4.74 Mil/uL (ref 4.22–5.81)
RDW: 12.6 % (ref 11.5–15.5)
WBC: 5.6 10*3/uL (ref 4.0–10.5)

## 2020-08-19 LAB — BASIC METABOLIC PANEL
BUN: 19 mg/dL (ref 6–23)
CO2: 26 mEq/L (ref 19–32)
Calcium: 9.4 mg/dL (ref 8.4–10.5)
Chloride: 102 mEq/L (ref 96–112)
Creatinine, Ser: 0.88 mg/dL (ref 0.40–1.50)
GFR: 96.28 mL/min (ref >60.00)
Glucose, Bld: 103 mg/dL — ABNORMAL HIGH (ref 70–99)
Potassium: 3.7 mEq/L (ref 3.5–5.1)
Sodium: 138 mEq/L (ref 135–145)

## 2020-08-19 LAB — URINALYSIS, ROUTINE W REFLEX MICROSCOPIC
Bilirubin Urine: NEGATIVE
Hgb urine dipstick: NEGATIVE
Ketones, ur: NEGATIVE
Leukocytes,Ua: NEGATIVE
Nitrite: NEGATIVE
RBC / HPF: NONE SEEN (ref 0–?)
Specific Gravity, Urine: 1.025 (ref 1.000–1.030)
Total Protein, Urine: NEGATIVE
Urine Glucose: 100 — AB
Urobilinogen, UA: 0.2 (ref 0.0–1.0)
WBC, UA: NONE SEEN (ref 0–?)
pH: 5.5 (ref 5.0–8.0)

## 2020-08-19 LAB — PSA: PSA: 0.6 ng/mL (ref 0.10–4.00)

## 2020-08-19 LAB — HEPATIC FUNCTION PANEL
ALT: 38 U/L (ref 0–53)
AST: 28 U/L (ref 0–37)
Albumin: 4.4 g/dL (ref 3.5–5.2)
Alkaline Phosphatase: 76 U/L (ref 39–117)
Bilirubin, Direct: 0.1 mg/dL (ref 0.0–0.3)
Total Bilirubin: 0.5 mg/dL (ref 0.2–1.2)
Total Protein: 7.6 g/dL (ref 6.0–8.3)

## 2020-08-19 LAB — HEMOGLOBIN A1C: Hgb A1c MFr Bld: 6.2 % (ref 4.6–6.5)

## 2020-08-19 LAB — TSH: TSH: 0.8 u[IU]/mL (ref 0.35–4.50)

## 2020-08-20 ENCOUNTER — Ambulatory Visit (INDEPENDENT_AMBULATORY_CARE_PROVIDER_SITE_OTHER): Payer: No Typology Code available for payment source | Admitting: Internal Medicine

## 2020-08-20 ENCOUNTER — Encounter: Payer: Self-pay | Admitting: Internal Medicine

## 2020-08-20 ENCOUNTER — Other Ambulatory Visit: Payer: Self-pay

## 2020-08-20 VITALS — BP 120/80 | HR 100 | Temp 98.3°F | Ht 63.0 in | Wt 151.0 lb

## 2020-08-20 DIAGNOSIS — R7989 Other specified abnormal findings of blood chemistry: Secondary | ICD-10-CM | POA: Diagnosis not present

## 2020-08-20 DIAGNOSIS — Z0001 Encounter for general adult medical examination with abnormal findings: Secondary | ICD-10-CM

## 2020-08-20 DIAGNOSIS — R7302 Impaired glucose tolerance (oral): Secondary | ICD-10-CM

## 2020-08-20 DIAGNOSIS — E785 Hyperlipidemia, unspecified: Secondary | ICD-10-CM

## 2020-08-20 LAB — LDL CHOLESTEROL, DIRECT: Direct LDL: 123 mg/dL

## 2020-08-20 MED ORDER — ROSUVASTATIN CALCIUM 20 MG PO TABS: 20.0000 mg | ORAL_TABLET | Freq: Every day | ORAL | 3 refills | Status: DC

## 2020-08-20 NOTE — Progress Notes (Signed)
Patient ID: Nicolas Cantu, male   DOB: Feb 03, 1966, 55 y.o.   MRN: [id number]         Chief Complaint:: wellness exam and HLD, low vit d, hyperglycemia       HPI:  Nicolas Cantu is a 55 y.o. male here for wellness exam; declines flu, covid vaccines, o/w up to date with preventive referrals, and immunizations                        Also has incresaed difficulty with eyelid drooping, suggested surgury and eye drops per optho in feb 2022.  Not felt to c/w myasthenia per neurology, and achr Ab negative.  Stopped his lipitor due to worsening CP every time he took it.  Willing to try a different statin.  Pt denies chest pain, increased sob or doe, wheezing, orthopnea, PND, increased LE swelling, palpitations, dizziness or syncope.  No worsening neuro focal s/s.   Pt denies polydipsia, polyuria,  Pt denies fever, wt loss, night sweats, loss of appetite, or other constitutional symptoms  Not taking Vit D  Has no other new complaints Wt Readings from Last 3 Encounters:  08/20/20 151 lb (68.5 kg)  02/01/20 154 lb (69.9 kg)  08/20/19 160 lb 6.4 oz (72.8 kg)   BP Readings from Last 3 Encounters:  08/20/20 120/80  02/01/20 120/70  08/20/19 118/72   Immunization History  Administered Date(s) Administered  . Influenza Split 03/17/2012  . Influenza,inj,Quad PF,6+ Mos 03/20/2013, 02/07/2014, 02/11/2015, 04/14/2017, 06/23/2018  . Pneumococcal Polysaccharide-23 02/11/2015  . Td 06/07/2005  . Tdap 02/12/2016   There are no preventive care reminders to display for this patient.    Past Medical History:  Diagnosis Date  . Abdominal pain   . Allergic rhinitis, cause unspecified 02/26/2011  . Allergy   . Asthma 02/26/2011  . Colon polyp   . Diabetes mellitus    ?  Marland Kitchen DIABETES MELLITUS, UNCONTROLLED 02/17/2009  . Diarrhea 02/17/2009  . HEMATOCHEZIA 02/17/2009  . Hepatitis    hx  . History of syphilis 08/24/2012   Remote tx  . HYPERLIPIDEMIA 02/17/2009  . LEG PAIN, BILATERAL 02/17/2009  . Lung cancer ([place]) 2013    LUL  . Lung mass   . OSTEOARTHRITIS, KNEES, BILATERAL 05/14/2010  . Polyuria 03/24/2010  . Preventative health care 11/15/2010  . Shortness of breath   . Tuberculosis    hx of tb 93 tx   Past Surgical History:  Procedure Laterality Date  . BREATH TEK H PYLORI N/A 04/19/2013   Procedure: BREATH TEK H PYLORI;  Surgeon: Jerene Bears, MD;  Location: [facility];  Service: Gastroenterology;  Laterality: N/A;  . COLONOSCOPY    . ESOPHAGOGASTRODUODENOSCOPY    . LUNG REMOVAL, PARTIAL  10-18-2011   LUL  . POLYPECTOMY    . UPPER GASTROINTESTINAL ENDOSCOPY      reports that he has never smoked. He has never used smokeless tobacco. He reports current alcohol use. He reports that he does not use drugs. family history is not on file. No Known Allergies Current Outpatient Medications on File Prior to Visit  Medication Sig Dispense Refill  . aspirin 81 MG EC tablet Take 81 mg by mouth daily.    . cetirizine (ZYRTEC) 10 MG tablet Take 1 tablet (10 mg total) daily by mouth. 90 tablet 3  . zolpidem (AMBIEN) 10 MG tablet Take 1 tablet (10 mg total) by mouth at bedtime as needed for sleep. 90 tablet 1   No  current facility-administered medications on file prior to visit.        ROS:  All others reviewed and negative.  Objective        PE:  BP 120/80   Pulse 100   Temp 98.3 F (36.8 C) (Oral)   Ht 5\' 3"  (1.6 m)   Wt 151 lb (68.5 kg)   SpO2 97%   BMI 26.75 kg/m                 Constitutional: Pt appears in NAD               HENT: Head: NCAT.                Right Ear: External ear normal.                 Left Ear: External ear normal.                Eyes: . Pupils are equal, round, and reactive to light. Conjunctivae and EOM are normal               Nose: without d/c or deformity               Neck: Neck supple. Gross normal ROM               Cardiovascular: Normal rate and regular rhythm.                 Pulmonary/Chest: Effort normal and breath sounds without rales or wheezing.                 Abd:  Soft, NT, ND, + BS, no organomegaly               Neurological: Pt is alert. At baseline orientation, motor grossly intact               Skin: Skin is warm. No rashes, no other new lesions, LE edema - none               Psychiatric: Pt behavior is normal without agitation   Micro: none  Cardiac tracings I have personally interpreted today:  none  Pertinent Radiological findings (summarize): none   Lab Results  Component Value Date   WBC 5.6 08/19/2020   HGB 14.2 08/19/2020   HCT 41.6 08/19/2020   PLT 250.0 08/19/2020   GLUCOSE 103 (H) 08/19/2020   CHOL 242 (H) 08/19/2020   TRIG 236.0 (H) 08/19/2020   HDL 66.20 08/19/2020   LDLDIRECT 123.0 08/19/2020   LDLCALC 105 (H) 06/23/2018   ALT 38 08/19/2020   AST 28 08/19/2020   NA 138 08/19/2020   K 3.7 08/19/2020   CL 102 08/19/2020   CREATININE 0.88 08/19/2020   BUN 19 08/19/2020   CO2 26 08/19/2020   TSH 0.80 08/19/2020   PSA 0.60 08/19/2020   INR 0.93 10/15/2011   HGBA1C 6.2 08/19/2020   MICROALBUR 0.5 03/16/2012   Assessment/Plan:  Nicolas Cantu is a 55 y.o. Asian [4] male with  has a past medical history of Abdominal pain, Allergic rhinitis, cause unspecified (02/26/2011), Allergy, Asthma (02/26/2011), Colon polyp, Diabetes mellitus, DIABETES MELLITUS, UNCONTROLLED (02/17/2009), Diarrhea (02/17/2009), HEMATOCHEZIA (02/17/2009), Hepatitis, History of syphilis (08/24/2012), HYPERLIPIDEMIA (02/17/2009), LEG PAIN, BILATERAL (02/17/2009), Lung cancer ([place]) (2013), Lung mass, OSTEOARTHRITIS, KNEES, BILATERAL (05/14/2010), Polyuria (03/24/2010), Preventative health care (11/15/2010), Shortness of breath, and Tuberculosis.  Encounter for well adult exam with abnormal findings Age and sex appropriate education and counseling  updated with regular exercise and diet Referrals for preventative services - none needed Immunizations addressed - declines flu and covid vaccines Smoking counseling  - none needed Evidence for depression or other  mood disorder - none significant Most recent labs reviewed. I have personally reviewed and have noted: 1) the patient's medical and social history 2) The patient's current medications and supplements 3) The patient's height, weight, and BMI have been recorded in the chart   Hyperlipidemia Pt states intolerant to lipitor - ok for change to crestor, lower chol diet Lab Results  Component Value Date   CHOL 242 (H) 08/19/2020   HDL 66.20 08/19/2020   LDLCALC 105 (H) 06/23/2018   LDLDIRECT 123.0 08/19/2020   TRIG 236.0 (H) 08/19/2020   CHOLHDL 4 08/19/2020     Impaired glucose tolerance Lab Results  Component Value Date   HGBA1C 6.2 08/19/2020   Stable, pt to continue current medical treatment  - diet, wt control   Low vitamin D level Last vitamin D Lab Results  Component Value Date   VD25OH 21.59 (L) 08/20/2019   Low, to start oral replacement  Followup: Return in about 1 year (around 08/20/2021).  Cathlean Cower, MD 08/25/2020 1:32 AM [facility]

## 2020-08-20 NOTE — Patient Instructions (Signed)
Please take OTC Vitamin D3 at 2000 units per day, indefinitely.  Please take all new medication as prescribed - the crestor 20 mg per day  Please continue all other medications as before, and refills have been done if requested.  Please have the pharmacy call with any other refills you may need.  Please continue your efforts at being more active, low cholesterol diet, and weight control.  You are otherwise up to date with prevention measures today.  Please keep your appointments with your specialists as you may have planned  Please make an Appointment to return for your 1 year visit, or sooner if needed, with Lab testing by Appointment as well, to be done about 3-5 days before at the [facility] (so this is for TWO appointments - please see the scheduling desk as you leave)  Due to the ongoing Covid 19 pandemic, our lab now requires an appointment for any labs done at our office.  If you need labs done and do not have an appointment, please call our office ahead of time to schedule before presenting to the lab for your testing.

## 2020-08-25 ENCOUNTER — Encounter: Payer: Self-pay | Admitting: Internal Medicine

## 2020-08-25 ENCOUNTER — Other Ambulatory Visit: Payer: Self-pay | Admitting: Internal Medicine

## 2020-08-25 DIAGNOSIS — R7989 Other specified abnormal findings of blood chemistry: Secondary | ICD-10-CM

## 2020-08-25 DIAGNOSIS — Z0000 Encounter for general adult medical examination without abnormal findings: Secondary | ICD-10-CM

## 2020-08-25 DIAGNOSIS — R7302 Impaired glucose tolerance (oral): Secondary | ICD-10-CM

## 2020-08-25 NOTE — Assessment & Plan Note (Signed)
Lab Results  Component Value Date   HGBA1C 6.2 08/19/2020   Stable, pt to continue current medical treatment  - diet, wt control

## 2020-08-25 NOTE — Assessment & Plan Note (Signed)
Pt states intolerant to lipitor - ok for change to crestor, lower chol diet Lab Results  Component Value Date   CHOL 242 (H) 08/19/2020   HDL 66.20 08/19/2020   LDLCALC 105 (H) 06/23/2018   LDLDIRECT 123.0 08/19/2020   TRIG 236.0 (H) 08/19/2020   CHOLHDL 4 08/19/2020

## 2020-08-25 NOTE — Assessment & Plan Note (Signed)
Age and sex appropriate education and counseling updated with regular exercise and diet Referrals for preventative services - none needed Immunizations addressed - declines flu and covid vaccines Smoking counseling  - none needed Evidence for depression or other mood disorder - none significant Most recent labs reviewed. I have personally reviewed and have noted: 1) the patient's medical and social history 2) The patient's current medications and supplements 3) The patient's height, weight, and BMI have been recorded in the chart

## 2020-08-25 NOTE — Assessment & Plan Note (Signed)
Last vitamin D Lab Results  Component Value Date   VD25OH 21.59 (L) 08/20/2019   Low, to start oral replacement

## 2020-09-10 ENCOUNTER — Telehealth (INDEPENDENT_AMBULATORY_CARE_PROVIDER_SITE_OTHER): Payer: No Typology Code available for payment source | Admitting: Internal Medicine

## 2020-09-10 DIAGNOSIS — R059 Cough, unspecified: Secondary | ICD-10-CM

## 2020-09-10 DIAGNOSIS — R062 Wheezing: Secondary | ICD-10-CM | POA: Diagnosis not present

## 2020-09-10 DIAGNOSIS — H02403 Unspecified ptosis of bilateral eyelids: Secondary | ICD-10-CM

## 2020-09-10 MED ORDER — HYDROCODONE-HOMATROPINE 5-1.5 MG/5ML PO SYRP: 5.0000 mL | ORAL_SOLUTION | Freq: Four times a day (QID) | ORAL | 0 refills | Status: DC | PRN

## 2020-09-10 MED ORDER — PREDNISONE 10 MG PO TABS: ORAL_TABLET | ORAL | 0 refills | Status: DC

## 2020-09-10 MED ORDER — AZITHROMYCIN 250 MG PO TABS: ORAL_TABLET | ORAL | 0 refills | Status: DC

## 2020-09-10 NOTE — Progress Notes (Deleted)
Patient ID: Nicolas Cantu, male   DOB: 1965/12/11, 55 y.o.   MRN: [id number]        Chief Complaint: cough and wheezing        HPI:  Nicolas Cantu is a 55 y.o. male here with c/o 2 wks gradually worsening  ST, HA, general weakness and malaise, with prod cough greenish sputum, but Pt denies chest pain, increased sob or doe, wheezing, orthopnea, PND, increased LE swelling, palpitations, dizziness or syncope., except for onset midl wheezing, sob doe since lx 2 days.  Brother had cough before him.  Was covid neg 2 days ago at home.  Dayquil not working anymore.  Also has ongoing droopy eyelids bilat that affect vision and driving getting worse oveall, needs to see plastic surgury bc driving is dangerous, needs off work not from apr 11 to july11 to get this done.  Denies worsening focal neuro s/s.   Pt denies polydipsia, polyuria,        Wt Readings from Last 3 Encounters:  08/20/20 151 lb (68.5 kg)  02/01/20 154 lb (69.9 kg)  08/20/19 160 lb 6.4 oz (72.8 kg)   BP Readings from Last 3 Encounters:  08/20/20 120/80  02/01/20 120/70  08/20/19 118/72         Past Medical History:  Diagnosis Date  . Abdominal pain   . Allergic rhinitis, cause unspecified 02/26/2011  . Allergy   . Asthma 02/26/2011  . Colon polyp   . Diabetes mellitus    ?  Marland Kitchen DIABETES MELLITUS, UNCONTROLLED 02/17/2009  . Diarrhea 02/17/2009  . HEMATOCHEZIA 02/17/2009  . Hepatitis    hx  . History of syphilis 08/24/2012   Remote tx  . HYPERLIPIDEMIA 02/17/2009  . LEG PAIN, BILATERAL 02/17/2009  . Lung cancer ([place]) 2013   LUL  . Lung mass   . OSTEOARTHRITIS, KNEES, BILATERAL 05/14/2010  . Polyuria 03/24/2010  . Preventative health care 11/15/2010  . Shortness of breath   . Tuberculosis    hx of tb 93 tx   Past Surgical History:  Procedure Laterality Date  . BREATH TEK H PYLORI N/A 04/19/2013   Procedure: BREATH TEK H PYLORI;  Surgeon: Jerene Bears, MD;  Location: [facility];  Service: Gastroenterology;  Laterality: N/A;  .  COLONOSCOPY    . ESOPHAGOGASTRODUODENOSCOPY    . LUNG REMOVAL, PARTIAL  10-18-2011   LUL  . POLYPECTOMY    . UPPER GASTROINTESTINAL ENDOSCOPY      reports that he has never smoked. He has never used smokeless tobacco. He reports current alcohol use. He reports that he does not use drugs. family history is not on file. No Known Allergies Current Outpatient Medications on File Prior to Visit  Medication Sig Dispense Refill  . aspirin 81 MG EC tablet Take 81 mg by mouth daily.    . cetirizine (ZYRTEC) 10 MG tablet Take 1 tablet (10 mg total) daily by mouth. 90 tablet 3  . rosuvastatin (CRESTOR) 20 MG tablet Take 1 tablet (20 mg total) by mouth daily. 90 tablet 3  . zolpidem (AMBIEN) 10 MG tablet Take 1 tablet (10 mg total) by mouth at bedtime as needed for sleep. 90 tablet 1   No current facility-administered medications on file prior to visit.        ROS:  All others reviewed and negative.  Objective        PE:  There were no vitals taken for this visit.  Constitutional: Pt appears in NAD               HENT: Head: NCAT.                Right Ear: External ear normal.                 Left Ear: External ear normal.                Eyes: . Pupils are equal, round, and reactive to light. Conjunctivae and EOM are normal               Nose: without d/c or deformity               Neck: Neck supple. Gross normal ROM               Cardiovascular: Normal rate and regular rhythm.                 Pulmonary/Chest: Effort normal and breath sounds without rales or wheezing.                Abd:  Soft, NT, ND, + BS, no organomegaly               Neurological: Pt is alert. At baseline orientation, motor grossly intact               Skin: Skin is warm. No rashes, no other new lesions, LE edema - ***               Psychiatric: Pt behavior is normal without agitation   Micro: none  Cardiac tracings I have personally interpreted today:  none  Pertinent Radiological findings (summarize):  none   Lab Results  Component Value Date   WBC 5.6 08/19/2020   HGB 14.2 08/19/2020   HCT 41.6 08/19/2020   PLT 250.0 08/19/2020   GLUCOSE 103 (H) 08/19/2020   CHOL 242 (H) 08/19/2020   TRIG 236.0 (H) 08/19/2020   HDL 66.20 08/19/2020   LDLDIRECT 123.0 08/19/2020   LDLCALC 105 (H) 06/23/2018   ALT 38 08/19/2020   AST 28 08/19/2020   NA 138 08/19/2020   K 3.7 08/19/2020   CL 102 08/19/2020   CREATININE 0.88 08/19/2020   BUN 19 08/19/2020   CO2 26 08/19/2020   TSH 0.80 08/19/2020   PSA 0.60 08/19/2020   INR 0.93 10/15/2011   HGBA1C 6.2 08/19/2020   MICROALBUR 0.5 03/16/2012   Assessment/Plan:  Nicolas Cantu is a 55 y.o. Asian [4] male with  has a past medical history of Abdominal pain, Allergic rhinitis, cause unspecified (02/26/2011), Allergy, Asthma (02/26/2011), Colon polyp, Diabetes mellitus, DIABETES MELLITUS, UNCONTROLLED (02/17/2009), Diarrhea (02/17/2009), HEMATOCHEZIA (02/17/2009), Hepatitis, History of syphilis (08/24/2012), HYPERLIPIDEMIA (02/17/2009), LEG PAIN, BILATERAL (02/17/2009), Lung cancer ([place]) (2013), Lung mass, OSTEOARTHRITIS, KNEES, BILATERAL (05/14/2010), Polyuria (03/24/2010), Preventative health care (11/15/2010), Shortness of breath, and Tuberculosis.  No problem-specific Assessment & Plan notes found for this encounter.  Followup: No follow-ups on file.  Nicolas Cower, MD 09/10/2020 4:36 PM [facility]

## 2020-09-11 ENCOUNTER — Telehealth: Payer: Self-pay | Admitting: Internal Medicine

## 2020-09-11 MED ORDER — HYDROCOD POLST-CPM POLST ER 10-8 MG/5ML PO SUER: 5.0000 mL | Freq: Two times a day (BID) | ORAL | 0 refills | Status: DC | PRN

## 2020-09-11 NOTE — Telephone Encounter (Signed)
Ok for try change to tussionex - done erx

## 2020-09-11 NOTE — Telephone Encounter (Signed)
Patient calling to report HYDROcodone-homatropine (HYCODAN) 5-1.5 MG/5ML syrup is out of stock at pharmacy  Please send alternative

## 2020-09-11 NOTE — Telephone Encounter (Signed)
Patient notified

## 2020-09-12 ENCOUNTER — Telehealth: Payer: Self-pay | Admitting: Internal Medicine

## 2020-09-12 NOTE — Telephone Encounter (Signed)
Patient states he spoke to Dr. Jenny Reichmann at his appointment on 03.16.22 about a restriction letter for his work and he was wondering if Dr. Jenny Reichmann had done it yet.

## 2020-09-15 ENCOUNTER — Encounter: Payer: Self-pay | Admitting: Internal Medicine

## 2020-09-15 DIAGNOSIS — R062 Wheezing: Secondary | ICD-10-CM | POA: Insufficient documentation

## 2020-09-15 NOTE — Telephone Encounter (Signed)
Ok note is done hardcopy to be given to S summer

## 2020-09-15 NOTE — Assessment & Plan Note (Signed)
Mild for predpac asd,  to f/u any worsening symptoms or concerns

## 2020-09-15 NOTE — Assessment & Plan Note (Signed)
[place] for work note as pt requests ,  to f/u any worsening symptoms or concerns
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
## 2020-09-15 NOTE — Telephone Encounter (Signed)
Patient notified by voicemail that work note is ready up at the front desk

## 2020-09-15 NOTE — Patient Instructions (Signed)
Please take all new medication as prescribed  We will send the work note

## 2020-09-15 NOTE — Assessment & Plan Note (Signed)
Mild to mod, c/w bornchitis vs pna, for antibx course, cough medicine,  to f/u any worsening symptoms or concerns

## 2020-09-15 NOTE — Telephone Encounter (Signed)
Left pt a voicemail in regards to if he needs a referral or not for plastic surgery.

## 2020-09-18 NOTE — Telephone Encounter (Signed)
I received FMLA forms in regards to information below.  Forms have been completed and placed in providers box to review and sign.

## 2020-09-22 DIAGNOSIS — Z0279 Encounter for issue of other medical certificate: Secondary | ICD-10-CM

## 2020-09-22 NOTE — Telephone Encounter (Signed)
Forms have been signed, Copy sent to scan &Charged for.  LVM to inform patient. I do not have a fax number to send the forms too. Informed patient to call back with that inform or they are ready to be picked up.

## 2021-08-17 ENCOUNTER — Other Ambulatory Visit: Payer: No Typology Code available for payment source

## 2021-08-18 ENCOUNTER — Other Ambulatory Visit: Payer: Self-pay

## 2021-08-18 ENCOUNTER — Other Ambulatory Visit (INDEPENDENT_AMBULATORY_CARE_PROVIDER_SITE_OTHER): Payer: BC Managed Care – PPO

## 2021-08-18 DIAGNOSIS — Z0000 Encounter for general adult medical examination without abnormal findings: Secondary | ICD-10-CM | POA: Diagnosis not present

## 2021-08-18 DIAGNOSIS — R7989 Other specified abnormal findings of blood chemistry: Secondary | ICD-10-CM

## 2021-08-18 DIAGNOSIS — R7302 Impaired glucose tolerance (oral): Secondary | ICD-10-CM

## 2021-08-18 LAB — BASIC METABOLIC PANEL
BUN: 18 mg/dL (ref 6–23)
CO2: 31 mEq/L (ref 19–32)
Calcium: 9.5 mg/dL (ref 8.4–10.5)
Chloride: 100 mEq/L (ref 96–112)
Creatinine, Ser: 0.87 mg/dL (ref 0.40–1.50)
GFR: 95.94 mL/min (ref >60.00)
Glucose, Bld: 102 mg/dL — ABNORMAL HIGH (ref 70–99)
Potassium: 4 mEq/L (ref 3.5–5.1)
Sodium: 137 mEq/L (ref 135–145)

## 2021-08-18 LAB — CBC WITH DIFFERENTIAL/PLATELET
Basophils Absolute: 0 10*3/uL (ref 0.0–0.1)
Basophils Relative: 0.2 % (ref 0.0–3.0)
Eosinophils Absolute: 0.1 10*3/uL (ref 0.0–0.7)
Eosinophils Relative: 1.7 % (ref 0.0–5.0)
HCT: 43 % (ref 39.0–52.0)
Hemoglobin: 14.4 g/dL (ref 13.0–17.0)
Lymphocytes Relative: 52.4 % — ABNORMAL HIGH (ref 12.0–46.0)
Lymphs Abs: 2.8 10*3/uL (ref 0.7–4.0)
MCHC: 33.4 g/dL (ref 30.0–36.0)
MCV: 88.8 fl (ref 78.0–100.0)
Monocytes Absolute: 0.4 10*3/uL (ref 0.1–1.0)
Monocytes Relative: 8 % (ref 3.0–12.0)
Neutro Abs: 2.1 10*3/uL (ref 1.4–7.7)
Neutrophils Relative %: 37.7 % — ABNORMAL LOW (ref 43.0–77.0)
Platelets: 245 10*3/uL (ref 150.0–400.0)
RBC: 4.84 Mil/uL (ref 4.22–5.81)
RDW: 12.6 % (ref 11.5–15.5)
WBC: 5.4 10*3/uL (ref 4.0–10.5)

## 2021-08-18 LAB — LIPID PANEL
Cholesterol: 248 mg/dL — ABNORMAL HIGH (ref 0–200)
HDL: 51.6 mg/dL (ref >39.00)
NonHDL: 196.19
Total CHOL/HDL Ratio: 5
Triglycerides: 361 mg/dL — ABNORMAL HIGH (ref 0.0–149.0)
VLDL: 72.2 mg/dL — ABNORMAL HIGH (ref 0.0–40.0)

## 2021-08-18 LAB — URINALYSIS, ROUTINE W REFLEX MICROSCOPIC
Bilirubin Urine: NEGATIVE
Hgb urine dipstick: NEGATIVE
Ketones, ur: NEGATIVE
Leukocytes,Ua: NEGATIVE
Nitrite: NEGATIVE
RBC / HPF: NONE SEEN (ref 0–?)
Specific Gravity, Urine: 1.02 (ref 1.000–1.030)
Total Protein, Urine: NEGATIVE
Urine Glucose: NEGATIVE
Urobilinogen, UA: 0.2 (ref 0.0–1.0)
pH: 6 (ref 5.0–8.0)

## 2021-08-18 LAB — HEMOGLOBIN A1C: Hgb A1c MFr Bld: 6.8 % — ABNORMAL HIGH (ref 4.6–6.5)

## 2021-08-18 LAB — HEPATIC FUNCTION PANEL
ALT: 32 U/L (ref 0–53)
AST: 19 U/L (ref 0–37)
Albumin: 4.2 g/dL (ref 3.5–5.2)
Alkaline Phosphatase: 74 U/L (ref 39–117)
Bilirubin, Direct: 0.1 mg/dL (ref 0.0–0.3)
Total Bilirubin: 0.7 mg/dL (ref 0.2–1.2)
Total Protein: 6.9 g/dL (ref 6.0–8.3)

## 2021-08-18 LAB — LDL CHOLESTEROL, DIRECT: Direct LDL: 125 mg/dL

## 2021-08-18 LAB — VITAMIN D 25 HYDROXY (VIT D DEFICIENCY, FRACTURES): VITD: 23.57 ng/mL — ABNORMAL LOW (ref 30.00–100.00)

## 2021-08-18 LAB — PSA: PSA: 0.29 ng/mL (ref 0.10–4.00)

## 2021-08-18 LAB — TSH: TSH: 2.25 u[IU]/mL (ref 0.35–5.50)

## 2021-08-21 ENCOUNTER — Encounter: Payer: No Typology Code available for payment source | Admitting: Internal Medicine

## 2021-08-31 ENCOUNTER — Other Ambulatory Visit: Payer: Self-pay

## 2021-08-31 ENCOUNTER — Ambulatory Visit (INDEPENDENT_AMBULATORY_CARE_PROVIDER_SITE_OTHER): Payer: BC Managed Care – PPO | Admitting: Internal Medicine

## 2021-08-31 ENCOUNTER — Encounter: Payer: Self-pay | Admitting: Internal Medicine

## 2021-08-31 VITALS — BP 118/70 | HR 80 | Temp 98.9°F | Ht 63.0 in | Wt 161.8 lb

## 2021-08-31 DIAGNOSIS — Z0001 Encounter for general adult medical examination with abnormal findings: Secondary | ICD-10-CM

## 2021-08-31 DIAGNOSIS — R7302 Impaired glucose tolerance (oral): Secondary | ICD-10-CM | POA: Diagnosis not present

## 2021-08-31 DIAGNOSIS — E7800 Pure hypercholesterolemia, unspecified: Secondary | ICD-10-CM

## 2021-08-31 DIAGNOSIS — R7989 Other specified abnormal findings of blood chemistry: Secondary | ICD-10-CM

## 2021-08-31 NOTE — Progress Notes (Signed)
Patient ID: Nicolas Cantu, male   DOB: 12-Mar-1966, 56 y.o.   MRN: [id number] ? ? ? ?     Chief Complaint:: wellness exam and lo vit d, hld,  ? ?     HPI:  Nicolas Cantu is a 56 y.o. male here for wellness exam; declines flu shot, covid booster, shingrix, o/w up to date ?         ?              Also s/p right and left upper eyelid surgury, still has some lid lag left over and hoping more can be done, has optho appt next month.  Vision ok. Has seen neurology.  Pt denies chest pain, increased sob or doe, wheezing, orthopnea, PND, increased LE swelling, palpitations, dizziness or syncope.  Pt denies polydipsia, polyuria, or new focal neuro s/s.   Pt denies fever, wt loss, night sweats, loss of appetite, or other constitutional symptoms   In fact, Gained wt with extra calorses, plans more exercise.   ?Wt Readings from Last 3 Encounters:  ?08/31/21 161 lb 12.8 oz (73.4 kg)  ?08/20/20 151 lb (68.5 kg)  ?02/01/20 154 lb (69.9 kg)  ? ?BP Readings from Last 3 Encounters:  ?08/31/21 118/70  ?08/20/20 120/80  ?02/01/20 120/70  ? ?Immunization History  ?Administered Date(s) Administered  ? Influenza Split 03/17/2012  ? Influenza,inj,Quad PF,6+ Mos 03/20/2013, 02/07/2014, 02/11/2015, 04/14/2017, 06/23/2018  ? PFIZER(Purple Top)SARS-COV-2 Vaccination 10/02/2019, 10/27/2019  ? Pneumococcal Polysaccharide-23 02/11/2015  ? Td 06/07/2005  ? Tdap 02/12/2016  ? ?There are no preventive care reminders to display for this patient. ? ?  ? ?Past Medical History:  ?Diagnosis Date  ? Abdominal pain   ? Allergic rhinitis, cause unspecified 02/26/2011  ? Allergy   ? Asthma 02/26/2011  ? Colon polyp   ? Diabetes mellitus   ? ?  ? DIABETES MELLITUS, UNCONTROLLED 02/17/2009  ? Diarrhea 02/17/2009  ? HEMATOCHEZIA 02/17/2009  ? Hepatitis   ? hx  ? History of syphilis 08/24/2012  ? Remote tx  ? HYPERLIPIDEMIA 02/17/2009  ? LEG PAIN, BILATERAL 02/17/2009  ? Lung cancer [facility] 2013  ? LUL  ? Lung mass   ? OSTEOARTHRITIS, KNEES, BILATERAL 05/14/2010  ? Polyuria 03/24/2010   ? Preventative health care 11/15/2010  ? Shortness of breath   ? Tuberculosis   ? hx of tb 44 tx  ? ?Past Surgical History:  ?Procedure Laterality Date  ? BREATH TEK H PYLORI N/A 04/19/2013  ? Procedure: BREATH TEK H PYLORI;  Surgeon: Jerene Bears, MD;  Location: [facility];  Service: Gastroenterology;  Laterality: N/A;  ? COLONOSCOPY    ? ESOPHAGOGASTRODUODENOSCOPY    ? LUNG REMOVAL, PARTIAL  10-18-2011  ? LUL  ? POLYPECTOMY    ? UPPER GASTROINTESTINAL ENDOSCOPY    ? ? reports that he has never smoked. He has never used smokeless tobacco. He reports current alcohol use. He reports that he does not use drugs. ?family history is not on file. ?Allergies  ?Allergen Reactions  ? Atorvastatin Other (See Comments)  ?  Gi pain  ? ?Current Outpatient Medications on File Prior to Visit  ?Medication Sig Dispense Refill  ? cetirizine (ZYRTEC) 10 MG tablet Take 1 tablet (10 mg total) daily by mouth. 90 tablet 3  ? aspirin 81 MG EC tablet Take 81 mg by mouth daily. (Patient not taking: Reported on 08/31/2021)    ? rosuvastatin (CRESTOR) 20 MG tablet Take 1 tablet (20 mg total) by mouth daily. (Patient  not taking: Reported on 08/31/2021) 90 tablet 3  ? zolpidem (AMBIEN) 10 MG tablet Take 1 tablet (10 mg total) by mouth at bedtime as needed for sleep. (Patient not taking: Reported on 08/31/2021) 90 tablet 1  ? ?No current facility-administered medications on file prior to visit.  ? ?     ROS:  All others reviewed and negative. ? ?Objective  ? ?     PE:  BP 118/70 (BP Location: Right Arm, Patient Position: Sitting, Cuff Size: Large)   Pulse 80   Temp 98.9 ?F (37.2 ?C) (Oral)   Ht 5\' 3"  (1.6 m)   Wt 161 lb 12.8 oz (73.4 kg)   SpO2 96%   BMI 28.66 kg/m?  ? ?              Constitutional: Pt appears in NAD ?              HENT: Head: NCAT.  ?              Right Ear: External ear normal.   ?              Left Ear: External ear normal.  ?              Eyes: . Pupils are equal, round, and reactive to light. Conjunctivae and EOM are  normal ?              Nose: without d/c or deformity ?              Neck: Neck supple. Gross normal ROM ?              Cardiovascular: Normal rate and regular rhythm.   ?              Pulmonary/Chest: Effort normal and breath sounds without rales or wheezing.  ?              Abd:  Soft, NT, ND, + BS, no organomegaly ?              Neurological: Pt is alert. At baseline orientation, motor grossly intact ?              Skin: Skin is warm. No rashes, no other new lesions, LE edema - none ?              Psychiatric: Pt behavior is normal without agitation  ? ?Micro: none ? ?Cardiac tracings I have personally interpreted today:  none ? ?Pertinent Radiological findings (summarize): none  ? ?Lab Results  ?Component Value Date  ? WBC 5.4 08/18/2021  ? HGB 14.4 08/18/2021  ? HCT 43.0 08/18/2021  ? PLT 245.0 08/18/2021  ? GLUCOSE 102 (H) 08/18/2021  ? CHOL 248 (H) 08/18/2021  ? TRIG 361.0 (H) 08/18/2021  ? HDL 51.60 08/18/2021  ? LDLDIRECT 125.0 08/18/2021  ? LDLCALC 105 (H) 06/23/2018  ? ALT 32 08/18/2021  ? AST 19 08/18/2021  ? NA 137 08/18/2021  ? K 4.0 08/18/2021  ? CL 100 08/18/2021  ? CREATININE 0.87 08/18/2021  ? BUN 18 08/18/2021  ? CO2 31 08/18/2021  ? TSH 2.25 08/18/2021  ? PSA 0.29 08/18/2021  ? INR 0.93 10/15/2011  ? HGBA1C 6.8 (H) 08/18/2021  ? MICROALBUR 0.5 03/16/2012  ? ?Assessment/Plan:  ?Nicolas Cantu is a 56 y.o. Asian [4] male with  has a past medical history of Abdominal pain, Allergic rhinitis, cause unspecified (02/26/2011), Allergy, Asthma (02/26/2011), Colon polyp, Diabetes mellitus, DIABETES MELLITUS,  UNCONTROLLED (02/17/2009), Diarrhea (02/17/2009), HEMATOCHEZIA (02/17/2009), Hepatitis, History of syphilis (08/24/2012), HYPERLIPIDEMIA (02/17/2009), LEG PAIN, BILATERAL (02/17/2009), Lung cancer ([place]) (2013), Lung mass, OSTEOARTHRITIS, KNEES, BILATERAL (05/14/2010), Polyuria (03/24/2010), Preventative health care (11/15/2010), Shortness of breath, and Tuberculosis. ? ?Low vitamin D level ?Last vitamin D ?Lab  Results  ?Component Value Date  ? VD25OH 23.57 (L) 08/18/2021  ? ?Low, to start oral replacement ? ? ?Encounter for well adult exam with abnormal findings ?Age and sex appropriate education and counseling updated with regular exercise and diet ?Referrals for preventative services - none needed ?Immunizations addressed - declines flu shot, shingirx, covid booster ?Smoking counseling  - none needed ?Evidence for depression or other mood disorder - none significant ?Most recent labs reviewed. ?I have personally reviewed and have noted: ?1) the patient's medical and social history ?2) The patient's current medications and supplements ?3) The patient's height, weight, and BMI have been recorded in the chart ? ? ?Impaired glucose tolerance ?Lab Results  ?Component Value Date  ? HGBA1C 6.8 (H) 08/18/2021  ? ?Stable, pt to continue current medical treatment  - diet ? ? ?Hyperlipidemia ?Lab Results  ?Component Value Date  ? LDLCALC 105 (H) 06/23/2018  ? ?Uncontrolled, goal ldl < 100, pt to continue current statin crestor 20 as declines change for now, for lower chol diet, declines card ct score ? ?Followup: Return in about 6 months (around 03/03/2022). ? ?Cathlean Cower, MD 08/31/2021 10:25 PM ?[facility] ?

## 2021-08-31 NOTE — Patient Instructions (Addendum)
Please check your insurance coverage for the shingle shot, and consider go to local pharmacy to have this done.  ? ?Please take OTC Vitamin D3 at 2000 units per day, indefinitely ? ?Please continue all other medications as before, and refills have been done if requested. ? ?Please have the pharmacy call with any other refills you may need. ? ?Please continue your efforts at being more active, low cholesterol diet, and weight control. ? ?You are otherwise up to date with prevention measures today. ? ?Please keep your appointments with your specialists as you may have planned-  eye doctor next month ? ?Please call if you change your mind about the Cardiac CT score or starting Crestor for high cholesterol ? ?Please make an Appointment to return in 6 months, or sooner if needed, also with Lab Appointment for testing done 3-5 days before at the [facility] (so this is for TWO appointments - please see the scheduling desk as you leave) ? ?Due to the ongoing Covid 19 pandemic, our lab now requires an appointment for any labs done at our office.  If you need labs done and do not have an appointment, please call our office ahead of time to schedule before presenting to the lab for your testing. ? ? ? ? ?

## 2021-08-31 NOTE — Assessment & Plan Note (Signed)
Last vitamin D ?Lab Results  ?Component Value Date  ? VD25OH 23.57 (L) 08/18/2021  ? ?Low, to start oral replacement ? ?

## 2021-08-31 NOTE — Assessment & Plan Note (Signed)
Age and sex appropriate education and counseling updated with regular exercise and diet ?Referrals for preventative services - none needed ?Immunizations addressed - declines flu shot, shingirx, covid booster ?Smoking counseling  - none needed ?Evidence for depression or other mood disorder - none significant ?Most recent labs reviewed. ?I have personally reviewed and have noted: ?1) the patient's medical and social history ?2) The patient's current medications and supplements ?3) The patient's height, weight, and BMI have been recorded in the chart ? ?

## 2021-08-31 NOTE — Assessment & Plan Note (Signed)
Lab Results  ?Component Value Date  ? HGBA1C 6.8 (H) 08/18/2021  ? ?Stable, pt to continue current medical treatment  - diet ? ?

## 2021-08-31 NOTE — Assessment & Plan Note (Addendum)
Lab Results  ?Component Value Date  ? LDLCALC 105 (H) 06/23/2018  ? ?Uncontrolled, goal ldl < 100, pt to continue current statin crestor 20 as declines change for now, for lower chol diet, declines card ct score ? ?

## 2021-10-29 DIAGNOSIS — G245 Blepharospasm: Secondary | ICD-10-CM | POA: Diagnosis not present

## 2021-10-29 DIAGNOSIS — H02831 Dermatochalasis of right upper eyelid: Secondary | ICD-10-CM | POA: Diagnosis not present

## 2021-10-29 DIAGNOSIS — H02413 Mechanical ptosis of bilateral eyelids: Secondary | ICD-10-CM | POA: Diagnosis not present

## 2021-10-29 DIAGNOSIS — H0279 Other degenerative disorders of eyelid and periocular area: Secondary | ICD-10-CM | POA: Diagnosis not present

## 2021-10-29 DIAGNOSIS — H02834 Dermatochalasis of left upper eyelid: Secondary | ICD-10-CM | POA: Diagnosis not present

## 2022-01-05 DIAGNOSIS — G245 Blepharospasm: Secondary | ICD-10-CM | POA: Diagnosis not present

## 2022-02-04 ENCOUNTER — Encounter: Payer: Self-pay | Admitting: Internal Medicine

## 2022-02-04 DIAGNOSIS — G245 Blepharospasm: Secondary | ICD-10-CM | POA: Diagnosis not present

## 2022-02-04 DIAGNOSIS — H02834 Dermatochalasis of left upper eyelid: Secondary | ICD-10-CM | POA: Diagnosis not present

## 2022-02-04 DIAGNOSIS — H02423 Myogenic ptosis of bilateral eyelids: Secondary | ICD-10-CM | POA: Diagnosis not present

## 2022-02-04 DIAGNOSIS — H57813 Brow ptosis, bilateral: Secondary | ICD-10-CM | POA: Diagnosis not present

## 2022-02-04 DIAGNOSIS — H02831 Dermatochalasis of right upper eyelid: Secondary | ICD-10-CM | POA: Diagnosis not present

## 2022-02-15 ENCOUNTER — Ambulatory Visit (INDEPENDENT_AMBULATORY_CARE_PROVIDER_SITE_OTHER): Payer: BC Managed Care – PPO | Admitting: Internal Medicine

## 2022-02-15 VITALS — BP 122/66 | HR 73 | Temp 97.7°F | Ht 63.0 in | Wt 153.0 lb

## 2022-02-15 DIAGNOSIS — R7989 Other specified abnormal findings of blood chemistry: Secondary | ICD-10-CM

## 2022-02-15 DIAGNOSIS — R030 Elevated blood-pressure reading, without diagnosis of hypertension: Secondary | ICD-10-CM

## 2022-02-15 DIAGNOSIS — F322 Major depressive disorder, single episode, severe without psychotic features: Secondary | ICD-10-CM | POA: Diagnosis not present

## 2022-02-15 DIAGNOSIS — E7800 Pure hypercholesterolemia, unspecified: Secondary | ICD-10-CM

## 2022-02-15 DIAGNOSIS — R7302 Impaired glucose tolerance (oral): Secondary | ICD-10-CM | POA: Diagnosis not present

## 2022-02-15 LAB — BASIC METABOLIC PANEL
BUN: 17 mg/dL (ref 6–23)
CO2: 30 mEq/L (ref 19–32)
Calcium: 9.8 mg/dL (ref 8.4–10.5)
Chloride: 98 mEq/L (ref 96–112)
Creatinine, Ser: 1.01 mg/dL (ref 0.40–1.50)
GFR: 82.4 mL/min (ref >60.00)
Glucose, Bld: 110 mg/dL — ABNORMAL HIGH (ref 70–99)
Potassium: 4.5 mEq/L (ref 3.5–5.1)
Sodium: 134 mEq/L — ABNORMAL LOW (ref 135–145)

## 2022-02-15 LAB — HEPATIC FUNCTION PANEL
ALT: 40 U/L (ref 0–53)
AST: 26 U/L (ref 0–37)
Albumin: 4.3 g/dL (ref 3.5–5.2)
Alkaline Phosphatase: 66 U/L (ref 39–117)
Bilirubin, Direct: 0.1 mg/dL (ref 0.0–0.3)
Total Bilirubin: 0.9 mg/dL (ref 0.2–1.2)
Total Protein: 7.9 g/dL (ref 6.0–8.3)

## 2022-02-15 LAB — VITAMIN D 25 HYDROXY (VIT D DEFICIENCY, FRACTURES): VITD: 30.59 ng/mL (ref 30.00–100.00)

## 2022-02-15 LAB — HEMOGLOBIN A1C: Hgb A1c MFr Bld: 6.7 % — ABNORMAL HIGH (ref 4.6–6.5)

## 2022-02-15 LAB — LIPID PANEL
Cholesterol: 263 mg/dL — ABNORMAL HIGH (ref 0–200)
HDL: 61.9 mg/dL (ref >39.00)
LDL Cholesterol: 170 mg/dL — ABNORMAL HIGH (ref 0–99)
NonHDL: 201.04
Total CHOL/HDL Ratio: 4
Triglycerides: 155 mg/dL — ABNORMAL HIGH (ref 0.0–149.0)
VLDL: 31 mg/dL (ref 0.0–40.0)

## 2022-02-15 MED ORDER — CITALOPRAM HYDROBROMIDE 20 MG PO TABS: 20.0000 mg | ORAL_TABLET | Freq: Every day | ORAL | 3 refills | Status: DC
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
## 2022-02-15 NOTE — Patient Instructions (Signed)
You are given the work note as we discussed  Please take all new medication as prescribed - the medication for major depression  Please call if you change your mind about referral for Counseling  Your BP was OK today, so we can continue to follow this  Please continue all other medications as before, and refills have been done if requested.  Please have the pharmacy call with any other refills you may need.  Please continue your efforts at being more active, low cholesterol diet, and weight control.  Please keep your appointments with your specialists as you may have planned  Please go to the LAB at the blood drawing area for the tests to be done  You will be contacted by phone if any changes need to be made immediately.  Otherwise, you will receive a letter about your results with an explanation, but please check with MyChart first.  Please remember to sign up for MyChart if you have not done so, as this will be important to you in the future with finding out test results, communicating by private email, and scheduling acute appointments online when needed.  Please make an Appointment to return in 3 months, or sooner if needed

## 2022-02-17 ENCOUNTER — Encounter: Payer: Self-pay | Admitting: Internal Medicine

## 2022-02-17 DIAGNOSIS — F329 Major depressive disorder, single episode, unspecified: Secondary | ICD-10-CM | POA: Insufficient documentation

## 2022-02-17 NOTE — Assessment & Plan Note (Signed)
Last vitamin D Lab Results  Component Value Date   VD25OH 30.59 02/15/2022   Low, to start oral replacement

## 2022-02-17 NOTE — Assessment & Plan Note (Addendum)
Sudden onset reactive, no HI or SI, ok for 1 mo off work for now, celexa 20 mg qd, declines counseling or referral to psychiatry

## 2022-02-17 NOTE — Assessment & Plan Note (Signed)
Lab Results  Component Value Date   HGBA1C 6.7 (H) 02/15/2022   Mild uncontrolled, pt to continue current lower calorie diet, exercise, declines metformin

## 2022-02-17 NOTE — Assessment & Plan Note (Signed)
I suspect transient reactive due to circumstances - to continue to monitor closely as he does

## 2022-02-17 NOTE — Assessment & Plan Note (Signed)
Lab Results  Component Value Date   LDLCALC 170 (H) 02/15/2022   Severe uncontrolled, goal LDL < 100,  pt to restart crestor 20 mg qd

## 2022-12-21 ENCOUNTER — Encounter: Payer: Self-pay | Admitting: Internal Medicine

## 2022-12-21 ENCOUNTER — Ambulatory Visit (INDEPENDENT_AMBULATORY_CARE_PROVIDER_SITE_OTHER): Payer: BC Managed Care – PPO | Admitting: Internal Medicine

## 2022-12-21 VITALS — BP 120/76 | HR 75 | Temp 98.1°F | Ht 63.0 in | Wt 152.0 lb

## 2022-12-21 DIAGNOSIS — R7302 Impaired glucose tolerance (oral): Secondary | ICD-10-CM | POA: Diagnosis not present

## 2022-12-21 DIAGNOSIS — F322 Major depressive disorder, single episode, severe without psychotic features: Secondary | ICD-10-CM

## 2022-12-21 DIAGNOSIS — E1165 Type 2 diabetes mellitus with hyperglycemia: Secondary | ICD-10-CM | POA: Diagnosis not present

## 2022-12-21 DIAGNOSIS — R7989 Other specified abnormal findings of blood chemistry: Secondary | ICD-10-CM | POA: Diagnosis not present

## 2022-12-21 DIAGNOSIS — Z125 Encounter for screening for malignant neoplasm of prostate: Secondary | ICD-10-CM | POA: Diagnosis not present

## 2022-12-21 DIAGNOSIS — E7800 Pure hypercholesterolemia, unspecified: Secondary | ICD-10-CM | POA: Diagnosis not present

## 2022-12-21 DIAGNOSIS — Z789 Other specified health status: Secondary | ICD-10-CM

## 2022-12-21 DIAGNOSIS — E538 Deficiency of other specified B group vitamins: Secondary | ICD-10-CM | POA: Diagnosis not present

## 2022-12-21 DIAGNOSIS — H538 Other visual disturbances: Secondary | ICD-10-CM

## 2022-12-21 DIAGNOSIS — Z0001 Encounter for general adult medical examination with abnormal findings: Secondary | ICD-10-CM

## 2022-12-21 DIAGNOSIS — Z0000 Encounter for general adult medical examination without abnormal findings: Secondary | ICD-10-CM

## 2022-12-21 DIAGNOSIS — R251 Tremor, unspecified: Secondary | ICD-10-CM

## 2022-12-21 DIAGNOSIS — Z1211 Encounter for screening for malignant neoplasm of colon: Secondary | ICD-10-CM | POA: Diagnosis not present

## 2022-12-21 LAB — CBC WITH DIFFERENTIAL/PLATELET
Basophils Absolute: 0 10*3/uL (ref 0.0–0.1)
Basophils Relative: 0.3 % (ref 0.0–3.0)
Eosinophils Absolute: 0 10*3/uL (ref 0.0–0.7)
Eosinophils Relative: 1.1 % (ref 0.0–5.0)
HCT: 43.5 % (ref 39.0–52.0)
Hemoglobin: 14.5 g/dL (ref 13.0–17.0)
Lymphocytes Relative: 44.9 % (ref 12.0–46.0)
Lymphs Abs: 2 10*3/uL (ref 0.7–4.0)
MCHC: 33.2 g/dL (ref 30.0–36.0)
MCV: 89 fl (ref 78.0–100.0)
Monocytes Absolute: 0.3 10*3/uL (ref 0.1–1.0)
Monocytes Relative: 7.6 % (ref 3.0–12.0)
Neutro Abs: 2.1 10*3/uL (ref 1.4–7.7)
Neutrophils Relative %: 46.1 % (ref 43.0–77.0)
Platelets: 251 10*3/uL (ref 150.0–400.0)
RBC: 4.89 Mil/uL (ref 4.22–5.81)
RDW: 12.6 % (ref 11.5–15.5)
WBC: 4.5 10*3/uL (ref 4.0–10.5)

## 2022-12-21 LAB — VITAMIN D 25 HYDROXY (VIT D DEFICIENCY, FRACTURES): VITD: 20.07 ng/mL — ABNORMAL LOW (ref 30.00–100.00)

## 2022-12-21 LAB — TSH: TSH: 0.93 u[IU]/mL (ref 0.35–5.50)

## 2022-12-21 LAB — BASIC METABOLIC PANEL
BUN: 23 mg/dL (ref 6–23)
CO2: 29 mEq/L (ref 19–32)
Calcium: 9.6 mg/dL (ref 8.4–10.5)
Chloride: 101 mEq/L (ref 96–112)
Creatinine, Ser: 0.92 mg/dL (ref 0.40–1.50)
GFR: 91.62 mL/min (ref >60.00)
Glucose, Bld: 116 mg/dL — ABNORMAL HIGH (ref 70–99)
Potassium: 4.3 mEq/L (ref 3.5–5.1)
Sodium: 136 mEq/L (ref 135–145)

## 2022-12-21 LAB — HEPATIC FUNCTION PANEL
ALT: 24 U/L (ref 0–53)
AST: 18 U/L (ref 0–37)
Albumin: 4.2 g/dL (ref 3.5–5.2)
Alkaline Phosphatase: 86 U/L (ref 39–117)
Bilirubin, Direct: 0.1 mg/dL (ref 0.0–0.3)
Total Bilirubin: 0.8 mg/dL (ref 0.2–1.2)
Total Protein: 7.1 g/dL (ref 6.0–8.3)

## 2022-12-21 LAB — LIPID PANEL
Cholesterol: 273 mg/dL — ABNORMAL HIGH (ref 0–200)
HDL: 61.9 mg/dL (ref >39.00)
LDL Cholesterol: 174 mg/dL — ABNORMAL HIGH (ref 0–99)
NonHDL: 211.14
Total CHOL/HDL Ratio: 4
Triglycerides: 188 mg/dL — ABNORMAL HIGH (ref 0.0–149.0)
VLDL: 37.6 mg/dL (ref 0.0–40.0)

## 2022-12-21 LAB — URINALYSIS, ROUTINE W REFLEX MICROSCOPIC
Bilirubin Urine: NEGATIVE
Hgb urine dipstick: NEGATIVE
Ketones, ur: NEGATIVE
Leukocytes,Ua: NEGATIVE
Nitrite: NEGATIVE
RBC / HPF: NONE SEEN (ref 0–?)
Specific Gravity, Urine: 1.01 (ref 1.000–1.030)
Total Protein, Urine: NEGATIVE
Urine Glucose: NEGATIVE
Urobilinogen, UA: 0.2 (ref 0.0–1.0)
WBC, UA: NONE SEEN (ref 0–?)
pH: 6 (ref 5.0–8.0)

## 2022-12-21 LAB — MICROALBUMIN / CREATININE URINE RATIO
Creatinine,U: 55.1 mg/dL
Microalb Creat Ratio: 1.3 mg/g (ref 0.0–30.0)
Microalb, Ur: <0.7 mg/dL (ref 0.0–1.9)

## 2022-12-21 LAB — HEMOGLOBIN A1C: Hgb A1c MFr Bld: 6.7 % — ABNORMAL HIGH (ref 4.6–6.5)

## 2022-12-21 LAB — PSA: PSA: 0.63 ng/mL (ref 0.10–4.00)

## 2022-12-21 LAB — VITAMIN B12: Vitamin B-12: 503 pg/mL (ref 211–911)

## 2022-12-21 MED ORDER — ZOLPIDEM TARTRATE 10 MG PO TABS: 10.0000 mg | ORAL_TABLET | Freq: Every evening | ORAL | 1 refills | Status: AC | PRN

## 2022-12-21 NOTE — Patient Instructions (Addendum)
Please have your Shingrix (shingles) shots done at your local pharmacy.  Please continue all other medications as before, and refills have been done if requested  - ambien  Please have the pharmacy call with any other refills you may need.  Please continue your efforts at being more active, low cholesterol diet, and weight control.  You are otherwise up to date with prevention measures today.  Please keep your appointments with your specialists as you may have planned  You will be contacted regarding the referral for: colonoscopy, and Neurology at [facility]  Please go to the LAB at the blood drawing area for the tests to be done  You will be contacted by phone if any changes need to be made immediately.  Otherwise, you will receive a letter about your results with an explanation, but please check with MyChart first.  Please make an Appointment to return in 6 months, or sooner if needed

## 2022-12-21 NOTE — Progress Notes (Signed)
Patient ID: Nicolas Cantu, male   DOB: May 11, 1966, 57 y.o.   MRN: [id number]         Chief Complaint:: wellness exam and depression, hld, insomina, blurred vision, hand tremors       HPI:  Nicolas Cantu is a 57 y.o. male here for wellness exam; due for colonoscopy, for shingrix at the pharmacy, o/w up to date                        Also Has had mild worsening depressive symptoms, but no suicidal ideation, or panic; has ongoing anxiety, not increased recently.  Never took the celexa as became wary of it, and also not taking zocor now due to muscle pains that seemed to go away with stopping.  Pt denies chest pain, increased sob or doe, wheezing, orthopnea, PND, increased LE swelling, palpitations, dizziness or syncope.   Pt denies polydipsia, polyuria, or new focal neuro s/s, except for c/o worsening blurred vision despite 3 optho exams where he reports he was told he had no significant eye pathology to explain this, as well as bilat hand tremors and shaking mild intermittent.  Now considers himself disabled, nearing the point he cannot drive or work.  Ambien works well with sleep.      Wt Readings from Last 3 Encounters:  12/21/22 152 lb (68.9 kg)  02/15/22 153 lb (69.4 kg)  08/31/21 161 lb 12.8 oz (73.4 kg)   BP Readings from Last 3 Encounters:  12/21/22 120/76  02/15/22 122/66  08/31/21 118/70   Immunization History  Administered Date(s) Administered   Influenza Split 03/17/2012   Influenza,inj,Quad PF,6+ Mos 03/20/2013, 02/07/2014, 02/11/2015, 04/14/2017, 06/23/2018   PFIZER(Purple Top)SARS-COV-2 Vaccination 10/02/2019, 10/27/2019   Pneumococcal Polysaccharide-23 02/11/2015   Td 06/07/2005   Tdap 02/12/2016   Health Maintenance Due  Topic Date Due   Zoster Vaccines- Shingrix (1 of 2) Never done   Colonoscopy  01/04/2022      Past Medical History:  Diagnosis Date   Abdominal pain    Allergic rhinitis, cause unspecified 02/26/2011   Allergy    Asthma 02/26/2011   Colon polyp     Diabetes mellitus    ?   DIABETES MELLITUS, UNCONTROLLED 02/17/2009   Diarrhea 02/17/2009   HEMATOCHEZIA 02/17/2009   Hepatitis    hx   History of syphilis 08/24/2012   Remote tx   HYPERLIPIDEMIA 02/17/2009   LEG PAIN, BILATERAL 02/17/2009   Lung cancer (HCC) 2013   LUL   Lung mass    OSTEOARTHRITIS, KNEES, BILATERAL 05/14/2010   Polyuria 03/24/2010   Preventative health care 11/15/2010   Shortness of breath    Tuberculosis    hx of tb 93 tx   Past Surgical History:  Procedure Laterality Date   BREATH TEK H PYLORI N/A 04/19/2013   Procedure: BREATH Nelda Bucks;  Surgeon: Beverley Fiedler, MD;  Location: [facility];  Service: Gastroenterology;  Laterality: N/A;   COLONOSCOPY     ESOPHAGOGASTRODUODENOSCOPY     LUNG REMOVAL, PARTIAL  10-18-2011   LUL   POLYPECTOMY     UPPER GASTROINTESTINAL ENDOSCOPY      reports that he has never smoked. He has never used smokeless tobacco. He reports current alcohol use. He reports that he does not use drugs. family history is not on file. Allergies  Allergen Reactions   Atorvastatin Other (See Comments)    Gi pain   Current Outpatient Medications on File Prior to Visit  Medication  Sig Dispense Refill   aspirin 81 MG EC tablet Take 81 mg by mouth daily.     cetirizine (ZYRTEC) 10 MG tablet Take 1 tablet (10 mg total) daily by mouth. 90 tablet 3   No current facility-administered medications on file prior to visit.        ROS:  All others reviewed and negative.  Objective        PE:  BP 120/76 (BP Location: Left Arm, Patient Position: Sitting, Cuff Size: Normal)   Pulse 75   Temp 98.1 F (36.7 C) (Oral)   Ht 5\' 3"  (1.6 m)   Wt 152 lb (68.9 kg)   SpO2 96%   BMI 26.93 kg/m                 Constitutional: Pt appears in NAD               HENT: Head: NCAT.                Right Ear: External ear normal.                 Left Ear: External ear normal.                Eyes: . Pupils are equal, round, and reactive to light. Conjunctivae and  EOM are normal               Nose: without d/c or deformity               Neck: Neck supple. Gross normal ROM               Cardiovascular: Normal rate and regular rhythm.                 Pulmonary/Chest: Effort normal and breath sounds without rales or wheezing.                Abd:  Soft, NT, ND, + BS, no organomegaly               Neurological: Pt is alert. At baseline orientation, motor grossly intact               Skin: Skin is warm. No rashes, no other new lesions, LE edema - none               Psychiatric: Pt behavior is normal without agitation , depressed affect  Micro: none  Cardiac tracings I have personally interpreted today:  none  Pertinent Radiological findings (summarize): none   Lab Results  Component Value Date   WBC 4.5 12/21/2022   HGB 14.5 12/21/2022   HCT 43.5 12/21/2022   PLT 251.0 12/21/2022   GLUCOSE 116 (H) 12/21/2022   CHOL 273 (H) 12/21/2022   TRIG 188.0 (H) 12/21/2022   HDL 61.90 12/21/2022   LDLDIRECT 125.0 08/18/2021   LDLCALC 174 (H) 12/21/2022   ALT 24 12/21/2022   AST 18 12/21/2022   NA 136 12/21/2022   K 4.3 12/21/2022   CL 101 12/21/2022   CREATININE 0.92 12/21/2022   BUN 23 12/21/2022   CO2 29 12/21/2022   TSH 0.93 12/21/2022   PSA 0.63 12/21/2022   INR 0.93 10/15/2011   HGBA1C 6.7 (H) 12/21/2022   MICROALBUR <0.7 12/21/2022   Assessment/Plan:  Nicolas Cantu is a 57 y.o. Asian [4] male with  has a past medical history of Abdominal pain, Allergic rhinitis, cause unspecified (02/26/2011), Allergy, Asthma (02/26/2011), Colon polyp, Diabetes mellitus, DIABETES MELLITUS, UNCONTROLLED (  02/17/2009), Diarrhea (02/17/2009), HEMATOCHEZIA (02/17/2009), Hepatitis, History of syphilis (08/24/2012), HYPERLIPIDEMIA (02/17/2009), LEG PAIN, BILATERAL (02/17/2009), Lung cancer (HCC) (2013), Lung mass, OSTEOARTHRITIS, KNEES, BILATERAL (05/14/2010), Polyuria (03/24/2010), Preventative health care (11/15/2010), Shortness of breath, and Tuberculosis.  Encounter for well  adult exam with abnormal findings Age and sex appropriate education and counseling updated with regular exercise and diet Referrals for preventative services - for colonoscopy Immunizations addressed - for shingrix at pharmacy Smoking counseling  - none needed Evidence for depression or other mood disorder - worsening depression today - declines tx or referral counseling Most recent labs reviewed. I have personally reviewed and have noted: 1) the patient's medical and social history 2) The patient's current medications and supplements 3) The patient's height, weight, and BMI have been recorded in the chart   Hyperlipidemia Lab Results  Component Value Date   LDLCALC 174 (H) 12/21/2022   Uncontrolled, for lower chol diet, declines statin or other zetia, repatha for now  Diabetes [facility]) Lab Results  Component Value Date   HGBA1C 6.7 (H) 12/21/2022   Uncontrolled, declines nutrition referral, to continue DM diet, wt control, consider OHA if A1c becomes over 7 at some point  Low vitamin D level Last vitamin D Lab Results  Component Value Date   VD25OH 20.07 (L) 12/21/2022   Low, to start oral replacement   Major depression Chronic persistent now, declines further tx such as celexa or counseling or psychiatry referral  Blurred vision Etiology unclear with reportedly 3 negative optho exams, continue to follow, pt states will need to stop driving for unclear reason, declines brain MRI  Statin intolerance Pt declines further statin trial  Tremor None by exam today, ? Anxiety, declines neuro referral  Followup: Return in about 6 months (around 06/23/2023).  Oliver Barre, MD 12/25/2022 6:18 AM [facility]

## 2022-12-25 ENCOUNTER — Encounter: Payer: Self-pay | Admitting: Internal Medicine

## 2022-12-25 DIAGNOSIS — Z789 Other specified health status: Secondary | ICD-10-CM | POA: Insufficient documentation

## 2022-12-25 NOTE — Assessment & Plan Note (Addendum)
Etiology unclear with reportedly 3 negative optho exams, continue to follow, pt states will need to stop driving for unclear reason, declines brain MRI

## 2022-12-25 NOTE — Assessment & Plan Note (Signed)
None by exam today, ? Anxiety, declines neuro referral

## 2022-12-25 NOTE — Assessment & Plan Note (Signed)
Pt declines further statin trial

## 2022-12-25 NOTE — Assessment & Plan Note (Signed)
Age and sex appropriate education and counseling updated with regular exercise and diet Referrals for preventative services - for colonoscopy Immunizations addressed - for shingrix at pharmacy Smoking counseling  - none needed Evidence for depression or other mood disorder - worsening depression today - declines tx or referral counseling Most recent labs reviewed. I have personally reviewed and have noted: 1) the patient's medical and social history 2) The patient's current medications and supplements 3) The patient's height, weight, and BMI have been recorded in the chart

## 2022-12-25 NOTE — Assessment & Plan Note (Signed)
Lab Results  Component Value Date   LDLCALC 174 (H) 12/21/2022   Uncontrolled, for lower chol diet, declines statin or other zetia, repatha for now

## 2022-12-25 NOTE — Assessment & Plan Note (Signed)
Chronic persistent now, declines further tx such as celexa or counseling or psychiatry referral

## 2022-12-25 NOTE — Assessment & Plan Note (Signed)
Lab Results  Component Value Date   HGBA1C 6.7 (H) 12/21/2022   Uncontrolled, declines nutrition referral, to continue DM diet, wt control, consider OHA if A1c becomes over 7 at some point

## 2022-12-25 NOTE — Assessment & Plan Note (Signed)
Last vitamin D Lab Results  Component Value Date   VD25OH 20.07 (L) 12/21/2022   Low, to start oral replacement
